# Patient Record
Sex: Female | Born: 1975 | Race: White | Hispanic: No | Marital: Single | Smoking: Never smoker
Health system: Southern US, Community
[De-identification: ages and names within clinical notes are randomized; demographics above are authoritative.]

## PROBLEM LIST (undated history)

## (undated) DIAGNOSIS — F419 Anxiety disorder, unspecified: Secondary | ICD-10-CM

## (undated) DIAGNOSIS — G4730 Sleep apnea, unspecified: Secondary | ICD-10-CM

## (undated) DIAGNOSIS — G43909 Migraine, unspecified, not intractable, without status migrainosus: Secondary | ICD-10-CM

## (undated) DIAGNOSIS — K219 Gastro-esophageal reflux disease without esophagitis: Secondary | ICD-10-CM

## (undated) DIAGNOSIS — K529 Noninfective gastroenteritis and colitis, unspecified: Secondary | ICD-10-CM

## (undated) DIAGNOSIS — K859 Acute pancreatitis without necrosis or infection, unspecified: Secondary | ICD-10-CM

## (undated) DIAGNOSIS — F32A Depression, unspecified: Secondary | ICD-10-CM

## (undated) DIAGNOSIS — F329 Major depressive disorder, single episode, unspecified: Secondary | ICD-10-CM

## (undated) HISTORY — DX: Anxiety disorder, unspecified: F41.9

## (undated) HISTORY — DX: Gastro-esophageal reflux disease without esophagitis: K21.9

## (undated) HISTORY — DX: Migraine, unspecified, not intractable, without status migrainosus: G43.909

## (undated) HISTORY — PX: CHOLECYSTECTOMY: SHX55

## (undated) HISTORY — DX: Noninfective gastroenteritis and colitis, unspecified: K52.9

## (undated) HISTORY — DX: Major depressive disorder, single episode, unspecified: F32.9

## (undated) HISTORY — PX: TUBAL LIGATION: SHX77

## (undated) HISTORY — DX: Sleep apnea, unspecified: G47.30

## (undated) HISTORY — DX: Depression, unspecified: F32.A

---

## 2001-12-13 ENCOUNTER — Encounter: Payer: Self-pay | Admitting: Internal Medicine

## 2001-12-13 ENCOUNTER — Inpatient Hospital Stay (HOSPITAL_COMMUNITY): Admission: EM | Admit: 2001-12-13 | Discharge: 2001-12-14 | Payer: Self-pay | Admitting: Emergency Medicine

## 2001-12-14 ENCOUNTER — Inpatient Hospital Stay (HOSPITAL_COMMUNITY): Admission: EM | Admit: 2001-12-14 | Discharge: 2001-12-17 | Payer: Self-pay | Admitting: *Deleted

## 2002-01-02 ENCOUNTER — Encounter: Admission: RE | Admit: 2002-01-02 | Discharge: 2002-01-02 | Payer: Self-pay | Admitting: *Deleted

## 2002-02-15 ENCOUNTER — Encounter: Admission: RE | Admit: 2002-02-15 | Discharge: 2002-02-15 | Payer: Self-pay | Admitting: *Deleted

## 2002-05-17 ENCOUNTER — Encounter: Admission: RE | Admit: 2002-05-17 | Discharge: 2002-05-17 | Payer: Self-pay | Admitting: *Deleted

## 2003-10-29 ENCOUNTER — Other Ambulatory Visit: Admission: RE | Admit: 2003-10-29 | Discharge: 2003-10-29 | Payer: Self-pay | Admitting: Obstetrics and Gynecology

## 2005-01-07 ENCOUNTER — Encounter: Admission: RE | Admit: 2005-01-07 | Discharge: 2005-01-07 | Payer: Self-pay | Admitting: Family Medicine

## 2006-02-20 ENCOUNTER — Ambulatory Visit (HOSPITAL_COMMUNITY): Payer: Self-pay | Admitting: Psychiatry

## 2006-03-10 ENCOUNTER — Ambulatory Visit (HOSPITAL_COMMUNITY): Payer: Self-pay | Admitting: Psychiatry

## 2013-01-02 ENCOUNTER — Ambulatory Visit: Payer: Self-pay | Admitting: Obstetrics and Gynecology

## 2014-01-22 ENCOUNTER — Ambulatory Visit (INDEPENDENT_AMBULATORY_CARE_PROVIDER_SITE_OTHER): Payer: BC Managed Care – PPO | Admitting: Psychology

## 2014-01-22 DIAGNOSIS — F411 Generalized anxiety disorder: Secondary | ICD-10-CM

## 2014-01-22 DIAGNOSIS — F329 Major depressive disorder, single episode, unspecified: Secondary | ICD-10-CM

## 2014-01-22 DIAGNOSIS — F3289 Other specified depressive episodes: Secondary | ICD-10-CM

## 2014-01-23 ENCOUNTER — Ambulatory Visit (HOSPITAL_COMMUNITY): Payer: Self-pay | Admitting: Psychology

## 2014-01-29 ENCOUNTER — Encounter (HOSPITAL_COMMUNITY): Payer: Self-pay | Admitting: Psychology
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
## 2014-02-11 ENCOUNTER — Encounter (HOSPITAL_COMMUNITY): Payer: Self-pay | Admitting: Psychology

## 2014-02-11 ENCOUNTER — Ambulatory Visit (INDEPENDENT_AMBULATORY_CARE_PROVIDER_SITE_OTHER): Payer: BC Managed Care – PPO | Admitting: Psychology

## 2014-02-11 DIAGNOSIS — F411 Generalized anxiety disorder: Secondary | ICD-10-CM | POA: Insufficient documentation

## 2014-02-11 NOTE — Progress Notes (Signed)
   PROGRESS NOTE  Patient:  Tonya Love   DOB: 27-Mar-1976  MR Number: [id number]  Location: BEHAVIORAL [facility] [address] [place] [address] Dept: [phone]  Start: 10 AM End: 11 AM  Provider/Observer:     Hershal Coria PSYD  Chief Complaint:      Chief Complaint  Patient presents with  . Anxiety  . Depression    Reason For Service:    The patient was self referred because of issues of stress and the reduction of anxiety and depressive symptoms. She reports that she feels like everything in her life distressing her and there is a lot of stress do to life changes at home and at work. The patient reports that she is not very happy in her marriage and her home life. She reports that she and her husband have been together for 13 years and have been married for the past 5 years. She is knowledge is and has told him about several extramarital affairs. They have address this the best they can talk about various options to deal with this but she reports that when she gets anxious or depressed she ends up getting involved in extramarital relationships.      Interventions Strategy:  Cognitive/behavioral psychotherapeutic interventions  Participation Level:   Active  Participation Quality:  Appropriate      Behavioral Observation:  Well Groomed, Alert, and Appropriate.   Current Psychosocial Factors: The patient reports that her husband doing little bit better recently and that he has been more attentive. The patient reports that she does not really know why he is making changes other than he is worried about her bleeding.  Content of Session:   Reviewed current symptoms and continue to work on therapeutic interventions or issues related to anxiety and depressive symptoms. These include but foundational issues such as diet physical activity and sleep patterns as well as working on  cognitive/behavioral interventions around building better coping resources.  Current Status:   The patient reports that her anxiety little bit better recently but she continues to work 2 jobs and works nearly 90 hours a week.  Patient Progress:   Stable  Target Goals:   Target goals include reducing the intensity, severity, and duration of issues of anxiety and depression.  Last Reviewed:   02/11/2014  Goals Addressed Today:    Today we worked on coping skills around thinking about and dealing with life stressors and more effectively and simply avoiding.  Impression/Diagnosis:  The patient has a history of depression and anxiety as well as disruptive behaviors. She denies any particular manic episodes and does appear that anxiety and depression the primary symptoms.   Diagnosis:    Axis I: Anxiety state, unspecified   RODENBOUGH,JOHN R, PsyD 02/11/2014

## 2014-03-05 ENCOUNTER — Ambulatory Visit (HOSPITAL_COMMUNITY): Payer: Self-pay | Admitting: Psychology

## 2014-04-15 ENCOUNTER — Ambulatory Visit (INDEPENDENT_AMBULATORY_CARE_PROVIDER_SITE_OTHER): Payer: BC Managed Care – PPO | Admitting: Psychology

## 2014-04-15 DIAGNOSIS — F411 Generalized anxiety disorder: Secondary | ICD-10-CM

## 2014-04-16 ENCOUNTER — Encounter (HOSPITAL_COMMUNITY): Payer: Self-pay | Admitting: Psychology
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
## 2014-05-08 ENCOUNTER — Ambulatory Visit (HOSPITAL_COMMUNITY): Payer: Self-pay | Admitting: Psychology

## 2014-05-12 ENCOUNTER — Ambulatory Visit (INDEPENDENT_AMBULATORY_CARE_PROVIDER_SITE_OTHER): Payer: BC Managed Care – PPO | Admitting: Psychology

## 2014-05-12 DIAGNOSIS — F411 Generalized anxiety disorder: Secondary | ICD-10-CM

## 2014-06-30 ENCOUNTER — Encounter (HOSPITAL_COMMUNITY): Payer: Self-pay | Admitting: Psychology
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
## 2014-12-25 ENCOUNTER — Encounter (HOSPITAL_COMMUNITY): Payer: Self-pay | Admitting: Emergency Medicine

## 2014-12-25 ENCOUNTER — Emergency Department (HOSPITAL_COMMUNITY): Payer: BLUE CROSS/BLUE SHIELD

## 2014-12-25 ENCOUNTER — Emergency Department (HOSPITAL_COMMUNITY)
Admission: EM | Admit: 2014-12-25 | Discharge: 2014-12-25 | Disposition: A | Discharge status: Home / Self Care | Payer: BLUE CROSS/BLUE SHIELD | Attending: Emergency Medicine | Admitting: Emergency Medicine

## 2014-12-25 DIAGNOSIS — K5712 Diverticulitis of small intestine without perforation or abscess without bleeding: Secondary | ICD-10-CM

## 2014-12-25 DIAGNOSIS — Z8659 Personal history of other mental and behavioral disorders: Secondary | ICD-10-CM | POA: Diagnosis not present

## 2014-12-25 DIAGNOSIS — R1030 Lower abdominal pain, unspecified: Secondary | ICD-10-CM

## 2014-12-25 DIAGNOSIS — Z3202 Encounter for pregnancy test, result negative: Secondary | ICD-10-CM | POA: Insufficient documentation

## 2014-12-25 LAB — URINALYSIS, ROUTINE W REFLEX MICROSCOPIC
Bilirubin Urine: NEGATIVE
Glucose, UA: NEGATIVE mg/dL
Hgb urine dipstick: NEGATIVE
Ketones, ur: NEGATIVE mg/dL
Leukocytes, UA: NEGATIVE
Nitrite: NEGATIVE
Protein, ur: 30 mg/dL — AB
Specific Gravity, Urine: 1.015 (ref 1.005–1.030)
Urobilinogen, UA: 0.2 mg/dL (ref 0.0–1.0)
pH: 8.5 — ABNORMAL HIGH (ref 5.0–8.0)

## 2014-12-25 LAB — COMPREHENSIVE METABOLIC PANEL
ALT: 25 U/L (ref 0–35)
AST: 27 U/L (ref 0–37)
Albumin: 3.9 g/dL (ref 3.5–5.2)
Alkaline Phosphatase: 87 U/L (ref 39–117)
Anion gap: 7 (ref 5–15)
BUN: 9 mg/dL (ref 6–23)
CO2: 27 mmol/L (ref 19–32)
Calcium: 9.1 mg/dL (ref 8.4–10.5)
Chloride: 103 mmol/L (ref 96–112)
Creatinine, Ser: 0.88 mg/dL (ref 0.50–1.10)
GFR calc Af Amer: >90 mL/min (ref >90)
GFR calc non Af Amer: 83 mL/min — ABNORMAL LOW (ref >90)
Glucose, Bld: 105 mg/dL — ABNORMAL HIGH (ref 70–99)
Potassium: 4 mmol/L (ref 3.5–5.1)
Sodium: 137 mmol/L (ref 135–145)
Total Bilirubin: 1 mg/dL (ref 0.3–1.2)
Total Protein: 7.4 g/dL (ref 6.0–8.3)

## 2014-12-25 LAB — CBC WITH DIFFERENTIAL/PLATELET
Basophils Absolute: 0 10*3/uL (ref 0.0–0.1)
Basophils Relative: 0 % (ref 0–1)
Eosinophils Absolute: 0.1 10*3/uL (ref 0.0–0.7)
Eosinophils Relative: 0 % (ref 0–5)
HCT: 33.3 % — ABNORMAL LOW (ref 36.0–46.0)
Hemoglobin: 10.9 g/dL — ABNORMAL LOW (ref 12.0–15.0)
Lymphocytes Relative: 12 % (ref 12–46)
Lymphs Abs: 1.7 10*3/uL (ref 0.7–4.0)
MCH: 31.6 pg (ref 26.0–34.0)
MCHC: 32.7 g/dL (ref 30.0–36.0)
MCV: 96.5 fL (ref 78.0–100.0)
Monocytes Absolute: 1 10*3/uL (ref 0.1–1.0)
Monocytes Relative: 7 % (ref 3–12)
Neutro Abs: 11.1 10*3/uL — ABNORMAL HIGH (ref 1.7–7.7)
Neutrophils Relative %: 81 % — ABNORMAL HIGH (ref 43–77)
Platelets: 320 10*3/uL (ref 150–400)
RBC: 3.45 MIL/uL — ABNORMAL LOW (ref 3.87–5.11)
RDW: 12.1 % (ref 11.5–15.5)
WBC: 13.8 10*3/uL — ABNORMAL HIGH (ref 4.0–10.5)

## 2014-12-25 LAB — URINE MICROSCOPIC-ADD ON

## 2014-12-25 LAB — PREGNANCY, URINE: PREG TEST UR: NEGATIVE

## 2014-12-25 MED ORDER — METRONIDAZOLE IN NACL 5-0.79 MG/ML-% IV SOLN
500.0000 mg | Freq: Once | INTRAVENOUS | Status: AC
  Administered 2014-12-25: 500 mg via INTRAVENOUS
  Filled 2014-12-25: qty 100

## 2014-12-25 MED ORDER — SODIUM CHLORIDE 0.9 % IV BOLUS (SEPSIS)
1000.0000 mL | Freq: Once | INTRAVENOUS | Status: AC
  Administered 2014-12-25: 1000 mL via INTRAVENOUS

## 2014-12-25 MED ORDER — ONDANSETRON HCL 4 MG/2ML IJ SOLN
4.0000 mg | Freq: Once | INTRAMUSCULAR | Status: AC
  Administered 2014-12-25: 4 mg via INTRAVENOUS
  Filled 2014-12-25: qty 2

## 2014-12-25 MED ORDER — OXYCODONE-ACETAMINOPHEN 5-325 MG PO TABS: 1.0000 | ORAL_TABLET | ORAL | Status: DC | PRN

## 2014-12-25 MED ORDER — MORPHINE SULFATE 4 MG/ML IJ SOLN
4.0000 mg | Freq: Once | INTRAMUSCULAR | Status: AC
  Administered 2014-12-25: 4 mg via INTRAVENOUS
  Filled 2014-12-25: qty 1

## 2014-12-25 MED ORDER — IOHEXOL 300 MG/ML  SOLN
100.0000 mL | Freq: Once | INTRAMUSCULAR | Status: AC | PRN
  Administered 2014-12-25: 100 mL via INTRAVENOUS

## 2014-12-25 MED ORDER — CIPROFLOXACIN IN D5W 400 MG/200ML IV SOLN
400.0000 mg | Freq: Once | INTRAVENOUS | Status: AC
  Administered 2014-12-25: 400 mg via INTRAVENOUS
  Filled 2014-12-25: qty 200

## 2014-12-25 MED ORDER — ONDANSETRON HCL 8 MG PO TABS
8.0000 mg | ORAL_TABLET | ORAL | Status: DC | PRN
Start: 2014-12-25 — End: 2019-11-04

## 2014-12-25 MED ORDER — CIPROFLOXACIN HCL 500 MG PO TABS: 500.0000 mg | ORAL_TABLET | Freq: Two times a day (BID) | ORAL | Status: DC

## 2014-12-25 MED ORDER — METRONIDAZOLE 500 MG PO TABS: 500.0000 mg | ORAL_TABLET | Freq: Four times a day (QID) | ORAL | Status: DC

## 2014-12-25 MED ORDER — CEFTRIAXONE SODIUM 1 G IJ SOLR
1.0000 g | Freq: Once | INTRAMUSCULAR | Status: AC
  Administered 2014-12-25: 1 g via INTRAVENOUS
  Filled 2014-12-25: qty 10

## 2014-12-25 MED ORDER — IOHEXOL 300 MG/ML  SOLN
50.0000 mL | Freq: Once | INTRAMUSCULAR | Status: AC | PRN
  Administered 2014-12-25: 50 mL via ORAL

## 2014-12-25 NOTE — ED Provider Notes (Signed)
CSN: [id number]     Arrival date & time 12/25/14  0707 History  This chart was scribed for Tonya HutchingBrian Sama Arauz, MD by Carl Bestelina Holson, ED Scribe. This patient was seen in room APA05/APA05 and the patient's care was started at 8:03 AM.    Chief Complaint  Patient presents with  . Abdominal Pain    Patient is a 39 y.o. female presenting with abdominal pain. The history is provided by the patient. No language interpreter was used.  Abdominal Pain Associated symptoms: no vaginal bleeding    HPI Comments: Tonya Love is a 39 y.o. female with a history of ovarian cysts who presents to the Emergency Department complaining of constant bilateral lower abdominal pain that started last night.  When she woke up this morning her pain had worsened and she felt shaky.  She states that she went into the shower and remembers her aunt waking her up stating that she had passed out.  She ate yesterday at lunch and had a snack last night.  Her LNMP was in February and she has a history of tubal ligation.  She denies vaginal bleeding as an associated symptom.  She had a transvaginal u/s recently which revealed ovarian cysts.  [facility]. Pricilla [facility]ssonBuester at [facility] [place] gave her birth control pills in an attempt to shrink the cysts.  She does not have a history of health problems and does not take any medication chronically.  She works as a Therapist, [employer] receiver.    Past Medical History  Diagnosis Date  . Anxiety   . Depression    Past Surgical History  Procedure Laterality Date  . Cholecystectomy     Family History  Problem Relation Age of Onset  . Depression Maternal Aunt    History  Substance Use Topics  . Smoking status: Never Smoker   . Smokeless tobacco: Never Used  . Alcohol Use: No   OB History    Gravida Para Term Preterm AB TAB SAB Ectopic Multiple Living            2     Review of Systems  Gastrointestinal: Positive for abdominal pain.  Genitourinary: Negative for vaginal bleeding.  Neurological: Positive for  syncope.  All other systems reviewed and are negative.    Allergies  Codeine  Home Medications   Prior to Admission medications   Medication Sig Start Date End Date Taking? Authorizing Provider  ibuprofen (ADVIL,MOTRIN) 200 MG tablet Take 600 mg by mouth every 6 (six) hours as needed for headache.   Yes Historical Provider, MD  ciprofloxacin (CIPRO) 500 MG tablet Take 1 tablet (500 mg total) by mouth 2 (two) times daily. 12/25/14   Tonya HutchingBrian Emerick Weatherly, MD  metroNIDAZOLE (FLAGYL) 500 MG tablet Take 1 tablet (500 mg total) by mouth 4 (four) times daily. 12/25/14   Tonya HutchingBrian Tajanay Hurley, MD  ondansetron (ZOFRAN) 8 MG tablet Take 1 tablet (8 mg total) by mouth every 4 (four) hours as needed. 12/25/14   Tonya HutchingBrian Dreanna Kyllo, MD  oxyCODONE-acetaminophen (PERCOCET) 5-325 MG per tablet Take 1-2 tablets by mouth every 4 (four) hours as needed. 12/25/14   Tonya HutchingBrian Eunique Balik, MD   Triage Vitals: BP 125/72 mmHg  Pulse 95  Temp(Src) 98.7 F (37.1 C) (Oral)  Resp 18  Ht 5\' 2"  (1.575 m)  Wt 144 lb (65.318 kg)  BMI 26.33 kg/m2  SpO2 96%  LMP 11/12/2014  Physical Exam  Constitutional: She is oriented to person, place, and time. She appears well-developed and well-nourished.  HENT:  Head: Normocephalic  and atraumatic.  Eyes: Conjunctivae and EOM are normal. Pupils are equal, round, and reactive to light.  Neck: Normal range of motion. Neck supple.  Cardiovascular: Normal rate and regular rhythm.   Pulmonary/Chest: Effort normal and breath sounds normal.  Abdominal: Soft. Bowel sounds are normal. There is tenderness.  Minimal lower abdominal tenderness.    Musculoskeletal: Normal range of motion.  Neurological: She is alert and oriented to person, place, and time.  Skin: Skin is warm and dry.  Psychiatric: She has a normal mood and affect. Her behavior is normal.  Nursing note and vitals reviewed.   ED Course  Procedures (including critical care time)  DIAGNOSTIC STUDIES: Oxygen Saturation is 96% on room air, adequate by my  interpretation.    COORDINATION OF CARE: 8:08 AM- Will obtain an EKG, UA, and blood work and the patient agreed to the treatment plan.   Labs Review Labs Reviewed  URINALYSIS, ROUTINE W REFLEX MICROSCOPIC - Abnormal; Notable for the following:    APPearance HAZY (*)    pH 8.5 (*)    Protein, ur 30 (*)    All other components within normal limits  CBC WITH DIFFERENTIAL/PLATELET - Abnormal; Notable for the following:    WBC 13.8 (*)    RBC 3.45 (*)    Hemoglobin 10.9 (*)    HCT 33.3 (*)    Neutrophils Relative % 81 (*)    Neutro Abs 11.1 (*)    All other components within normal limits  COMPREHENSIVE METABOLIC PANEL - Abnormal; Notable for the following:    Glucose, Bld 105 (*)    GFR calc non Af Amer 83 (*)    All other components within normal limits  URINE MICROSCOPIC-ADD ON - Abnormal; Notable for the following:    Squamous Epithelial / LPF MANY (*)    Bacteria, UA MANY (*)    All other components within normal limits  URINE CULTURE  PREGNANCY, URINE    Imaging Review Ct Abdomen Pelvis W Contrast  12/25/2014   CLINICAL DATA:  Bilateral lower abdominal and pelvic pain, elevated white count  EXAM: CT ABDOMEN AND PELVIS WITH CONTRAST  TECHNIQUE: Multidetector CT imaging of the abdomen and pelvis was performed using the standard protocol following bolus administration of intravenous contrast.  CONTRAST:  50mL OMNIPAQUE IOHEXOL 300 MG/ML SOLN, OMNIPAQUE IOHEXOL 300 MG/ML SOLN  COMPARISON:  None.  FINDINGS: Lower chest: Minimal bibasilar atelectasis. Lung bases otherwise clear. Normal heart size. No pericardial or pleural effusion. No hiatal hernia.  Abdomen: Prior cholecystectomy. Liver, biliary system, pancreas, spleen, adrenal glands, and kidneys are within normal limits for age and demonstrate no acute finding.  Negative for bowel obstruction, dilatation, ileus, or free air.  No abdominal free fluid, fluid collection, hemorrhage, abscess, or adenopathy.  Normal aorta.  Negative  for aneurysm.  Normal appendix demonstrated.  Pelvis: Anterior pelvic edema and inflammation about the sigmoid colon. There is diffuse sigmoid diverticular change. Findings compatible with acute sigmoid diverticulitis. Small amount of pelvic free fluid. No fluid collection. No associated obstruction or definite free air. Uterus and adnexal normal in size. Endometrial cavity free fluid noted, nonspecific. Urinary bladder unremarkable. No pelvic adenopathy, inguinal abnormality, or hernia.  Degenerative disc disease and vacuum disc phenomenon at L5-S1. No acute osseous finding.  IMPRESSION: Acute sigmoid diverticulitis within the anterior pelvic midline.  Small amount of pelvic free fluid likely reactive.  No associated perforation, obstruction, or abscess   Electronically Signed   By: Judie Petit.  Shick M.D.  On: 12/25/2014 12:34     EKG Interpretation   Date/Time:  Thursday December 25 2014 08:06:36 EDT Ventricular Rate:  94 PR Interval:  155 QRS Duration: 92 QT Interval:  340 QTC Calculation: 425 R Axis:   81 Text Interpretation:  Sinus rhythm RSR' in V1 or V2, probably normal  variant Baseline wander in lead(s) V2 Confirmed by Averill Pons  MD, Rogen Porte ([id number])  on 12/25/2014 8:33:24 AM      MDM   Final diagnoses:  Lower abdominal pain  Diverticulitis of small intestine without perforation or abscess without bleeding    CT scan of abdomen/pelvis reveals sigmoid diverticulitis. No perforation or abscess. Rx IV Cipro, IV Flagyl. Discharge medications oral Cipro, oral Flagyl, Percocet, Zofran 8 mg. Discussed findings with the patient, her husband, her mother. She understands to return if worse.  I personally performed the services described in this documentation, which was scribed in my presence. The recorded information has been reviewed and is accurate.    Tonya Hutching, MD 12/25/14 [phone]

## 2014-12-25 NOTE — ED Notes (Signed)
PT c/o lower abdominal pain with increased urinary frequency and nausea but denies any vomiting or diarrhea. PT also states a syncopal episode this am in the shower and states she thinks she hit her head.

## 2014-12-25 NOTE — Discharge Instructions (Signed)
CT scan shows diverticulitis. Prescriptions for 2 antibiotics. Also medication for pain and nausea. Clear liquids for next 24 hours. Return if worse.

## 2014-12-26 LAB — URINE CULTURE: Colony Count: 70000

## 2015-01-23 ENCOUNTER — Encounter (HOSPITAL_COMMUNITY): Payer: Self-pay

## 2015-01-23 ENCOUNTER — Emergency Department (HOSPITAL_COMMUNITY)
Admission: EM | Admit: 2015-01-23 | Discharge: 2015-01-23 | Disposition: A | Discharge status: Home / Self Care | Payer: BLUE CROSS/BLUE SHIELD | Attending: Emergency Medicine | Admitting: Emergency Medicine

## 2015-01-23 DIAGNOSIS — Z792 Long term (current) use of antibiotics: Secondary | ICD-10-CM | POA: Diagnosis not present

## 2015-01-23 DIAGNOSIS — R1032 Left lower quadrant pain: Secondary | ICD-10-CM | POA: Diagnosis present

## 2015-01-23 DIAGNOSIS — Z8659 Personal history of other mental and behavioral disorders: Secondary | ICD-10-CM | POA: Insufficient documentation

## 2015-01-23 DIAGNOSIS — Z9049 Acquired absence of other specified parts of digestive tract: Secondary | ICD-10-CM | POA: Insufficient documentation

## 2015-01-23 DIAGNOSIS — K5792 Diverticulitis of intestine, part unspecified, without perforation or abscess without bleeding: Secondary | ICD-10-CM | POA: Diagnosis not present

## 2015-01-23 DIAGNOSIS — Z3202 Encounter for pregnancy test, result negative: Secondary | ICD-10-CM | POA: Diagnosis not present

## 2015-01-23 HISTORY — DX: Acute pancreatitis without necrosis or infection, unspecified: K85.90

## 2015-01-23 LAB — CBC WITH DIFFERENTIAL/PLATELET
Basophils Absolute: 0 10*3/uL (ref 0.0–0.1)
Basophils Relative: 0 % (ref 0–1)
EOS PCT: 1 % (ref 0–5)
Eosinophils Absolute: 0.1 10*3/uL (ref 0.0–0.7)
HCT: 31.2 % — ABNORMAL LOW (ref 36.0–46.0)
HEMOGLOBIN: 10.1 g/dL — AB (ref 12.0–15.0)
LYMPHS ABS: 3.1 10*3/uL (ref 0.7–4.0)
LYMPHS PCT: 27 % (ref 12–46)
MCH: 31.6 pg (ref 26.0–34.0)
MCHC: 32.4 g/dL (ref 30.0–36.0)
MCV: 97.5 fL (ref 78.0–100.0)
MONO ABS: 0.6 10*3/uL (ref 0.1–1.0)
Monocytes Relative: 6 % (ref 3–12)
Neutro Abs: 7.8 10*3/uL — ABNORMAL HIGH (ref 1.7–7.7)
Neutrophils Relative %: 66 % (ref 43–77)
PLATELETS: 291 10*3/uL (ref 150–400)
RBC: 3.2 MIL/uL — AB (ref 3.87–5.11)
RDW: 12.5 % (ref 11.5–15.5)
WBC: 11.6 10*3/uL — AB (ref 4.0–10.5)

## 2015-01-23 LAB — COMPREHENSIVE METABOLIC PANEL
ALT: 23 U/L (ref 14–54)
ANION GAP: 7 (ref 5–15)
AST: 22 U/L (ref 15–41)
Albumin: 3.8 g/dL (ref 3.5–5.0)
Alkaline Phosphatase: 58 U/L (ref 38–126)
BILIRUBIN TOTAL: 0.5 mg/dL (ref 0.3–1.2)
BUN: 11 mg/dL (ref 6–20)
CO2: 26 mmol/L (ref 22–32)
CREATININE: 0.8 mg/dL (ref 0.44–1.00)
Calcium: 8.7 mg/dL — ABNORMAL LOW (ref 8.9–10.3)
Chloride: 106 mmol/L (ref 101–111)
GFR calc Af Amer: >60 mL/min (ref >60)
GFR calc non Af Amer: >60 mL/min (ref >60)
Glucose, Bld: 102 mg/dL — ABNORMAL HIGH (ref 70–99)
Potassium: 2.9 mmol/L — ABNORMAL LOW (ref 3.5–5.1)
Sodium: 139 mmol/L (ref 135–145)
Total Protein: 6.7 g/dL (ref 6.5–8.1)

## 2015-01-23 LAB — URINE MICROSCOPIC-ADD ON

## 2015-01-23 LAB — URINALYSIS, ROUTINE W REFLEX MICROSCOPIC
Bilirubin Urine: NEGATIVE
GLUCOSE, UA: NEGATIVE mg/dL
Ketones, ur: NEGATIVE mg/dL
Leukocytes, UA: NEGATIVE
NITRITE: NEGATIVE
PH: 6.5 (ref 5.0–8.0)
Protein, ur: NEGATIVE mg/dL
Specific Gravity, Urine: 1.015 (ref 1.005–1.030)
Urobilinogen, UA: 0.2 mg/dL (ref 0.0–1.0)

## 2015-01-23 LAB — PREGNANCY, URINE: PREG TEST UR: NEGATIVE

## 2015-01-23 MED ORDER — HYDROCODONE-ACETAMINOPHEN 5-325 MG PO TABS: 2.0000 | ORAL_TABLET | ORAL | Status: DC | PRN

## 2015-01-23 MED ORDER — CIPROFLOXACIN HCL 500 MG PO TABS: 500.0000 mg | ORAL_TABLET | Freq: Two times a day (BID) | ORAL | Status: DC

## 2015-01-23 MED ORDER — CIPROFLOXACIN HCL 250 MG PO TABS
500.0000 mg | ORAL_TABLET | Freq: Once | ORAL | Status: AC
  Administered 2015-01-23: 500 mg via ORAL
  Filled 2015-01-23: qty 2

## 2015-01-23 MED ORDER — METRONIDAZOLE 500 MG PO TABS
500.0000 mg | ORAL_TABLET | Freq: Once | ORAL | Status: AC
  Administered 2015-01-23: 500 mg via ORAL
  Filled 2015-01-23: qty 1

## 2015-01-23 MED ORDER — METRONIDAZOLE 500 MG PO TABS: 500.0000 mg | ORAL_TABLET | Freq: Three times a day (TID) | ORAL | Status: DC

## 2015-01-23 NOTE — Discharge Instructions (Signed)
Cipro and Flagyl as prescribed.  Hydrocodone as prescribed as needed for pain.  Return to the ER for worsening pain, high fever, bloody stools, or other new and concerning symptoms.   Diverticulitis Diverticulitis is inflammation or infection of small pouches in your colon that form when you have a condition called diverticulosis. The pouches in your colon are called diverticula. Your colon, or large intestine, is where water is absorbed and stool is formed. Complications of diverticulitis can include:  Bleeding.  Severe infection.  Severe pain.  Perforation of your colon.  Obstruction of your colon. CAUSES  Diverticulitis is caused by bacteria. Diverticulitis happens when stool becomes trapped in diverticula. This allows bacteria to grow in the diverticula, which can lead to inflammation and infection. RISK FACTORS People with diverticulosis are at risk for diverticulitis. Eating a diet that does not include enough fiber from fruits and vegetables may make diverticulitis more likely to develop. SYMPTOMS  Symptoms of diverticulitis may include:  Abdominal pain and tenderness. The pain is normally located on the left side of the abdomen, but may occur in other areas.  Fever and chills.  Bloating.  Cramping.  Nausea.  Vomiting.  Constipation.  Diarrhea.  Blood in your stool. DIAGNOSIS  Your health care provider will ask you about your medical history and do a physical exam. You may need to have tests done because many medical conditions can cause the same symptoms as diverticulitis. Tests may include:  Blood tests.  Urine tests.  Imaging tests of the abdomen, including X-rays and CT scans. When your condition is under control, your health care provider may recommend that you have a colonoscopy. A colonoscopy can show how severe your diverticula are and whether something else is causing your symptoms. TREATMENT  Most cases of diverticulitis are mild and can be  treated at home. Treatment may include:  Taking over-the-counter pain medicines.  Following a clear liquid diet.  Taking antibiotic medicines by mouth for 7-10 days. More severe cases may be treated at a hospital. Treatment may include:  Not eating or drinking.  Taking prescription pain medicine.  Receiving antibiotic medicines through an IV tube.  Receiving fluids and nutrition through an IV tube.  Surgery. HOME CARE INSTRUCTIONS   Follow your health care provider's instructions carefully.  Follow a full liquid diet or other diet as directed by your health care provider. After your symptoms improve, your health care provider may tell you to change your diet. He or she may recommend you eat a high-fiber diet. Fruits and vegetables are good sources of fiber. Fiber makes it easier to pass stool.  Take fiber supplements or probiotics as directed by your health care provider.  Only take medicines as directed by your health care provider.  Keep all your follow-up appointments. SEEK MEDICAL CARE IF:   Your pain does not improve.  You have a hard time eating food.  Your bowel movements do not return to normal. SEEK IMMEDIATE MEDICAL CARE IF:   Your pain becomes worse.  Your symptoms do not get better.  Your symptoms suddenly get worse.  You have a fever.  You have repeated vomiting.  You have bloody or black, tarry stools. MAKE SURE YOU:   Understand these instructions.  Will watch your condition.  Will get help right away if you are not doing well or get worse. Document Released: 06/15/2005 Document Revised: 09/10/2013 Document Reviewed: 07/31/2013 [facility] Patient Information 2015 [place]. This information is not intended to replace advice given to  you by your health care provider. Make sure you discuss any questions you have with your health care provider.

## 2015-01-23 NOTE — ED Provider Notes (Signed)
CSN: [id number]     Arrival date & time 01/23/15  0119 History   First MD Initiated Contact [phone] Patient 01/23/15 0134     Chief Complaint  Patient presents with  . Abdominal Pain     (Consider location/radiation/quality/duration/timing/severity/associated sxs/prior Treatment) HPI Comments: Patient is a 39 year old female with a past medical history of diverticulitis. She presents for evaluation of left lower quadrant and suprapubic abdominal pain that started several hours prior to presentation. She denies any bloody stool, urinary complaints, fevers, or chills. She had a similar episode approximately one month ago and was found to have diverticulitis by CT scan. She was treated successfully with Cipro and Flagyl. The pain she is experiencing tonight is identical to what she experienced one month ago with this prior flareup.  Patient is a 39 y.o. female presenting with abdominal pain. The history is provided by the patient.  Abdominal Pain Pain location:  LLQ and suprapubic Pain quality: cramping   Pain radiates to:  Does not radiate Pain severity:  Moderate Onset quality:  Sudden Duration:  6 hours Timing:  Constant Progression:  Worsening Chronicity:  Recurrent Relieved by:  Nothing Worsened by:  Nothing tried   Past Medical History  Diagnosis Date  . Anxiety   . Depression   . Pancreatitis    Past Surgical History  Procedure Laterality Date  . Cholecystectomy     Family History  Problem Relation Age of Onset  . Depression Maternal Aunt    History  Substance Use Topics  . Smoking status: Never Smoker   . Smokeless tobacco: Never Used  . Alcohol Use: No   OB History    Gravida Para Term Preterm AB TAB SAB Ectopic Multiple Living            2     Review of Systems  Gastrointestinal: Positive for abdominal pain.  All other systems reviewed and are negative.     Allergies  Codeine  Home Medications   Prior to Admission medications   Medication Sig Start  Date End Date Taking? Authorizing Provider  ibuprofen (ADVIL,MOTRIN) 200 MG tablet Take 600 mg by mouth every 6 (six) hours as needed for headache.   Yes Historical Provider, MD  ciprofloxacin (CIPRO) 500 MG tablet Take 1 tablet (500 mg total) by mouth 2 (two) times daily. 12/25/14   Donnetta HutchingBrian Cook, MD  metroNIDAZOLE (FLAGYL) 500 MG tablet Take 1 tablet (500 mg total) by mouth 4 (four) times daily. 12/25/14   Donnetta HutchingBrian Cook, MD  ondansetron (ZOFRAN) 8 MG tablet Take 1 tablet (8 mg total) by mouth every 4 (four) hours as needed. 12/25/14   Donnetta HutchingBrian Cook, MD  oxyCODONE-acetaminophen (PERCOCET) 5-325 MG per tablet Take 1-2 tablets by mouth every 4 (four) hours as needed. 12/25/14   Donnetta HutchingBrian Cook, MD   BP 117/78 mmHg  Pulse 87  Temp(Src) 98.3 F (36.8 C) (Oral)  Resp 20  Ht 5\' 2"  (1.575 m)  Wt 145 lb (65.772 kg)  BMI 26.51 kg/m2  SpO2 100%  LMP 01/17/2015 Physical Exam  Constitutional: She is oriented to person, place, and time. She appears well-developed and well-nourished. No distress.  HENT:  Head: Normocephalic and atraumatic.  Neck: Normal range of motion. Neck supple.  Cardiovascular: Normal rate and regular rhythm.  Exam reveals no gallop and no friction rub.   No murmur heard. Pulmonary/Chest: Effort normal and breath sounds normal. No respiratory distress. She has no wheezes.  Abdominal: Soft. Bowel sounds are normal. She exhibits no distension. There  is tenderness. There is no rebound and no guarding.  There is tenderness to palpation in the left lower quadrant and suprapubic region.  Musculoskeletal: Normal range of motion.  Neurological: She is alert and oriented to person, place, and time.  Skin: Skin is warm and dry. She is not diaphoretic.  Nursing note and vitals reviewed.   ED Course  Procedures (including critical care time) Labs Review Labs Reviewed  COMPREHENSIVE METABOLIC PANEL  CBC WITH DIFFERENTIAL/PLATELET  URINALYSIS, ROUTINE W REFLEX MICROSCOPIC  PREGNANCY, URINE    Imaging  Review No results found.   EKG Interpretation None      MDM   Final diagnoses:  None    Patient presents with complaints of suprapubic and left lower quadrant pain. She was diagnosed with diverticulitis one month ago and these symptoms are identical. Today's workup reveals a slight leukocytosis, however no evidence for UTI. Urine pregnancy test is negative. She will be treated for presumed diverticulitis with Cipro, Flagyl, pain medication, and when necessary return.    Geoffery Lyonsouglas Mariell Nester, MD 01/23/15 [phone]

## 2015-01-23 NOTE — ED Notes (Signed)
Lower abd pain with nausea, no vomiting or diarrhea, reports history of pancreatitis

## 2019-04-25 DIAGNOSIS — Z299 Encounter for prophylactic measures, unspecified: Secondary | ICD-10-CM | POA: Diagnosis not present

## 2019-04-25 DIAGNOSIS — M25511 Pain in right shoulder: Secondary | ICD-10-CM | POA: Diagnosis not present

## 2019-04-25 DIAGNOSIS — Z6832 Body mass index (BMI) 32.0-32.9, adult: Secondary | ICD-10-CM | POA: Diagnosis not present

## 2019-04-25 DIAGNOSIS — Z713 Dietary counseling and surveillance: Secondary | ICD-10-CM | POA: Diagnosis not present

## 2019-05-31 DIAGNOSIS — M722 Plantar fascial fibromatosis: Secondary | ICD-10-CM | POA: Diagnosis not present

## 2019-05-31 DIAGNOSIS — M79671 Pain in right foot: Secondary | ICD-10-CM | POA: Diagnosis not present

## 2019-06-18 DIAGNOSIS — G43909 Migraine, unspecified, not intractable, without status migrainosus: Secondary | ICD-10-CM | POA: Diagnosis not present

## 2019-06-18 DIAGNOSIS — E669 Obesity, unspecified: Secondary | ICD-10-CM | POA: Diagnosis not present

## 2019-06-18 DIAGNOSIS — Z2821 Immunization not carried out because of patient refusal: Secondary | ICD-10-CM | POA: Diagnosis not present

## 2019-06-18 DIAGNOSIS — Z299 Encounter for prophylactic measures, unspecified: Secondary | ICD-10-CM | POA: Diagnosis not present

## 2019-06-18 DIAGNOSIS — Z6832 Body mass index (BMI) 32.0-32.9, adult: Secondary | ICD-10-CM | POA: Diagnosis not present

## 2019-06-18 DIAGNOSIS — E061 Subacute thyroiditis: Secondary | ICD-10-CM | POA: Diagnosis not present

## 2019-06-19 DIAGNOSIS — M79671 Pain in right foot: Secondary | ICD-10-CM | POA: Diagnosis not present

## 2019-06-19 DIAGNOSIS — M722 Plantar fascial fibromatosis: Secondary | ICD-10-CM | POA: Diagnosis not present

## 2019-07-10 DIAGNOSIS — M79671 Pain in right foot: Secondary | ICD-10-CM | POA: Diagnosis not present

## 2019-07-10 DIAGNOSIS — M722 Plantar fascial fibromatosis: Secondary | ICD-10-CM | POA: Diagnosis not present

## 2019-07-19 DIAGNOSIS — Z713 Dietary counseling and surveillance: Secondary | ICD-10-CM | POA: Diagnosis not present

## 2019-07-19 DIAGNOSIS — K5732 Diverticulitis of large intestine without perforation or abscess without bleeding: Secondary | ICD-10-CM | POA: Diagnosis not present

## 2019-07-19 DIAGNOSIS — R1032 Left lower quadrant pain: Secondary | ICD-10-CM | POA: Diagnosis not present

## 2019-07-19 DIAGNOSIS — K219 Gastro-esophageal reflux disease without esophagitis: Secondary | ICD-10-CM | POA: Diagnosis not present

## 2019-07-19 DIAGNOSIS — R109 Unspecified abdominal pain: Secondary | ICD-10-CM | POA: Diagnosis not present

## 2019-07-19 DIAGNOSIS — R1110 Vomiting, unspecified: Secondary | ICD-10-CM | POA: Diagnosis not present

## 2019-07-19 DIAGNOSIS — R1031 Right lower quadrant pain: Secondary | ICD-10-CM | POA: Diagnosis not present

## 2019-07-19 DIAGNOSIS — Z6832 Body mass index (BMI) 32.0-32.9, adult: Secondary | ICD-10-CM | POA: Diagnosis not present

## 2019-07-19 DIAGNOSIS — Z299 Encounter for prophylactic measures, unspecified: Secondary | ICD-10-CM | POA: Diagnosis not present

## 2019-07-30 DIAGNOSIS — Z299 Encounter for prophylactic measures, unspecified: Secondary | ICD-10-CM | POA: Diagnosis not present

## 2019-07-30 DIAGNOSIS — Z713 Dietary counseling and surveillance: Secondary | ICD-10-CM | POA: Diagnosis not present

## 2019-07-30 DIAGNOSIS — Z6832 Body mass index (BMI) 32.0-32.9, adult: Secondary | ICD-10-CM | POA: Diagnosis not present

## 2019-07-30 DIAGNOSIS — R109 Unspecified abdominal pain: Secondary | ICD-10-CM | POA: Diagnosis not present

## 2019-08-05 ENCOUNTER — Encounter: Payer: Self-pay | Admitting: Neurology

## 2019-08-05 ENCOUNTER — Other Ambulatory Visit: Payer: Self-pay

## 2019-08-05 ENCOUNTER — Ambulatory Visit (INDEPENDENT_AMBULATORY_CARE_PROVIDER_SITE_OTHER): Payer: BC Managed Care – PPO | Admitting: Neurology

## 2019-08-05 VITALS — BP 131/83 | HR 86 | Temp 97.9°F | Ht 62.0 in | Wt 177.0 lb

## 2019-08-05 DIAGNOSIS — G4489 Other headache syndrome: Secondary | ICD-10-CM | POA: Diagnosis not present

## 2019-08-05 DIAGNOSIS — G43709 Chronic migraine without aura, not intractable, without status migrainosus: Secondary | ICD-10-CM | POA: Diagnosis not present

## 2019-08-05 MED ORDER — RIZATRIPTAN BENZOATE 10 MG PO TBDP: 10.0000 mg | ORAL_TABLET | ORAL | 11 refills | Status: DC | PRN

## 2019-08-05 NOTE — Progress Notes (Signed)
Subjective:    Patient ID: Tonya Love is a 43 y.o. female.  HPI     Huston Foley, MD, PhD [facility] [address], [address] [address] [place], [place] [address]  Dear Tonya Love,   I saw your patient, Tonya Love, upon your kind request in my neurologic clinic today for initial consultation of her recurrent headaches.  The patient is unaccompanied today.  As you know, Ms. Tonya Love is a 43 year old right-handed woman with an underlying medical history of reflux disease, anxiety, depression, history of pancreatitis and obesity, who reports recurrent migrainous headaches for years, probably since her 2s.  In the past, she had seen a neurologist, she also reports having had an MRI done over 2 decades ago which at that time was normal.  She has tried Imitrex in the past, she also tried 2 other as needed medications as she recalls but she does not actually remember the names.  It does not sound like that she was on daily preventative medication in the past.  Her migraines are typically left frontal and frontotemporal, throbbing, associated with nausea, rare vomiting, associated with photophobia, some sonophobia as well.  She remembers that Imitrex was helpful.  She also has some morning headaches which at times are less intense, more dull and achy.  She reports snoring and daytime somnolence.  She has nocturia about once or twice per average night.  She has not had an eye examination in over 2 years and sometimes has blurry vision.  She has no associated neurological accompaniments with her migraine headaches including no one-sided weakness, numbness, tingling, droopy face or slurring of speech.  She does not always get enough sleep she admits.  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  She is a non-smoker and drinks alcohol rarely, she drinks caffeine in the form of [facility], 1-1/2 bottles per day, typically 20 ounce bottle.   She admits that she does not typically drink enough water.  She does not like to drink water.  For nausea, she has tried Zofran in the past and has a prescription for Phenergan through GI. I reviewed your office note from 06/18/2019. She lives alone, she is separated.  She has 2 children, daughter age 51, son age 20, her daughter has a 55-year-old daughter.  The patient has woken up with a sense of gasping for air at times. Her Epworth sleepiness score is 12 out of 24 today, fatigue severity score is 53 out of 63.  Her migraine headache frequency is about once a week currently.   Her Past Medical History Is Significant For: Past Medical History:  Diagnosis Date  . Anxiety   . Depression   . Pancreatitis     Her Past Surgical History Is Significant For: Past Surgical History:  Procedure Laterality Date  . CHOLECYSTECTOMY      Her Family History Is Significant For: Family History  Problem Relation Age of Onset  . Depression Maternal Aunt     Her Social History Is Significant For: Social History   Socioeconomic History  . Marital status: Single    Spouse name: Not on file  . Number of children: Not on file  . Years of education: Not on file  . Highest education level: Not on file  Occupational History  . Not on file  Social Needs  . Financial resource strain: Not on file  . Food insecurity    Worry:  Not on file    Inability: Not on file  . Transportation needs    Medical: Not on file    Non-medical: Not on file  Tobacco Use  . Smoking status: Never Smoker  . Smokeless tobacco: Never Used  Substance and Sexual Activity  . Alcohol use: No  . Drug use: No  . Sexual activity: Yes    Birth control/protection: Pill  Lifestyle  . Physical activity    Days per week: Not on file    Minutes per session: Not on file  . Stress: Not on file  Relationships  . Social Herbalist on phone: Not on file    Gets together: Not on file    Attends religious service: Not on  file    Active member of club or organization: Not on file    Attends meetings of clubs or organizations: Not on file    Relationship status: Not on file  Other Topics Concern  . Not on file  Social History Narrative  . Not on file    Her Allergies Are:  Allergies  Allergen Reactions  . Codeine Nausea Only  :   Her Current Medications Are:  Outpatient Encounter Medications as of 08/05/2019  Medication Sig  . ciprofloxacin (CIPRO) 500 MG tablet Take 1 tablet (500 mg total) by mouth 2 (two) times daily. One po bid x 7 days  . ibuprofen (ADVIL,MOTRIN) 200 MG tablet Take 600 mg by mouth every 6 (six) hours as needed for headache.  . metroNIDAZOLE (FLAGYL) 500 MG tablet Take 1 tablet (500 mg total) by mouth 3 (three) times daily. One po bid x 7 days  . ondansetron (ZOFRAN) 8 MG tablet Take 1 tablet (8 mg total) by mouth every 4 (four) hours as needed.  . [DISCONTINUED] dexlansoprazole (DEXILANT) 60 MG capsule Take 60 mg by mouth daily.  . [DISCONTINUED] HYDROcodone-acetaminophen (NORCO) 5-325 MG per tablet Take 2 tablets by mouth every 4 (four) hours as needed.  . [DISCONTINUED] oxyCODONE-acetaminophen (PERCOCET) 5-325 MG per tablet Take 1-2 tablets by mouth every 4 (four) hours as needed.  . [DISCONTINUED] valACYclovir (VALTREX) 1000 MG tablet Take 1,000 mg by mouth daily.   No facility-administered encounter medications on file as of 08/05/2019.   :  Review of Systems:  Out of a complete 14 point review of systems, all are reviewed and negative with the exception of these symptoms as listed below: Review of Systems  Neurological:       Pt presents today to discuss her headaches. Pt has 20 headache days per month. Her headache can last up to 4-5 days. She has associated N/V, light and sound sensitivity with her headaches. She denies any recent head imaging. Pt has tried tylenol, ibuprofen, and imitrex in the past. Pt has never had a sleep study but does endorse snoring.  Epworth  Sleepiness Scale 0= would never doze 1= slight chance of dozing 2= moderate chance of dozing 3= high chance of dozing  Sitting and reading: 3 Watching TV: 3 Sitting inactive in a public place (ex. Theater or meeting):0 As a passenger in a car for an hour without a break: 3 Lying down to rest in the afternoon: 1 Sitting and talking to someone: 1 Sitting quietly after lunch (no alcohol): 1 In a car, while stopped in traffic: 0 Total: 12     Objective:  Neurological Exam  Physical Exam Physical Examination:   Vitals:   08/05/19 1004  BP: 131/83  Pulse: 86  Temp: 97.9 F (36.6 C)    General Examination: The patient is a very pleasant 43 y.o. female in no acute distress. She appears well-developed and well-nourished and well groomed.   HEENT: Normocephalic, atraumatic, pupils are equal, round and reactive to light and accommodation. Funduscopic exam is normal with sharp disc margins noted. Extraocular tracking is good without limitation to gaze excursion or nystagmus noted. Normal smooth pursuit is noted. Hearing is grossly intact. Speech is clear with no dysarthria noted. There is no hypophonia. There is no lip, neck/head, jaw or voice tremor. Neck is supple with full range of passive and active motion. There are no carotid bruits on auscultation. Oropharynx exam reveals: moderate mouth dryness, good dental hygiene and mild airway crowding, due to Smaller airway entry, MallampatiClass I, tonsils of 1+.  Tongue protrudes centrally in palate elevates symmetrically.   Chest: Clear to auscultation without wheezing, rhonchi or crackles noted.  Heart: S1+S2+0, regular and normal without murmurs, rubs or gallops noted.   Abdomen: Soft, non-tender and non-distended with normal bowel sounds appreciated on auscultation.  Extremities: There is no pitting edema in the distal lower extremities bilaterally. Pedal pulses are intact.  Skin: Warm and dry without trophic changes  noted.  Musculoskeletal: exam reveals no obvious joint deformities, tenderness or joint swelling or erythema.   Neurologically:  Mental status: The patient is awake, alert and oriented in all 4 spheres. Her immediate and remote memory, attention, language skills and fund of knowledge are appropriate. There is no evidence of aphasia, agnosia, apraxia or anomia. Speech is clear with normal prosody and enunciation. Thought process is linear. Mood is normal and affect is normal.  Cranial nerves II - XII are as described above under HEENT exam. In addition: shoulder shrug is normal with equal shoulder height noted. Motor exam: Normal bulk, strength and tone is noted. There is no drift, tremor or rebound. Romberg is negative. Reflexes are 2+ throughout. Babinski: Toes are flexor bilaterally. Fine motor skills and coordination: intact with normal finger taps, normal hand movements, normal rapid alternating patting, normal foot taps and normal foot agility.  Cerebellar testing: No dysmetria or intention tremor on finger to nose testing. Heel to shin is unremarkable bilaterally. There is no truncal or gait ataxia.  Sensory exam: intact to light touch, vibration, temperature sense in the upper and lower extremities.  Gait, station and balance: She stands easily. No veering to one side is noted. No leaning to one side is noted. Posture is age-appropriate and stance is narrow based. Gait shows normal stride length and normal pace. No problems turning are noted. Tandem walk is unremarkable.   Assessment and Plan:  In summary, Melven SartoriusLeone E Coello is a very pleasant 43 y.o.-year old female  with an underlying medical history of reflux disease, anxiety, depression, history of pancreatitis and obesity, who Presents for evaluation of her recurrent headaches, her history is in keeping with migraine without aura, currently not intractable but she does have a migraine about once a week.  In the past, she had seen a neurologist  as I understand over 2 decades ago, even had a brain MRI which at that time was reportedly normal.  She had some success with Imitrex in the past.  Also has sleep disturbance including daytime somnolence, morning headaches, nocturia and a sense of gasping for air at night at times.  She may have underlying sleep disordered breathing.  This can be the cause of increase in her headaches.  She is advised to proceed  with diagnostic testing in the form of brain MRI with and without contrast and a sleep study to rule out sleep apnea.  She is advised to consider CPAP or AutoPap therapy if she indeed has obstructive sleep apnea.For symptomatic management of her migraines, for as needed/abortive use, I would like to suggest Maxalt MLT.I provided a new prescription and written instructions.  She has Phenergan available for nausea management, and the future, we may consider a preventative medication for migraines.  She may even be eligible for one of the newer injectable medications such as Ajovy, Emgality, or Aimovig. I plan to see her back after her testing.  She is encouraged to call with any interim questions or concerns.  I answered all her questions today and the patient was in agreement with the plan. Thank you very much for allowing me to participate in the care of this nice patient. If I can be of any further assistance to you please do not hesitate to call me at [phone].  Sincerely,   Huston Foley, MD, PhD

## 2019-08-05 NOTE — Patient Instructions (Signed)
As discussed, for as needed use, for a migraine attack, please use Maxalt orally disintegrating tab, 10 mg: take 1 pill early on when you suspect a migraine attack come on. You may take another pill within 2 hours, no more than 2 pills in 24 hours. Most people who take triptans do not have any serious side-effects. However, they can cause drowsiness (remember to not drive or use heavy machinery when drowsy), nausea, dizziness, dry mouth. Less common side effects include strange sensations, such as tightness in your chest or throat, tingling, flushing, and feelings of heaviness or pressure in areas such as the face, limbs, and chest. These in the chest can mimic heart related pain (angina) and may cause alarm, but usually these sensations are not harmful or a sign of a heart attack. However, if you develop intense chest pain or sensations of discomfort, you should stop taking your medication and consult with me or your PCP or go to the nearest urgent care facility or ER or call 911.   We may have to consider a daily preventative medication or one of the newer injectable medication such as Aimovig, Emgality, or Ajovy.  Use Phenergan as needed for nausea, you have a prescription for it.  Please be mindful that it is potentially sedating  We will also proceed with diagnostic testing in the form of brain MRI with and without contrast and a sleep study to rule out underlying obstructive sleep apnea as you have had some morning headaches and have woken up with a sense of gasping.  You are sleepy during the day.  Please limit your caffeine intake to 16 ounce per day if possible if you like to drink a soda.  Please try to hydrate better with water, 6 to 8 cups of water are recommended per day.  Please try to achieve 7 to 8 hours of sleep.   We will do blood work today to check your kidney and liver function in order to proceed with a brain MRI with and without contrast.

## 2019-08-06 LAB — COMPREHENSIVE METABOLIC PANEL
ALT: 13 IU/L (ref 0–32)
AST: 13 IU/L (ref 0–40)
Albumin/Globulin Ratio: 1.6 (ref 1.2–2.2)
Albumin: 4.3 g/dL (ref 3.8–4.8)
Alkaline Phosphatase: 72 IU/L (ref 39–117)
BUN/Creatinine Ratio: 8 — ABNORMAL LOW (ref 9–23)
BUN: 7 mg/dL (ref 6–24)
Bilirubin Total: 0.3 mg/dL (ref 0.0–1.2)
CO2: 23 mmol/L (ref 20–29)
Calcium: 9.7 mg/dL (ref 8.7–10.2)
Chloride: 103 mmol/L (ref 96–106)
Creatinine, Ser: 0.9 mg/dL (ref 0.57–1.00)
GFR calc Af Amer: 92 mL/min/{1.73_m2} (ref >59)
GFR calc non Af Amer: 80 mL/min/{1.73_m2} (ref >59)
Globulin, Total: 2.7 g/dL (ref 1.5–4.5)
Glucose: 82 mg/dL (ref 65–99)
Potassium: 4.4 mmol/L (ref 3.5–5.2)
Sodium: 140 mmol/L (ref 134–144)
Total Protein: 7 g/dL (ref 6.0–8.5)

## 2019-08-12 ENCOUNTER — Telehealth: Payer: Self-pay

## 2019-08-13 DIAGNOSIS — M7661 Achilles tendinitis, right leg: Secondary | ICD-10-CM | POA: Diagnosis not present

## 2019-08-13 DIAGNOSIS — M79671 Pain in right foot: Secondary | ICD-10-CM | POA: Diagnosis not present

## 2019-08-19 DIAGNOSIS — R109 Unspecified abdominal pain: Secondary | ICD-10-CM | POA: Diagnosis not present

## 2019-08-19 DIAGNOSIS — M545 Low back pain: Secondary | ICD-10-CM | POA: Diagnosis not present

## 2019-08-19 DIAGNOSIS — K5792 Diverticulitis of intestine, part unspecified, without perforation or abscess without bleeding: Secondary | ICD-10-CM | POA: Diagnosis not present

## 2019-08-19 DIAGNOSIS — Z6828 Body mass index (BMI) 28.0-28.9, adult: Secondary | ICD-10-CM | POA: Diagnosis not present

## 2019-08-19 DIAGNOSIS — Z299 Encounter for prophylactic measures, unspecified: Secondary | ICD-10-CM | POA: Diagnosis not present

## 2019-08-25 ENCOUNTER — Encounter: Payer: Self-pay | Admitting: Gastroenterology

## 2019-08-25 NOTE — Progress Notes (Signed)
Referring Provider: Monico Blitz, MD Primary Care Physician:  Monico Blitz, MD Primary Gastroenterologist:  Dr. Gala Romney  Chief Complaint  Patient presents with   Nausea    occ vomiting   Diverticulitis    approx 1 1/2 months ago    HPI:   Tonya Love is a 43 y.o. female presenting today at the request of Monico Blitz, MD for diverticulitis and chronic nausea.  Review of imaging in our system with CT documented acute uncomplicated sigmoid diverticulitis in 2016.   Recently seen by neurology for further evaliation of recurrant migraines with associated nausea, rare vomiting, photophobia, and sonophobia. Plans for sleep study and brain MRI. Atarting Maxalt MLT for abortive therapy.   Today  No appetite. When she eats, she gets nauseous. Also with postprandial diarrhea.   Decrease in appetite and nausea has been present for 5-6 months. Occasional vomiting. Maybe once every couple of weeks. No hematemesis. Sometimes when bending over after eating, the pressure on her abdomen will make her feel like she will throw up. Occasional early satiety. A few times a week. This is new in the last 5-6 months. No weight loss. Has history of acid reflux. On omeprazole. Has been on this for years. Will have breakthrough symptoms occasionally if eating greasy foods or drinking orange juice. Will also have symptoms about 1-2 times a week even with avoiding known triggers. Was recently started on omeprazole 40 mg about 5 days ago. Dysphagia once every couple of weeks for the last several months. Can't tell me any certain foods that cause symptoms. Points to mid upper chest and states "it feels like it gets hung in here." Takes ibuprofen frequently for headaches. About [id number] mg daily. Taking over 1 year. Occasional dark stools. No completely black but "very dark.". Occasional bright red blood on the toilet tissue. First started 1 month ago. Occurs with most BMs. No rectal pain, burning, or hemorrhoids. No  blood on stool or in toilet water.   Has had postprandial diarrhea for several years. Will take imodium just about daily. This helps. About 1-2 BMs maybe 3 BMs daily. Stools are watery at times. Other times mushy. Hardly ever any formed stools. Occasional nocturnal stools. Not often. Does consume dairy at times. Worsens diarrhea. Worsens nausea. Some abdominal cramping at times before a BM that resolves thereafter. History of cholecystectomy many years ago but doesn't remember if diarrhea started then or years later. Recently completed cipro and flagyl for diverticulitis. Last dose about 3-4 weeks ago. No abdominal pain at this time. Reports having CT at [facility] to diagnose diverticulitis. Also had labs. Reports her hemoglobin has always been low.  Does not think she is ever completed iron studies.  Reports heavy periods at times. Last 3-4 days or 6-7 days.    Never had colonoscopy or EGD. No family history of colon cancer.   Denies fever or chills. Denies lightheadedness, dizziness, pre-syncope or syncope.   Past Medical History:  Diagnosis Date   Anxiety    Depression    Migraines    Pancreatitis     Past Surgical History:  Procedure Laterality Date   CHOLECYSTECTOMY      Current Outpatient Medications  Medication Sig Dispense Refill   ibuprofen (ADVIL,MOTRIN) 200 MG tablet Take 600 mg by mouth every 6 (six) hours as needed for headache.     omeprazole (PRILOSEC) 40 MG capsule Take 40 mg by mouth daily.     ondansetron (ZOFRAN) 8 MG tablet Take 1 tablet (  8 mg total) by mouth every 4 (four) hours as needed. 10 tablet 0   rizatriptan (MAXALT-MLT) 10 MG disintegrating tablet Take 1 tablet (10 mg total) by mouth as needed for migraine. May repeat in 2 hours if needed, no more than 2 pills in 24 hours. 9 tablet 11   pantoprazole (PROTONIX) 40 MG tablet Take 1 tablet (40 mg total) by mouth 2 (two) times daily. 60 tablet 5   polyethylene glycol-electrolytes (TRILYTE) 420 g  solution Take 4,000 mLs by mouth as directed. 4000 mL 0   No current facility-administered medications for this visit.     Allergies as of 08/26/2019 - Review Complete 08/26/2019  Allergen Reaction Noted   Codeine Nausea Only 12/25/2014    Family History  Problem Relation Age of Onset   Depression Maternal Aunt    Colon cancer Neg Hx     Social History   Socioeconomic History   Marital status: Single    Spouse name: Not on file   Number of children: Not on file   Years of education: Not on file   Highest education level: Not on file  Occupational History   Not on file  Social Needs   Financial resource strain: Not on file   Food insecurity    Worry: Not on file    Inability: Not on file   Transportation needs    Medical: Not on file    Non-medical: Not on file  Tobacco Use   Smoking status: Never Smoker   Smokeless tobacco: Never Used  Substance and Sexual Activity   Alcohol use: No   Drug use: No   Sexual activity: Yes    Birth control/protection: Pill  Lifestyle   Physical activity    Days per week: Not on file    Minutes per session: Not on file   Stress: Not on file  Relationships   Social connections    Talks on phone: Not on file    Gets together: Not on file    Attends religious service: Not on file    Active member of club or organization: Not on file    Attends meetings of clubs or organizations: Not on file    Relationship status: Not on file   Intimate partner violence    Fear of current or ex partner: Not on file    Emotionally abused: Not on file    Physically abused: Not on file    Forced sexual activity: Not on file  Other Topics Concern   Not on file  Social History Narrative   Not on file    Review of Systems: Gen: See HPI CV: Denies chest pain, heart palpitations Resp: Denies shortness of breath or cough.  GI: See HPI GU : Denies urinary burning, urinary frequency, urinary hesitancy MS: chronic back pain.    Derm: Denies rash Psych: Denies depression, anxiety Heme: See HPI  Physical Exam: BP 133/78    Pulse 97    Temp (!) 97.3 F (36.3 C) (Temporal)    Ht 5\' 2"  (1.575 m)    Wt 178 lb 3.2 oz (80.8 kg)    LMP 07/27/2019 (Approximate)    BMI 32.59 kg/m  General:   Alert and oriented. Pleasant and cooperative. Well-nourished and well-developed.  Head:  Normocephalic and atraumatic. Eyes:  Without icterus, sclera clear and conjunctiva pink.  Ears:  Normal auditory acuity. Lungs:  Clear to auscultation bilaterally. No wheezes, rales, or rhonchi. No distress.  Heart:  S1, S2 present without  murmurs appreciated.  Abdomen:  +BS, soft, and non-distended. Mild tenderness to deep palpation in the lower abdomen. No HSM noted. No guarding or rebound. No masses appreciated.  Rectal:  Deferred  Msk:  Symmetrical without gross deformities. Normal posture. Pulses:  Normal pulses noted. Extremities:  Without edema. Neurologic:  Alert and  oriented x4;  grossly normal neurologically. Skin:  Intact without significant lesions or rashes. Psych: Normal mood and affect.

## 2019-08-26 ENCOUNTER — Other Ambulatory Visit: Payer: Self-pay

## 2019-08-26 ENCOUNTER — Ambulatory Visit (INDEPENDENT_AMBULATORY_CARE_PROVIDER_SITE_OTHER): Payer: BC Managed Care – PPO | Admitting: Gastroenterology

## 2019-08-26 ENCOUNTER — Encounter: Payer: Self-pay | Admitting: Gastroenterology

## 2019-08-26 VITALS — BP 133/78 | HR 97 | Temp 97.3°F | Ht 62.0 in | Wt 178.2 lb

## 2019-08-26 DIAGNOSIS — K219 Gastro-esophageal reflux disease without esophagitis: Secondary | ICD-10-CM | POA: Insufficient documentation

## 2019-08-26 DIAGNOSIS — R112 Nausea with vomiting, unspecified: Secondary | ICD-10-CM

## 2019-08-26 DIAGNOSIS — R197 Diarrhea, unspecified: Secondary | ICD-10-CM

## 2019-08-26 DIAGNOSIS — R1310 Dysphagia, unspecified: Secondary | ICD-10-CM

## 2019-08-26 DIAGNOSIS — Z79899 Other long term (current) drug therapy: Secondary | ICD-10-CM | POA: Diagnosis not present

## 2019-08-26 DIAGNOSIS — K625 Hemorrhage of anus and rectum: Secondary | ICD-10-CM

## 2019-08-26 DIAGNOSIS — D649 Anemia, unspecified: Secondary | ICD-10-CM

## 2019-08-26 DIAGNOSIS — Z8719 Personal history of other diseases of the digestive system: Secondary | ICD-10-CM

## 2019-08-26 DIAGNOSIS — R110 Nausea: Secondary | ICD-10-CM | POA: Insufficient documentation

## 2019-08-26 MED ORDER — PEG 3350-KCL-NA BICARB-NACL 420 G PO SOLR: 4000.0000 mL | ORAL | 0 refills | Status: DC

## 2019-08-26 MED ORDER — PANTOPRAZOLE SODIUM 40 MG PO TBEC: 40.0000 mg | DELAYED_RELEASE_TABLET | Freq: Two times a day (BID) | ORAL | 5 refills | Status: DC

## 2019-08-26 NOTE — Patient Instructions (Addendum)
Have stool studies and labs completed.   We will arrange a colonoscopy and upper endoscopy with possible dilation of your esophagus in the near future with Dr. Jena Gauss.   If you have any return of abdominal pain prior to procedures, it is important to let us know as risk of colonic perforation is increased with active diverticulitis.   Stop omeprazole and start Protonix 40 mg 30 minutes before breakfast and dinner. Call if this is too expensive.   Please stop ibuprofen and use tylenol for headaches. No more than 3 g of tylenol daily. Try to avoid all NSAIDs (Advil, Aleve, ibuprofen, and goody powders).   Follow GERD diet. See handout below. Avoid fried, fatty, greasy, spicy, citrus foods. Avoid carbonated beverages, caffeine, and chocolate.  Try 4-6 small meals a day. Do not eat within 3 hours of laying down.   Follow lactose free diet. See handout below.   We are requesting records from [facility]. We will let you know when we reveice and review them.   Plan to follow up after procedures. Call if questions or concerns prior.   Ermalinda Memos, PA-C [facility]   Food Choices for Gastroesophageal Reflux Disease, Adult When you have gastroesophageal reflux disease (GERD), the foods you eat and your eating habits are very important. Choosing the right foods can help ease your discomfort. Think about working with a nutrition specialist (dietitian) to help you make good choices. What are tips for following this plan?  Meals  Choose healthy foods that are low in fat, such as fruits, vegetables, whole grains, low-fat dairy products, and lean meat, fish, and poultry.  Eat small meals often instead of 3 large meals a day. Eat your meals slowly, and in a place where you are relaxed. Avoid bending over or lying down until 2-3 hours after eating.  Avoid eating meals 2-3 hours before bed.  Avoid drinking a lot of liquid with meals.  Cook foods using methods other than frying.  Bake, grill, or broil food instead.  Avoid or limit: ? Chocolate. ? Peppermint or spearmint. ? Alcohol. ? Pepper. ? Black and decaffeinated coffee. ? Black and decaffeinated tea. ? Bubbly (carbonated) soft drinks. ? Caffeinated energy drinks and soft drinks.  Limit high-fat foods such as: ? Fatty meat or fried foods. ? Whole milk, cream, butter, or ice cream. ? Nuts and nut butters. ? Pastries, donuts, and sweets made with butter or shortening.  Avoid foods that cause symptoms. These foods may be different for everyone. Common foods that cause symptoms include: ? Tomatoes. ? Oranges, lemons, and limes. ? Peppers. ? Spicy food. ? Onions and garlic. ? Vinegar. Lifestyle  Maintain a healthy weight. Ask your doctor what weight is healthy for you. If you need to lose weight, work with your doctor to do so safely.  Exercise for at least 30 minutes for 5 or more days each week, or as told by your doctor.  Wear loose-fitting clothes.  Do not smoke. If you need help quitting, ask your doctor.  Sleep with the head of your bed higher than your feet. Use a wedge under the mattress or blocks under the bed frame to raise the head of the bed. Summary  When you have gastroesophageal reflux disease (GERD), food and lifestyle choices are very important in easing your symptoms.  Eat small meals often instead of 3 large meals a day. Eat your meals slowly, and in a place where you are relaxed.  Limit high-fat foods such as  fatty meat or fried foods.  Avoid bending over or lying down until 2-3 hours after eating.  Avoid peppermint and spearmint, caffeine, alcohol, and chocolate. This information is not intended to replace advice given to you by your health care provider. Make sure you discuss any questions you have with your health care provider. Document Released: 03/06/2012 Document Revised: 12/27/2018 Document Reviewed: 10/11/2016 Elsevier Patient Education  [place].  Lactose-Free Diet, Adult If you have lactose intolerance, you are not able to digest lactose. Lactose is a natural sugar found mainly in dairy milk and dairy products. You may need to avoid all foods and beverages that contain lactose. A lactose-free diet can help you do this. Which foods have lactose? Lactose is found in dairy milk and dairy products, such as:  Yogurt.  Cheese.  Butter.  Margarine.  Sour cream.  Cream.  Whipped toppings and nondairy creamers.  Ice cream and other dairy-based desserts. Lactose is also found in foods or products made with dairy milk or milk ingredients. To find out whether a food contains dairy milk or a milk ingredient, look at the ingredients list. Avoid foods with the statement "May contain milk" and foods that contain:  Milk powder.  Whey.  Curd.  Caseinate.  Lactose.  Lactalbumin.  Lactoglobulin. What are alternatives to dairy milk and foods made with milk products?  Lactose-free milk.  Soy milk with added calcium and vitamin D.  Almond milk, coconut milk, rice milk, or other nondairy milk alternatives with added calcium and vitamin D. Note that these are low in protein.  Soy products, such as soy yogurt, soy cheese, soy ice cream, and soy-based sour cream.  Other nut milk products, such as almond yogurt, almond cheese, cashew yogurt, cashew cheese, cashew ice cream, coconut yogurt, and coconut ice cream. What are tips for following this plan?  Do not consume foods, beverages, vitamins, minerals, or medicines containing lactose. Read ingredient lists carefully.  Look for the words "lactose-free" on labels.  Use lactase enzyme drops or tablets as directed by your health care provider.  Use lactose-free milk or a milk alternative, such as soy milk or almond milk, for drinking and cooking.  Make sure you get enough calcium and vitamin D in your diet. A lactose-free eating plan can be lacking in these important  nutrients.  Take calcium and vitamin D supplements as directed by your health care provider. Talk to your health care provider about supplements if you are not able to get enough calcium and vitamin D from food. What foods can I eat?  Fruits All fresh, canned, frozen, or dried fruits that are not processed with lactose. Vegetables All fresh, frozen, and canned vegetables without cheese, cream, or butter sauces. Grains Any that are not made with dairy milk or dairy products. Meats and other proteins Any meat, fish, poultry, and other protein sources that are not made with dairy milk or dairy products. Soy cheese and yogurt. Fats and oils Any that are not made with dairy milk or dairy products. Beverages Lactose-free milk. Soy, rice, or almond milk with added calcium and vitamin D. Fruit and vegetable juices. Sweets and desserts Any that are not made with dairy milk or dairy products. Seasonings and condiments Any that are not made with dairy milk or dairy products. Calcium Calcium is found in many foods that contain lactose and is important for bone health. The amount of calcium you need depends on your age:  Adults younger than 50 years: 1,000  mg of calcium a day.  Adults older than 50 years: 1,200 mg of calcium a day. If you are not getting enough calcium, you may get it from other sources, including:  Orange juice with calcium added. There are 300-350 mg of calcium in 1 cup of orange juice.  Calcium-fortified soy milk. There are 300-400 mg of calcium in 1 cup of calcium-fortified soy milk.  Calcium-fortified rice or almond milk. There are 300 mg of calcium in 1 cup of calcium-fortified rice or almond milk.  Calcium-fortified breakfast cereals. There are 100-1,000 mg of calcium in calcium-fortified breakfast cereals.  Spinach, cooked. There are 145 mg of calcium in  cup of cooked spinach.  Edamame, cooked. There are 130 mg of calcium in  cup of cooked edamame.  Collard  greens, cooked. There are 125 mg of calcium in  cup of cooked collard greens.  Kale, frozen or cooked. There are 90 mg of calcium in  cup of cooked or frozen kale.  Almonds. There are 95 mg of calcium in  cup of almonds.  Broccoli, cooked. There are 60 mg of calcium in 1 cup of cooked broccoli. The items listed above may not be a complete list of recommended foods and beverages. Contact a dietitian for more options. What foods are not recommended? Fruits None, unless they are made with dairy milk or dairy products. Vegetables None, unless they are made with dairy milk or dairy products. Grains Any grains that are made with dairy milk or dairy products. Meats and other proteins None, unless they are made with dairy milk or dairy products. Dairy All dairy products, including milk, goat's milk, buttermilk, kefir, acidophilus milk, flavored milk, evaporated milk, condensed milk, dulce de [place], eggnog, yogurt, cheese, and cheese spreads. Fats and oils Any that are made with milk or milk products. Margarines and salad dressings that contain milk or cheese. Cream. Half and half. Cream cheese. Sour cream. Chip dips made with sour cream or yogurt. Beverages Hot chocolate. Cocoa with lactose. Instant iced teas. Powdered fruit drinks. Smoothies made with dairy milk or yogurt. Sweets and desserts Any that are made with milk or milk products. Seasonings and condiments Chewing gum that has lactose. Spice blends if they contain lactose. Artificial sweeteners that contain lactose. Nondairy creamers. The items listed above may not be a complete list of foods and beverages to avoid. Contact a dietitian for more information. Summary  If you are lactose intolerant, it means that you have a hard time digesting lactose, a natural sugar found in milk and milk products.  Following a lactose-free diet can help you manage this condition.  Calcium is important for bone health and is found in many foods  that contain lactose. Talk with your health care provider about other sources of calcium. This information is not intended to replace advice given to you by your health care provider. Make sure you discuss any questions you have with your health care provider. Document Released: 02/25/2002 Document Revised: 10/03/2017 Document Reviewed: 10/03/2017 Elsevier Patient Education  2020 [employer]

## 2019-08-27 ENCOUNTER — Telehealth: Payer: Self-pay | Admitting: Gastroenterology

## 2019-08-27 ENCOUNTER — Encounter: Payer: Self-pay | Admitting: Gastroenterology

## 2019-08-27 DIAGNOSIS — Z79899 Other long term (current) drug therapy: Secondary | ICD-10-CM | POA: Diagnosis not present

## 2019-08-27 DIAGNOSIS — R1310 Dysphagia, unspecified: Secondary | ICD-10-CM | POA: Insufficient documentation

## 2019-08-27 DIAGNOSIS — R197 Diarrhea, unspecified: Secondary | ICD-10-CM | POA: Diagnosis not present

## 2019-08-27 NOTE — Assessment & Plan Note (Addendum)
Dysphagia symptoms occurring about once every couple of weeks for the last several months.  Feels foods get hung in the mid upper chest.  Likely secondary to uncontrolled GERD with development of esophageal web, ring, or stricture.  Cannot rule out underlying malignancy.  Proceed with EGD/ED with Dr. Gala Romney in the near future. The risks, benefits, and alternatives have been discussed in detail with patient. They have stated understanding and desire to proceed.  Follow-up after procedure.

## 2019-08-27 NOTE — Assessment & Plan Note (Addendum)
43 year old female reporting chronic postprandial diarrhea for years with occasional associated abdominal cramping prior to BMs that resolves thereafter.  Cholecystectomy many years ago. Can't remember if diarrhea started after that or years later. Taking Imodium almost daily which helps.  Having between 1-3 BMs daily.  Stools are watery at times, other times mushy.  Very rare formed stools.  Occasional nocturnal stools.  Admits that dairy worsens diarrhea.  Recently on Cipro and Flagyl for diverticulitis about 1 to 1.5 months ago.  Admits to new onset of toilet tissue hematochezia occurring with most BMs for the last month.  No true melena.  Per patient, CT abdomen and pelvis as well as labs were completed at [facility] at the time of diverticulitis diagnosis.  Reports low hemoglobin and states her hemoglobin has always been low.  Denies unintentional weight loss.  Admits to taking ibuprofen [id number] mg daily for over 1 year.   Differentials include bile salt diarrhea, IBS-D, dietary intolerances including lactose intolerance or gluten intolerance, celiac disease, thyroid abnormalities, IBD, or infectious etiology.  Chronic NSAID use may also be playing a role here.  Rectal bleeding may be secondary to hemorrhoids, anorectal irritation secondary to diarrhea, colon polyps, diverticular bleeding, malignancy.   Request records from [facility]. Complete stool studies including C. difficile and GI pathogen panel. Celiac serologies TSH Follow lactose-free diet.  Handout provided.  Patient will have TCS for rectal bleeding and post diverticulitis as discussed above.  This will also help evaluate her diarrhea. Follow-up after procedures.  Addendum: C. Diff and GI pathogen panel negative. No celiac disease. TSH normal. Prescription sent in for Bentyl 10 mg up to 4 times daily.

## 2019-08-27 NOTE — Telephone Encounter (Signed)
Tonya Love, not sure if I indicated on patients encounter form yesterday that I would like to request records from [facility]. Thanks.

## 2019-08-27 NOTE — Assessment & Plan Note (Addendum)
43 year old female with history of sigmoid diverticulitis documented by CT in 2016 with recurrent diverticulitis about 1.5 months ago treated with Cipro and Flagyl.  She reports being seen at [facility] at the time of diagnosis with CT of her abdomen and pelvis as well as blood work completed.  She reports her abdominal pain has resolved.  On exam, she had very mild tenderness to deep palpation in the lower abdomen.  Other lower GI symptoms at this time include toilet tissue hematochezia x1 month with most bowel movements and chronic postprandial diarrhea as addressed above and below. No prior colonoscopy.   Proceed with TCS with Dr. Gala Romney in the near future for follow-up of diverticulitis as well as rectal bleeding.  This will also help evaluate her diarrhea if planned workup is unrevealing.  The risks, benefits, and alternatives have been discussed in detail with patient. They have stated understanding and desire to proceed.  Patient was advised to let us know if her abdominal pain returns prior to her procedures due to increased risk of colonic perforation. Request records from [facility]. Follow-up after procedures.   Addendum: CT abdomen and pelvis with contrast 07/19/19 with focal acute diverticulitis in the sigmoid portion of the colon in the right lower quadrant.

## 2019-08-27 NOTE — Assessment & Plan Note (Addendum)
Patient reports history of anemia.  Last hemoglobin in our system from 2016 was 10.1.  Recent labs completed at [facility] about 1.5 months ago when she was diagnosed with diverticulitis.  I did not receive these records but will request them. Her diverticulitis symptoms have resolved.   Currently with new development of toilet tissue hematochezia with most bowel movements for the last month.  Also with decreased appetite, nausea, occasional vomiting, uncontrolled GERD, dysphagia for the last several months. No true melena.  No unintentional weight loss.  Ibuprofen [phone] mg daily for at least a year.  Reports heavy periods at times.  Denies lightheadedness, dizziness, presyncope, or syncope.  No family history of colon cancer.  No prior colonoscopy or upper endoscopy.   Does not appear patient's chronic anemia has never been evaluated.  This may be secondary to menorrhagia; however, as she has significant upper GI symptoms, hematochezia, and chronic NSAID use, can't rule out esophagitis, gastritis, duodenitis, PUD, H. Pylori, celiac disease, AVMs, colon polyps, IBD, or malignancy.  I am requesting records from [facility]. If hemoglobin is still low, will complete iron studies.  Patient is already getting scheduled for colonoscopy due to hematochezia and follow-up diverticulitis as well as upper endoscopy/dilation for ongoing upper GI symptoms and dysphagia as discussed above.  These procedures will help evaluate anemia as well if present.  Addendum: CBC 08/30/19 with low hemoglobin at 11.1, up from 10.1 in 2016. Iron panel 08/26/19 within normal limits but on the low end of normal with ferritin 29, %saturation 16%, and iron 58.

## 2019-08-27 NOTE — Assessment & Plan Note (Addendum)
43 year old female with new onset of toilet tissue hematochezia x1 month.  Occurs with most BMs.  Denies rectal pain, burning, or history of hemorrhoids.  No bright red blood on the stool or in toilet water.  No true melena.  Admits to chronic postprandial diarrhea as discussed below.  She does have upper GI symptoms including decreased appetite, nausea, occasional vomiting, early satiety, and GERD symptoms not adequately controlled.  No unintentional weight loss.  Reports taking ibuprofen [phone] mg daily for over a year.  Has been on omeprazole 20 mg for years.  Recently increased to 40 mg about 1 week ago.  Recently treated for diverticulitis about 1.5 months ago and patient reports her abdominal pain has resolved.  Patient reports hemoglobin low on labs at [facility] and states her hemoglobin has always been low.  No prior evaluation.  Asymptomatic from anemia standpoint. No prior colonoscopy.  No family history of colon cancer.    Differentials for rectal bleeding include hemorrhoids, anorectal irritation secondary to diarrhea, colon polyps, diverticular bleeding, and malignancy.  NSAIDs may also be playing a role here.  Do not suspect upper GI symptoms are related to her hematochezia. She will need first ever colonoscopy for follow-up on diverticulitis as well as tissue hematochezia. Requesting records from [facility].  If patient is anemic, colonoscopy will also help with evaluation of this.  We are also scheduling EGD/ED for upper GI symptoms as discussed above.  Proceed with TCS + EGD/ED with Dr. Gala Romney in the near future. The risks, benefits, and alternatives have been discussed in detail with patient. They have stated understanding and desire to proceed.  Patient was advised to let us know if her abdominal pain returns prior to her procedures due to increased risk of colonic perforation. Counseled to limit/stop ibuprofen use and avoid all other NSAIDs.  She should try Tylenol first for her  headaches.  Do not take more than 3 g of Tylenol daily. Request records from [facility] Follow-up after procedures.  CBC 08/30/19 with low hemoglobin at 11.1, up from 10.1 in 2016. Iron panel 08/26/19 within normal limits but on the low end of normal with ferritin 29, %saturation 16%, and iron 58.

## 2019-08-27 NOTE — Progress Notes (Signed)
CC'ED TO PCP 

## 2019-08-27 NOTE — Telephone Encounter (Signed)
Received and reviewed records from [facility] dated 07/19/2019.  CT abdomen and pelvis with contrast with focal acute diverticulitis in the sigmoid portion of the colon in the right lower quadrant. The only labs received was patient's kidney function.  Kidney function was normal with creatinine 0.74.  Patient thought she had additional blood work completed.  As we do do not have a recent CBC or BMP on file, I would like to go ahead and obtain these.  Additionally, she reports history of anemia with last hemoglobin in our system 10.1 in 2016.  Would also like to obtain iron panel with ferritin.  Elmo Putt, please let patient know I have received and reviewed her records.  I would like for her to complete CBC, BMP, and iron panel with ferritin unless these can be added onto her labs drawn yesterday.  Labs from yesterday with normal thyroid function and no evidence of celiac disease.  We are waiting on stool studies.

## 2019-08-27 NOTE — Assessment & Plan Note (Addendum)
43 year old female with history of GERD and postprandial diarrhea with 5-27-month history of decreased appetite, nausea, intermittent vomiting without hematochezia, early satiety, reflux symptoms not adequately controlled on omeprazole, and intermittent dysphagia.  Historically has been on omeprazole 20 mg for years.  Recently increased omeprazole to 40 mg about 5 days ago.  Denies unintentional weight loss or upper abdominal pain. Admits to daily ibuprofen [phone] mg for over a year.  Occasional dark stools but no true melena.  Also with toilet tissue hematochezia with most BMs x1 month as discussed above. Doubt this is related to upper GI symptoms. Reports history of anemia.  No prior EGD or TCS.  Last hemoglobin in our system 10.1 in 2016.  Patient reports labs completed at [facility] at the time of diverticulitis diagnosis with low hemoglobin.  Suspect patient's constellation of upper GI symptoms is likely secondary to her significant daily NSAID use.  Differentials include gastritis, duodenitis, esophagitis, peptic ulcer disease, H. Pylori.  May have esophageal web, ring, or stricture developing secondary to uncontrolled GERD.  Cannot rule out malignancy or other potential causes of delayed gastric emptying such as gastroparesis or gastric outlet obstruction such as pyloric stenosis.  Proceed with EGD/ED with Dr. Gala Romney in the near future.  She will also have TCS at the same time for hematochezia and history of diverticulitis as discussed below. TCS will help evaluate diarrhea as well if workup unrevealing. Procedures will also evaluate for anemia if still present.  The risks, benefits, and alternatives have been discussed in detail with patient. They have stated understanding and desire to proceed.  Stop omeprazole and start Protonix 40 mg twice daily minutes before breakfast and dinner. Counseled on GERD diet and lifestyle.  Handout provided. Counseled to limit/stop ibuprofen use and avoid all  other NSAIDs.  She should try Tylenol first for her headaches.  Do not take more than 3 g of Tylenol daily. Request records from [facility] Follow-up after procedures.

## 2019-08-27 NOTE — Progress Notes (Signed)
Thyroid function normal.  No evidence of celiac disease.  Waiting on stool studies.

## 2019-08-27 NOTE — Assessment & Plan Note (Signed)
Addressed under nausea with vomiting 

## 2019-08-27 NOTE — Telephone Encounter (Signed)
I requested them this morning.

## 2019-08-28 NOTE — Progress Notes (Signed)
C. Diff negative. Waiting on GI pathogen panel.

## 2019-08-29 ENCOUNTER — Other Ambulatory Visit: Payer: Self-pay

## 2019-08-29 DIAGNOSIS — D649 Anemia, unspecified: Secondary | ICD-10-CM

## 2019-08-29 LAB — GASTROINTESTINAL PATHOGEN PANEL PCR
C. difficile Tox A/B, PCR: NOT DETECTED
Campylobacter, PCR: NOT DETECTED
Cryptosporidium, PCR: NOT DETECTED
E coli (ETEC) LT/ST PCR: NOT DETECTED
E coli (STEC) stx1/stx2, PCR: NOT DETECTED
E coli 0157, PCR: NOT DETECTED
Giardia lamblia, PCR: NOT DETECTED
Norovirus, PCR: NOT DETECTED
Rotavirus A, PCR: NOT DETECTED
Salmonella, PCR: NOT DETECTED
Shigella, PCR: NOT DETECTED

## 2019-08-29 LAB — C. DIFFICILE GDH AND TOXIN A/B
GDH ANTIGEN: NOT DETECTED
MICRO NUMBER:: 1177167
SPECIMEN QUALITY:: ADEQUATE
TOXIN A AND B: NOT DETECTED

## 2019-08-29 NOTE — Telephone Encounter (Signed)
Spoke with [facility], they are able to add the iron/ferritin panel and BMP. A lavender tube wasn't drawn and they won't be able to add the CBC. Spoke with pt, she is going to have the CBC drawn in the morning. Lab orders were placed and released for CBC.

## 2019-08-29 NOTE — Telephone Encounter (Signed)
Pt returned call and is aware that records were reviewed. We're  contacting [facility] to see if labs can be added. Pt is aware of the results that have resulted. Awaiting stool studies.

## 2019-08-29 NOTE — Telephone Encounter (Signed)
Noted  

## 2019-08-29 NOTE — Telephone Encounter (Signed)
Lmom, waiting on a return call.  

## 2019-08-30 ENCOUNTER — Other Ambulatory Visit: Payer: Self-pay | Admitting: Gastroenterology

## 2019-08-30 DIAGNOSIS — R197 Diarrhea, unspecified: Secondary | ICD-10-CM

## 2019-08-30 DIAGNOSIS — D649 Anemia, unspecified: Secondary | ICD-10-CM | POA: Diagnosis not present

## 2019-08-30 LAB — CBC WITH DIFFERENTIAL/PLATELET
Absolute Monocytes: 508 cells/uL (ref 200–950)
Basophils Absolute: 39 cells/uL (ref 0–200)
Basophils Relative: 0.5 %
Eosinophils Absolute: 108 cells/uL (ref 15–500)
Eosinophils Relative: 1.4 %
HCT: 33 % — ABNORMAL LOW (ref 35.0–45.0)
Hemoglobin: 11.1 g/dL — ABNORMAL LOW (ref 11.7–15.5)
Lymphs Abs: 2734 cells/uL (ref 850–3900)
MCH: 31.4 pg (ref 27.0–33.0)
MCHC: 33.6 g/dL (ref 32.0–36.0)
MCV: 93.2 fL (ref 80.0–100.0)
MPV: 9.9 fL (ref 7.5–12.5)
Monocytes Relative: 6.6 %
Neutro Abs: 4312 cells/uL (ref 1500–7800)
Neutrophils Relative %: 56 %
Platelets: 343 10*3/uL (ref 140–400)
RBC: 3.54 10*6/uL — ABNORMAL LOW (ref 3.80–5.10)
RDW: 12 % (ref 11.0–15.0)
Total Lymphocyte: 35.5 %
WBC: 7.7 10*3/uL (ref 3.8–10.8)

## 2019-08-30 MED ORDER — DICYCLOMINE HCL 10 MG PO CAPS: ORAL_CAPSULE | ORAL | 0 refills | Status: DC

## 2019-08-31 LAB — BASIC METABOLIC PANEL WITH GFR
BUN: 8 mg/dL (ref 7–25)
CO2: 20 mmol/L (ref 20–32)
Calcium: 9.5 mg/dL (ref 8.6–10.2)
Chloride: 104 mmol/L (ref 98–110)
Creat: 0.96 mg/dL (ref 0.50–1.10)
GFR, Est African American: 85 mL/min/{1.73_m2} (ref >=60)
GFR, Est Non African American: 73 mL/min/{1.73_m2} (ref >=60)
Glucose, Bld: 88 mg/dL (ref 65–139)
Potassium: 4.2 mmol/L (ref 3.5–5.3)
Sodium: 143 mmol/L (ref 135–146)

## 2019-08-31 LAB — IRON,TIBC AND FERRITIN PANEL
%SAT: 16 % (calc) (ref 16–45)
Ferritin: 29 ng/mL (ref 16–232)
Iron: 58 ug/dL (ref 40–190)
TIBC: 373 mcg/dL (calc) (ref 250–450)

## 2019-08-31 LAB — TEST AUTHORIZATION

## 2019-08-31 LAB — TISSUE TRANSGLUTAMINASE, IGA: (tTG) Ab, IgA: 1 U/mL

## 2019-08-31 LAB — TSH: TSH: 0.58 mIU/L

## 2019-08-31 LAB — IGA: Immunoglobulin A: 226 mg/dL (ref 47–310)

## 2019-09-03 ENCOUNTER — Ambulatory Visit: Payer: BC Managed Care – PPO

## 2019-09-03 ENCOUNTER — Other Ambulatory Visit: Payer: Self-pay

## 2019-09-03 DIAGNOSIS — G4489 Other headache syndrome: Secondary | ICD-10-CM

## 2019-09-03 MED ORDER — GADOBENATE DIMEGLUMINE 529 MG/ML IV SOLN
15.0000 mL | Freq: Once | INTRAVENOUS | Status: AC | PRN
  Administered 2019-09-03: 10:00:00 15 mL via INTRAVENOUS

## 2019-09-05 ENCOUNTER — Telehealth: Payer: Self-pay

## 2019-09-05 NOTE — Telephone Encounter (Signed)
I called pt and discussed this with her. Pt is agreeable to this plan. Pt scheduled a follow up on 11/04/19 at 10:30 am, check in at 10:00am. Pt verbalized understanding of new appt date and time.

## 2019-09-05 NOTE — Telephone Encounter (Signed)
[employer] insurance denied HST and PSG. Stated it was not medically necessary.Only option would be a peer to peer. Please advise.

## 2019-09-05 NOTE — Telephone Encounter (Signed)
Called pt to reschedule TCS/EGD/-/+DIL w/RMR d/t RMR will not be available 11/20/19. Procedure rescheduled to 11/06/19 at 1:00pm. COVID test rescheduled to 11/05/19 at 11:00am. Endo scheduler informed. Appt letter mailed with new procedure instructions.

## 2019-09-05 NOTE — Telephone Encounter (Signed)
Please advise patient that her insurance denied a laboratory attended sleep study as well as a home sleep test as medically not indicated.  For now, we will proceed with migraine management as discussed during her appointment and follow-up as scheduled and reassess need for sleep testing in the future.

## 2019-09-09 ENCOUNTER — Telehealth: Payer: Self-pay

## 2019-09-09 NOTE — Progress Notes (Signed)
Please advise patient that her brain MRI with and without contrast from 09/05/19 was reported as normal.

## 2019-09-09 NOTE — Telephone Encounter (Signed)
Star Age, MD  09/09/2019 12:05 PM EST    Please advise patient that her brain MRI with and without contrast from 09/05/19 was reported as normal.    Pt notified of results and verbalized understanding. She had no questions at this time.

## 2019-09-10 DIAGNOSIS — Z299 Encounter for prophylactic measures, unspecified: Secondary | ICD-10-CM | POA: Diagnosis not present

## 2019-09-10 DIAGNOSIS — Z713 Dietary counseling and surveillance: Secondary | ICD-10-CM | POA: Diagnosis not present

## 2019-09-10 DIAGNOSIS — M5137 Other intervertebral disc degeneration, lumbosacral region: Secondary | ICD-10-CM | POA: Diagnosis not present

## 2019-09-10 DIAGNOSIS — Z6831 Body mass index (BMI) 31.0-31.9, adult: Secondary | ICD-10-CM | POA: Diagnosis not present

## 2019-09-17 DIAGNOSIS — R05 Cough: Secondary | ICD-10-CM | POA: Diagnosis not present

## 2019-09-17 DIAGNOSIS — Z6831 Body mass index (BMI) 31.0-31.9, adult: Secondary | ICD-10-CM | POA: Diagnosis not present

## 2019-09-17 DIAGNOSIS — R519 Headache, unspecified: Secondary | ICD-10-CM | POA: Diagnosis not present

## 2019-09-17 DIAGNOSIS — U071 COVID-19: Secondary | ICD-10-CM | POA: Diagnosis not present

## 2019-09-17 DIAGNOSIS — Z299 Encounter for prophylactic measures, unspecified: Secondary | ICD-10-CM | POA: Diagnosis not present

## 2019-10-03 DIAGNOSIS — K219 Gastro-esophageal reflux disease without esophagitis: Secondary | ICD-10-CM | POA: Diagnosis not present

## 2019-10-03 DIAGNOSIS — B009 Herpesviral infection, unspecified: Secondary | ICD-10-CM | POA: Diagnosis not present

## 2019-10-03 DIAGNOSIS — Z299 Encounter for prophylactic measures, unspecified: Secondary | ICD-10-CM | POA: Diagnosis not present

## 2019-10-03 DIAGNOSIS — Z789 Other specified health status: Secondary | ICD-10-CM | POA: Diagnosis not present

## 2019-10-03 DIAGNOSIS — U071 COVID-19: Secondary | ICD-10-CM | POA: Diagnosis not present

## 2019-10-03 DIAGNOSIS — Z6831 Body mass index (BMI) 31.0-31.9, adult: Secondary | ICD-10-CM | POA: Diagnosis not present

## 2019-10-21 DIAGNOSIS — M79674 Pain in right toe(s): Secondary | ICD-10-CM | POA: Diagnosis not present

## 2019-10-21 DIAGNOSIS — S93331D Other subluxation of right foot, subsequent encounter: Secondary | ICD-10-CM | POA: Diagnosis not present

## 2019-10-21 DIAGNOSIS — M79671 Pain in right foot: Secondary | ICD-10-CM | POA: Diagnosis not present

## 2019-11-04 ENCOUNTER — Ambulatory Visit (INDEPENDENT_AMBULATORY_CARE_PROVIDER_SITE_OTHER): Payer: BC Managed Care – PPO | Admitting: Neurology

## 2019-11-04 ENCOUNTER — Encounter: Payer: Self-pay | Admitting: Neurology

## 2019-11-04 ENCOUNTER — Other Ambulatory Visit: Payer: Self-pay

## 2019-11-04 VITALS — BP 128/86 | HR 73 | Temp 97.9°F | Ht 62.0 in | Wt 175.0 lb

## 2019-11-04 DIAGNOSIS — G43019 Migraine without aura, intractable, without status migrainosus: Secondary | ICD-10-CM | POA: Diagnosis not present

## 2019-11-04 MED ORDER — AJOVY 225 MG/1.5ML ~~LOC~~ SOAJ: 225.0000 mg | SUBCUTANEOUS | 5 refills | Status: DC

## 2019-11-04 MED ORDER — ONDANSETRON HCL 8 MG PO TABS: 8.0000 mg | ORAL_TABLET | Freq: Three times a day (TID) | ORAL | 2 refills | Status: DC | PRN

## 2019-11-04 NOTE — Progress Notes (Signed)
Subjective:    Patient ID: Tonya Love is a 44 y.o. female.  HPI     Interim history:  Tonya Love is a 43 year old right-handed woman with an underlying medical history of reflux disease, anxiety, depression, history of pancreatitis and obesity, who presents for follow-up consultation of her migraine headaches.  The patient is unaccompanied today.  I first met her on 08/05/2019 at the request of her primary care physician, at which time she reported a longstanding history of migraine headaches since her 24s.  She had tried Imitrex in the past.  I suggested a trial of Maxalt.  We talked about potentially utilizing preventative medications she was advised to proceed with a sleep study to rule out underlying obstructive sleep apnea and a brain MRI.  She had a brain MRI with and without contrast on 09/03/2019 and I reviewed the results: IMPRESSION:    Normal MRI brain (with and without).    We called her with her test results.  Unfortunately, her insurance denied a laboratory sleep study as well as a home sleep test.  Today, 11/04/2019: She reports that the Maxalt helps, some days better than others, she needs a prescription for Zofran as she is out.  Her migraine frequency has increased a little bit, she can have 2 or 3 migraines per week, they can last 24 to 36 hours.  She has not been on preventative medication in the past but has tried as needed medication in the past.  She has had some intermittent palpitations and chest discomfort, also jitteriness.  This is not after taking Maxalt actually.  It can be when she is just sitting and she feels unwell, she has noticed on her health tracker watch that her pulse rate can go up to the 150s, yesterday she had a similar episode in her pulse rate was 158.  She has not seen her primary care physician or nurse practitioner yet for this.  She has not seen a cardiologist in the past.  She has not seen a correlation between her caffeine intake and her  symptoms and also she had not skipped any meals.  She tries to hydrate well with water, drinks caffeine in the form of [facility] occasionally, not every day, does not like coffee.  The patient's allergies, current medications, family history, past medical history, past social history, past surgical history and problem list were reviewed and updated as appropriate.   Previously:   08/05/2019: (She) reports recurrent migrainous headaches for years, probably since her 67s.  In the past, she had seen a neurologist, she also reports having had an MRI done over 2 decades ago which at that time was normal.  She has tried Imitrex in the past, she also tried 2 other as needed medications as she recalls but she does not actually remember the names.  It does not sound like that she was on daily preventative medication in the past.  Her migraines are typically left frontal and frontotemporal, throbbing, associated with nausea, rare vomiting, associated with photophobia, some sonophobia as well.  She remembers that Imitrex was helpful.  She also has some morning headaches which at times are less intense, more dull and achy.  She reports snoring and daytime somnolence.  She has nocturia about once or twice per average night.  She has not had an eye examination in over 2 years and sometimes has blurry vision.  She has no associated neurological accompaniments with her migraine headaches including no one-sided weakness, numbness, tingling,  droopy face or slurring of speech.  She does not always get enough sleep she admits.  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  She is a non-smoker and drinks alcohol rarely, she drinks caffeine in the form of [facility], 1-1/2 bottles per day, typically 20 ounce bottle.  She admits that she does not typically drink enough water.  She does not like to drink water.  For nausea, she has tried Zofran in the past and has a prescription for  Phenergan through GI. I reviewed your office note from 06/18/2019. She lives alone, she is separated.  She has 2 children, daughter age 63, son age 75, her daughter has a 62-year-old daughter.  The patient has woken up with a sense of gasping for air at times. Her Epworth sleepiness score is 12 out of 24 today, fatigue severity score is 53 out of 63.  Her migraine headache frequency is about once a week currently.     Her Past Medical History Is Significant For: Past Medical History:  Diagnosis Date  . Anxiety   . Depression   . Migraines   . Pancreatitis     Her Past Surgical History Is Significant For: Past Surgical History:  Procedure Laterality Date  . CHOLECYSTECTOMY      Her Family History Is Significant For: Family History  Problem Relation Age of Onset  . Depression Maternal Aunt   . Colon cancer Neg Hx     Her Social History Is Significant For: Social History   Socioeconomic History  . Marital status: Legally Separated    Spouse name: Not on file  . Number of children: Not on file  . Years of education: Not on file  . Highest education level: Not on file  Occupational History  . Not on file  Tobacco Use  . Smoking status: Never Smoker  . Smokeless tobacco: Never Used  Substance and Sexual Activity  . Alcohol use: No  . Drug use: No  . Sexual activity: Yes    Birth control/protection: Pill  Other Topics Concern  . Not on file  Social History Narrative  . Not on file   Social Determinants of Health   Financial Resource Strain:   . Difficulty of Paying Living Expenses: Not on file  Food Insecurity:   . Worried About Charity fundraiser in the Last Year: Not on file  . Ran Out of Food in the Last Year: Not on file  Transportation Needs:   . Lack of Transportation (Medical): Not on file  . Lack of Transportation (Non-Medical): Not on file  Physical Activity:   . Days of Exercise per Week: Not on file  . Minutes of Exercise per Session: Not on file   Stress:   . Feeling of Stress : Not on file  Social Connections:   . Frequency of Communication with Friends and Family: Not on file  . Frequency of Social Gatherings with Friends and Family: Not on file  . Attends Religious Services: Not on file  . Active Member of Clubs or Organizations: Not on file  . Attends Archivist Meetings: Not on file  . Marital Status: Not on file    Her Allergies Are:  Allergies  Allergen Reactions  . Codeine Nausea Only  :   Her Current Medications Are:  Outpatient Encounter Medications as of 11/04/2019  Medication Sig  . dicyclomine (BENTYL) 10 MG capsule Take 1 capsule  by mouth 30 minutes before breakfast. May take up to 3 times daily before meals and 1 capsule at bedtime as needed. Hold for constipation.  Marland Kitchen ibuprofen (ADVIL,MOTRIN) 200 MG tablet Take 600 mg by mouth every 6 (six) hours as needed for headache.  Marland Kitchen omeprazole (PRILOSEC) 40 MG capsule Take 40 mg by mouth daily.  . pantoprazole (PROTONIX) 40 MG tablet Take 1 tablet (40 mg total) by mouth 2 (two) times daily.  . polyethylene glycol-electrolytes (TRILYTE) 420 g solution Take 4,000 mLs by mouth as directed.  . rizatriptan (MAXALT-MLT) 10 MG disintegrating tablet Take 1 tablet (10 mg total) by mouth as needed for migraine. May repeat in 2 hours if needed, no more than 2 pills in 24 hours.  . ondansetron (ZOFRAN) 8 MG tablet Take 1 tablet (8 mg total) by mouth every 4 (four) hours as needed. (Patient not taking: Reported on 11/04/2019)   No facility-administered encounter medications on file as of 11/04/2019.  :  Review of Systems:  Out of a complete 14 point review of systems, all are reviewed and negative with the exception of these symptoms as listed below:  Review of Systems  Neurological:       Reports migraines are still present. Rizatriptan has helped some. Needs a refill on zofran.     Objective:  Neurological Exam  Physical Exam Physical Examination:   Vitals:    11/04/19 1044  BP: 128/86  Pulse: 73  Temp: 97.9 F (36.6 C)    General Examination: The patient is a very pleasant 44 y.o. female in no acute distress. She appears well-developed and well-nourished and well groomed.   HEENT: Normocephalic, atraumatic, pupils are equal, round and reactive to light, extraocular tracking is good without limitation to gaze excursion or nystagmus noted. Normal smooth pursuit is noted. Hearing is grossly intact. Speech is clear with no dysarthria noted. There is no hypophonia. There is no lip, neck/head, jaw or voice tremor. Neck is supple with full range of passive and active motion. There are no carotid bruits on auscultation. Oropharynx exam reveals: mild mouth dryness, good dental hygiene and mild airway crowding, tongue protrudes centrally in palate elevates symmetrically.   Chest: Clear to auscultation without wheezing, rhonchi or crackles noted.  Heart: S1+S2+0, regular and normal without murmurs, rubs or gallops noted.   Abdomen: Soft, non-tender and non-distended with normal bowel sounds appreciated on auscultation.  Extremities: There is no pitting edema in the distal lower extremities bilaterally.   Skin: Warm and dry without trophic changes noted.  Musculoskeletal: exam reveals no obvious joint deformities, tenderness or joint swelling or erythema.   Neurologically:  Mental status: The patient is awake, alert and oriented in all 4 spheres. Her immediate and remote memory, attention, language skills and fund of knowledge are appropriate. There is no evidence of aphasia, agnosia, apraxia or anomia. Speech is clear with normal prosody and enunciation. Thought process is linear. Mood is normal and affect is normal.  Cranial nerves II - XII are as described above under HEENT exam. In addition: shoulder shrug is normal with equal shoulder height noted. Motor exam: Normal bulk, strength and tone is noted. There is no drift, tremor or rebound. Romberg  is negative. Fine motor skills and coordination: grossly intact with normal finger taps, normal hand movements, normal rapid alternating patting, normal foot taps and normal foot agility.  Cerebellar testing: No dysmetria or intention tremor on finger to nose testing. Heel to shin is unremarkable bilaterally. There is no truncal or  gait ataxia.  Sensory exam: intact to light touch in the upper and lower extremities.  Gait, station and balance: She stands easily. No veering to one side is noted. No leaning to one side is noted. Posture is age-appropriate and stance is narrow based. Gait shows normal stride length and normal pace. No problems turning are noted.   Assessment and Plan:  In summary, Tonya Love is a very pleasant 44 year old female  with an underlying medical history of reflux disease, anxiety, depression, history of pancreatitis and mild obesity, who Presents for Consultation of her migraine headaches, her migraine frequency has increased, she has typically no visual aura but has associated nausea.  She has had some success with as needed use of Maxalt and also has used Zofran as needed for nausea.  She has an up-to-date prescription for Maxalt and needs a refill on Zofran which I provided today.  We talked about preventative medications including traditional oral medications and newer injectables.  I think she would be a great candidate for one of the injectable medications, such as Aimovig, Emgality and Ajovy.  I suggested we start her on monthly injections with Ajovy. We talked about expectations and common side effects, she was given written instructions and a new prescription as well as a co-pay card.  She had seen A neurologist in the past, also had an MRI in the past, her recent brain MRI was benign, we talked about doing a sleep study to rule out obstructive sleep apnea, her insurance did not cover her for a home sleep test or in lab study, we can revisit down the road. I suggested  a 59-monthfollow-up routinely to see the nurse practitioner.  Her exam is nonfocal thankfully.  We talked about headache triggers as well.  She is encouraged to stay well-hydrated and well rested.  I answered all her questions today and she was in agreement with the plan. I spent 30 minutes in total face-to-face time and in reviewing records during pre-charting, more than 50% of which was spent in counseling and coordination of care, reviewing test results, reviewing medications and treatment regimen and/or in discussing or reviewing the diagnosis of migraine HAs, the prognosis and treatment options. Pertinent laboratory and imaging test results that were available during this visit with the patient were reviewed by me and considered in my medical decision making (see chart for details).

## 2019-11-04 NOTE — Patient Instructions (Signed)
As discussed, you can continue with the Maxalt as needed for your migraine attacks, I renewed your prescription for your Zofran for as needed use for nausea and/or vomiting. Your neurological exam is normal, thankfully. Your brain MRI was also benign. We may revisit the need for your sleep study testing.  Please talk to your primary care physician about seeing a cardiologist because of your chest discomfort and palpitations noted as well as actual rapid heartbeat noted on your health tracker watch. As discussed, you are a good candidate for one of the newer injectable medications, such as Aimovig, Ajovy and Emgality For migraine prevention.  As discussed, we will start Ajovy 225/1.65ml, 1 inj subcutaneously every 30 days.   We can help you with your first injection and education, if needed.  Potential side effects may include: Antibody development, local injection site reaction, muscle cramping, swelling, nausea and sleepiness. Please follow-up in 6 months to see the nurse practitioner, sooner if needed, call anytime or email through MyChart if you have any additional questions or concerns.

## 2019-11-05 ENCOUNTER — Telehealth: Payer: Self-pay

## 2019-11-05 ENCOUNTER — Other Ambulatory Visit (HOSPITAL_COMMUNITY)
Admission: RE | Admit: 2019-11-05 | Discharge: 2019-11-05 | Disposition: A | Discharge status: Home / Self Care | Payer: BC Managed Care – PPO | Source: Ambulatory Visit | Attending: Internal Medicine | Admitting: Internal Medicine

## 2019-11-05 ENCOUNTER — Other Ambulatory Visit: Payer: Self-pay

## 2019-11-05 DIAGNOSIS — Z20822 Contact with and (suspected) exposure to covid-19: Secondary | ICD-10-CM | POA: Insufficient documentation

## 2019-11-05 DIAGNOSIS — Z01812 Encounter for preprocedural laboratory examination: Secondary | ICD-10-CM | POA: Insufficient documentation

## 2019-11-05 LAB — SARS CORONAVIRUS 2 (TAT 6-24 HRS): SARS Coronavirus 2: NEGATIVE

## 2019-11-06 ENCOUNTER — Encounter (HOSPITAL_COMMUNITY)
Admission: RE | Disposition: A | Discharge status: Home / Self Care | Payer: Self-pay | Source: Home / Self Care | Attending: Internal Medicine

## 2019-11-06 ENCOUNTER — Ambulatory Visit (HOSPITAL_COMMUNITY)
Admission: RE | Admit: 2019-11-06 | Discharge: 2019-11-06 | Disposition: A | Discharge status: Home / Self Care | Payer: BC Managed Care – PPO | Attending: Internal Medicine | Admitting: Internal Medicine

## 2019-11-06 ENCOUNTER — Other Ambulatory Visit: Payer: Self-pay

## 2019-11-06 ENCOUNTER — Encounter (HOSPITAL_COMMUNITY): Payer: Self-pay | Admitting: Internal Medicine

## 2019-11-06 DIAGNOSIS — K641 Second degree hemorrhoids: Secondary | ICD-10-CM | POA: Insufficient documentation

## 2019-11-06 DIAGNOSIS — R112 Nausea with vomiting, unspecified: Secondary | ICD-10-CM | POA: Diagnosis not present

## 2019-11-06 DIAGNOSIS — F329 Major depressive disorder, single episode, unspecified: Secondary | ICD-10-CM | POA: Diagnosis not present

## 2019-11-06 DIAGNOSIS — G43909 Migraine, unspecified, not intractable, without status migrainosus: Secondary | ICD-10-CM | POA: Diagnosis not present

## 2019-11-06 DIAGNOSIS — K529 Noninfective gastroenteritis and colitis, unspecified: Secondary | ICD-10-CM

## 2019-11-06 DIAGNOSIS — K921 Melena: Secondary | ICD-10-CM | POA: Diagnosis not present

## 2019-11-06 DIAGNOSIS — R1314 Dysphagia, pharyngoesophageal phase: Secondary | ICD-10-CM | POA: Insufficient documentation

## 2019-11-06 DIAGNOSIS — K449 Diaphragmatic hernia without obstruction or gangrene: Secondary | ICD-10-CM | POA: Diagnosis not present

## 2019-11-06 DIAGNOSIS — Z9049 Acquired absence of other specified parts of digestive tract: Secondary | ICD-10-CM | POA: Diagnosis not present

## 2019-11-06 DIAGNOSIS — R1310 Dysphagia, unspecified: Secondary | ICD-10-CM | POA: Diagnosis not present

## 2019-11-06 DIAGNOSIS — Z8719 Personal history of other diseases of the digestive system: Secondary | ICD-10-CM

## 2019-11-06 DIAGNOSIS — Z79899 Other long term (current) drug therapy: Secondary | ICD-10-CM | POA: Insufficient documentation

## 2019-11-06 DIAGNOSIS — Z791 Long term (current) use of non-steroidal anti-inflammatories (NSAID): Secondary | ICD-10-CM | POA: Insufficient documentation

## 2019-11-06 DIAGNOSIS — K222 Esophageal obstruction: Secondary | ICD-10-CM

## 2019-11-06 DIAGNOSIS — Z818 Family history of other mental and behavioral disorders: Secondary | ICD-10-CM | POA: Diagnosis not present

## 2019-11-06 DIAGNOSIS — F419 Anxiety disorder, unspecified: Secondary | ICD-10-CM | POA: Diagnosis not present

## 2019-11-06 DIAGNOSIS — K625 Hemorrhage of anus and rectum: Secondary | ICD-10-CM

## 2019-11-06 DIAGNOSIS — Z885 Allergy status to narcotic agent status: Secondary | ICD-10-CM | POA: Insufficient documentation

## 2019-11-06 DIAGNOSIS — K219 Gastro-esophageal reflux disease without esophagitis: Secondary | ICD-10-CM | POA: Insufficient documentation

## 2019-11-06 DIAGNOSIS — K5730 Diverticulosis of large intestine without perforation or abscess without bleeding: Secondary | ICD-10-CM | POA: Insufficient documentation

## 2019-11-06 HISTORY — PX: COLONOSCOPY: SHX5424

## 2019-11-06 HISTORY — PX: MALONEY DILATION: SHX5535

## 2019-11-06 HISTORY — PX: BIOPSY: SHX5522

## 2019-11-06 HISTORY — PX: ESOPHAGOGASTRODUODENOSCOPY: SHX5428

## 2019-11-06 SURGERY — COLONOSCOPY
Anesthesia: Moderate Sedation

## 2019-11-06 MED ORDER — LIDOCAINE VISCOUS HCL 2 % MT SOLN
OROMUCOSAL | Status: DC | PRN
  Administered 2019-11-06: 1 via OROMUCOSAL

## 2019-11-06 MED ORDER — MEPERIDINE HCL 100 MG/ML IJ SOLN
INTRAMUSCULAR | Status: DC | PRN
  Administered 2019-11-06: 15 mg
  Administered 2019-11-06: 10 mg
  Administered 2019-11-06: 25 mg

## 2019-11-06 MED ORDER — ONDANSETRON HCL 4 MG/2ML IJ SOLN
INTRAMUSCULAR | Status: DC | PRN
  Administered 2019-11-06: 4 mg via INTRAVENOUS

## 2019-11-06 MED ORDER — SODIUM CHLORIDE 0.9 % IV SOLN: INTRAVENOUS | Status: DC

## 2019-11-06 MED ORDER — ONDANSETRON HCL 4 MG/2ML IJ SOLN
INTRAMUSCULAR | Status: AC
  Filled 2019-11-06: qty 2

## 2019-11-06 MED ORDER — MIDAZOLAM HCL 5 MG/5ML IJ SOLN
INTRAMUSCULAR | Status: AC
  Filled 2019-11-06: qty 5

## 2019-11-06 MED ORDER — STERILE WATER FOR IRRIGATION IR SOLN: Status: DC | PRN

## 2019-11-06 MED ORDER — MEPERIDINE HCL 50 MG/ML IJ SOLN
INTRAMUSCULAR | Status: AC
  Filled 2019-11-06: qty 1

## 2019-11-06 MED ORDER — MIDAZOLAM HCL 5 MG/5ML IJ SOLN
INTRAMUSCULAR | Status: DC | PRN
  Administered 2019-11-06: 2 mg via INTRAVENOUS
  Administered 2019-11-06: 1 mg via INTRAVENOUS
  Administered 2019-11-06: 2 mg via INTRAVENOUS
  Administered 2019-11-06: 1 mg via INTRAVENOUS
  Administered 2019-11-06: 2 mg via INTRAVENOUS
  Administered 2019-11-06: 1 mg via INTRAVENOUS
  Administered 2019-11-06: 2 mg via INTRAVENOUS

## 2019-11-06 MED ORDER — LIDOCAINE VISCOUS HCL 2 % MT SOLN
OROMUCOSAL | Status: AC
  Filled 2019-11-06: qty 15

## 2019-11-06 MED ORDER — MIDAZOLAM HCL 5 MG/5ML IJ SOLN
INTRAMUSCULAR | Status: AC
  Filled 2019-11-06: qty 10

## 2019-11-06 NOTE — Op Note (Signed)
[facility] Patient Name: Tonya Love Procedure Date: 11/06/2019 12:49 PM MRN: [id number] Date of Birth: 04/01/1976 Attending MD: Gennette Pac , MD CSN: [id number] Age: 44 Admit Type: Outpatient Procedure:                Upper GI endoscopy Indications:              Dysphagia Providers:                Gennette Pac, MD, Jannett Celestine, RN, Pandora Leiter, Technician Referring MD:              Medicines:                Midazolam 7 mg IV, Meperidine 50 mg IV, Ondansetron                            4 mg IV Complications:            No immediate complications. Estimated Blood Loss:     Estimated blood loss was minimal. Procedure:                Pre-Anesthesia Assessment:                           - Prior to the procedure, a History and Physical                            was performed, and patient medications and                            allergies were reviewed. The patient's tolerance of                            previous anesthesia was also reviewed. The risks                            and benefits of the procedure and the sedation                            options and risks were discussed with the patient.                            All questions were answered, and informed consent                            was obtained. Prior Anticoagulants: The patient has                            taken no previous anticoagulant or antiplatelet                            agents. ASA Grade Assessment: II - A patient with  mild systemic disease. After reviewing the risks                            and benefits, the patient was deemed in                            satisfactory condition to undergo the procedure.                           After obtaining informed consent, the endoscope was                            passed under direct vision. Throughout the                            procedure, the patient's blood pressure,  pulse, and                            oxygen saturations were monitored continuously. The                            GIF-H190 ([id number]) scope was introduced through the                            mouth, and advanced to the second part of duodenum.                            The upper GI endoscopy was accomplished without                            difficulty. The patient tolerated the procedure                            well. Scope In: 1:55:55 PM Scope Out: 2:06:36 PM Total Procedure Duration: 0 hours 10 minutes 41 seconds  Findings:      Noncritical appearing Schatzki's ring. 2 "tongues" of salmon-colored       epithelium coming up a 1-1/2 cm above the ring (GE junction). No       esophagitis. no nodularity.      A mild Schatzki ring was found at the gastroesophageal junction.      A small hiatal hernia was present.      The exam was otherwise without abnormality.      The duodenal bulb and second portion of the duodenum were normal. The       scope was withdrawn. Dilation was performed with a Maloney dilator with       mild resistance at 54 Fr. The dilation site was examined following       endoscope reinsertion and showed no change. Estimated blood loss: none.       Finally, the abnormal distal esophagus was biopsied for histologic study Impression:               - Mild Schatzki ring. Dilated. Abnormal distal  esophageal mucosa status post biopsy                           - Small hiatal hernia.                           - The examination was otherwise normal.                           - Normal duodenal bulb and second portion of the                            duodenum. Moderate Sedation:      Moderate (conscious) sedation was administered by the endoscopy nurse       and supervised by the endoscopist. The following parameters were       monitored: oxygen saturation, heart rate, blood pressure, and response       to care. Total physician intraservice time  was 24 minutes. Recommendation:           - Patient has a contact number available for                            emergencies. The signs and symptoms of potential                            delayed complications were discussed with the                            patient. Return to normal activities tomorrow.                            Written discharge instructions were provided to the                            patient.                           - Advance diet as tolerated. Follow-up on                            pathology. Further recommendations to follow. Procedure Code(s):        --- Professional ---                           [phone], Esophagogastroduodenoscopy, flexible,                            transoral; diagnostic, including collection of                            specimen(s) by brushing or washing, when performed                            (separate procedure)  40981, Dilation of esophagus, by unguided sound or                            bougie, single or multiple passes                           G0500, Moderate sedation services provided by the                            same physician or other qualified health care                            professional performing a gastrointestinal                            endoscopic service that sedation supports,                            requiring the presence of an independent trained                            observer to assist in the monitoring of the                            patient's level of consciousness and physiological                            status; initial 15 minutes of intra-service time;                            patient age 35 years or older (additional time may                            be reported with 19147, as appropriate) Diagnosis Code(s):        --- Professional ---                           K22.2, Esophageal obstruction                           K44.9, Diaphragmatic hernia without  obstruction or                            gangrene                           R13.10, Dysphagia, unspecified CPT copyright 2019 [employer]. All rights reserved. The codes documented in this report are preliminary and upon coder review may  be revised to meet current compliance requirements. Tonya Love. Verlee Pope, MD Gennette Pac, MD 11/06/2019 2:12:16 PM This report has been signed electronically. Number of Addenda: 0

## 2019-11-06 NOTE — Discharge Instructions (Signed)
Colonoscopy Discharge Instructions  Read the instructions outlined below and refer to this sheet in the next few weeks. These discharge instructions provide you with general information on caring for yourself after you leave the hospital. Your doctor may also give you specific instructions. While your treatment has been planned according to the most current medical practices available, unavoidable complications occasionally occur. If you have any problems or questions after discharge, call Dr. Gala Romney at [phone]. ACTIVITY  You may resume your regular activity, but move at a slower pace for the next 24 hours.   Take frequent rest periods for the next 24 hours.   Walking will help get rid of the air and reduce the bloated feeling in your belly (abdomen).   No driving for 24 hours (because of the medicine (anesthesia) used during the test).    Do not sign any important legal documents or operate any machinery for 24 hours (because of the anesthesia used during the test).  NUTRITION  Drink plenty of fluids.   You may resume your normal diet as instructed by your doctor.   Begin with a light meal and progress to your normal diet. Heavy or fried foods are harder to digest and may make you feel sick to your stomach (nauseated).   Avoid alcoholic beverages for 24 hours or as instructed.  MEDICATIONS  You may resume your normal medications unless your doctor tells you otherwise.  WHAT YOU CAN EXPECT TODAY  Some feelings of bloating in the abdomen.   Passage of more gas than usual.   Spotting of blood in your stool or on the toilet paper.  IF YOU HAD POLYPS REMOVED DURING THE COLONOSCOPY:  No aspirin products for 7 days or as instructed.   No alcohol for 7 days or as instructed.   Eat a soft diet for the next 24 hours.  FINDING OUT THE RESULTS OF YOUR TEST Not all test results are available during your visit. If your test results are not back during the visit, make an appointment  with your caregiver to find out the results. Do not assume everything is normal if you have not heard from your caregiver or the medical facility. It is important for you to follow up on all of your test results.  SEEK IMMEDIATE MEDICAL ATTENTION IF:  You have more than a spotting of blood in your stool.   Your belly is swollen (abdominal distention).   You are nauseated or vomiting.   You have a temperature over 101.   You have abdominal pain or discomfort that is severe or gets worse throughout the day.  EGD Discharge instructions Please read the instructions outlined below and refer to this sheet in the next few weeks. These discharge instructions provide you with general information on caring for yourself after you leave the hospital. Your doctor may also give you specific instructions. While your treatment has been planned according to the most current medical practices available, unavoidable complications occasionally occur. If you have any problems or questions after discharge, please call your doctor. ACTIVITY  You may resume your regular activity but move at a slower pace for the next 24 hours.   Take frequent rest periods for the next 24 hours.   Walking will help expel (get rid of) the air and reduce the bloated feeling in your abdomen.   No driving for 24 hours (because of the anesthesia (medicine) used during the test).   You may shower.   Do not sign any important  legal documents or operate any machinery for 24 hours (because of the anesthesia used during the test).  NUTRITION  Drink plenty of fluids.   You may resume your normal diet.   Begin with a light meal and progress to your normal diet.   Avoid alcoholic beverages for 24 hours or as instructed by your caregiver.  MEDICATIONS  You may resume your normal medications unless your caregiver tells you otherwise.  WHAT YOU CAN EXPECT TODAY  You may experience abdominal discomfort such as a feeling of fullness  or "gas" pains.  FOLLOW-UP  Your doctor will discuss the results of your test with you.  SEEK IMMEDIATE MEDICAL ATTENTION IF ANY OF THE FOLLOWING OCCUR:  Excessive nausea (feeling sick to your stomach) and/or vomiting.   Severe abdominal pain and distention (swelling).   Trouble swallowing.   Temperature over 101 F (37.8 C).   Rectal bleeding or vomiting of blood.   Hemorrhoid, diverticulosis and GERD information provided  Stop Protonix/omeprazole.  Begin Dexilant 60 mg once daily in the morning for GERD  Further recommendations to follow pending review of pathology report  Office visit with [place] in 6 weeks  At patient request I called Melanie at [phone] and discussed results    Gastroesophageal Reflux Disease, Adult Gastroesophageal reflux (GER) happens when acid from the stomach flows up into the tube that connects the mouth and the stomach (esophagus). Normally, food travels down the esophagus and stays in the stomach to be digested. However, when a person has GER, food and stomach acid sometimes move back up into the esophagus. If this becomes a more serious problem, the person may be diagnosed with a disease called gastroesophageal reflux disease (GERD). GERD occurs when the reflux:  Happens often.  Causes frequent or severe symptoms.  Causes problems such as damage to the esophagus. When stomach acid comes in contact with the esophagus, the acid may cause soreness (inflammation) in the esophagus. Over time, GERD may create small holes (ulcers) in the lining of the esophagus. What are the causes? This condition is caused by a problem with the muscle between the esophagus and the stomach (lower esophageal sphincter, or LES). Normally, the LES muscle closes after food passes through the esophagus to the stomach. When the LES is weakened or abnormal, it does not close properly, and that allows food and stomach acid to go back up into the esophagus. The LES can be weakened  by certain dietary substances, medicines, and medical conditions, including:  Tobacco use.  Pregnancy.  Having a hiatal hernia.  Alcohol use.  Certain foods and beverages, such as coffee, chocolate, onions, and peppermint. What increases the risk? You are more likely to develop this condition if you:  Have an increased body weight.  Have a connective tissue disorder.  Use NSAID medicines. What are the signs or symptoms? Symptoms of this condition include:  Heartburn.  Difficult or painful swallowing.  The feeling of having a lump in the throat.  Abitter taste in the mouth.  Bad breath.  Having a large amount of saliva.  Having an upset or bloated stomach.  Belching.  Chest pain. Different conditions can cause chest pain. Make sure you see your health care provider if you experience chest pain.  Shortness of breath or wheezing.  Ongoing (chronic) cough or a night-time cough.  Wearing away of tooth enamel.  Weight loss. How is this diagnosed? Your health care provider will take a medical history and perform a physical exam. To determine  if you have mild or severe GERD, your health care provider may also monitor how you respond to treatment. You may also have tests, including:  A test to examine your stomach and esophagus with a small camera (endoscopy).  A test thatmeasures the acidity level in your esophagus.  A test thatmeasures how much pressure is on your esophagus.  A barium swallow or modified barium swallow test to show the shape, size, and functioning of your esophagus. How is this treated? The goal of treatment is to help relieve your symptoms and to prevent complications. Treatment for this condition may vary depending on how severe your symptoms are. Your health care provider may recommend:  Changes to your diet.  Medicine.  Surgery. Follow these instructions at home: Eating and drinking   Follow a diet as recommended by your health  care provider. This may involve avoiding foods and drinks such as: ? Coffee and tea (with or without caffeine). ? Drinks that containalcohol. ? Energy drinks and sports drinks. ? Carbonated drinks or sodas. ? Chocolate and cocoa. ? Peppermint and mint flavorings. ? Garlic and onions. ? Horseradish. ? Spicy and acidic foods, including peppers, chili powder, curry powder, vinegar, hot sauces, and barbecue sauce. ? Citrus fruit juices and citrus fruits, such as oranges, lemons, and limes. ? Tomato-based foods, such as red sauce, chili, salsa, and pizza with red sauce. ? Fried and fatty foods, such as donuts, french fries, potato chips, and high-fat dressings. ? High-fat meats, such as hot dogs and fatty cuts of red and white meats, such as rib eye steak, sausage, ham, and bacon. ? High-fat dairy items, such as whole milk, butter, and cream cheese.  Eat small, frequent meals instead of large meals.  Avoid drinking large amounts of liquid with your meals.  Avoid eating meals during the 2-3 hours before bedtime.  Avoid lying down right after you eat.  Do not exercise right after you eat. Lifestyle   Do not use any products that contain nicotine or tobacco, such as cigarettes, e-cigarettes, and chewing tobacco. If you need help quitting, ask your health care provider.  Try to reduce your stress by using methods such as yoga or meditation. If you need help reducing stress, ask your health care provider.  If you are overweight, reduce your weight to an amount that is healthy for you. Ask your health care provider for guidance about a safe weight loss goal. General instructions  Pay attention to any changes in your symptoms.  Take over-the-counter and prescription medicines only as told by your health care provider. Do not take aspirin, ibuprofen, or other NSAIDs unless your health care provider told you to do so.  Wear loose-fitting clothing. Do not wear anything tight around your  waist that causes pressure on your abdomen.  Raise (elevate) the head of your bed about 6 inches (15 cm).  Avoid bending over if this makes your symptoms worse.  Keep all follow-up visits as told by your health care provider. This is important. Contact a health care provider if:  You have: ? New symptoms. ? Unexplained weight loss. ? Difficulty swallowing or it hurts to swallow. ? Wheezing or a persistent cough. ? A hoarse voice.  Your symptoms do not improve with treatment. Get help right away if you:  Have pain in your arms, neck, jaw, teeth, or back.  Feel sweaty, dizzy, or light-headed.  Have chest pain or shortness of breath.  Vomit and your vomit looks like blood or coffee  grounds.  Faint.  Have stool that is bloody or black.  Cannot swallow, drink, or eat. Summary  Gastroesophageal reflux happens when acid from the stomach flows up into the esophagus. GERD is a disease in which the reflux happens often, causes frequent or severe symptoms, or causes problems such as damage to the esophagus.  Treatment for this condition may vary depending on how severe your symptoms are. Your health care provider may recommend diet and lifestyle changes, medicine, or surgery.  Contact a health care provider if you have new or worsening symptoms.  Take over-the-counter and prescription medicines only as told by your health care provider. Do not take aspirin, ibuprofen, or other NSAIDs unless your health care provider told you to do so.  Keep all follow-up visits as told by your health care provider. This is important. This information is not intended to replace advice given to you by your health care provider. Make sure you discuss any questions you have with your health care provider. Document Revised: 03/14/2018 Document Reviewed: 03/14/2018 Elsevier Patient Education  2020 [employer].  Hemorrhoids Hemorrhoids are swollen veins in and around the rectum or anus. There are two  types of hemorrhoids:  Internal hemorrhoids. These occur in the veins that are just inside the rectum. They may poke through to the outside and become irritated and painful.  External hemorrhoids. These occur in the veins that are outside the anus and can be felt as a painful swelling or hard lump near the anus. Most hemorrhoids do not cause serious problems, and they can be managed with home treatments such as diet and lifestyle changes. If home treatments do not help the symptoms, procedures can be done to shrink or remove the hemorrhoids. What are the causes? This condition is caused by increased pressure in the anal area. This pressure may result from various things, including:  Constipation.  Straining to have a bowel movement.  Diarrhea.  Pregnancy.  Obesity.  Sitting for long periods of time.  Heavy lifting or other activity that causes you to strain.  Anal sex.  Riding a bike for a long period of time. What are the signs or symptoms? Symptoms of this condition include:  Pain.  Anal itching or irritation.  Rectal bleeding.  Leakage of stool (feces).  Anal swelling.  One or more lumps around the anus. How is this diagnosed? This condition can often be diagnosed through a visual exam. Other exams or tests may also be done, such as:  An exam that involves feeling the rectal area with a gloved hand (digital rectal exam).  An exam of the anal canal that is done using a small tube (anoscope).  A blood test, if you have lost a significant amount of blood.  A test to look inside the colon using a flexible tube with a camera on the end (sigmoidoscopy or colonoscopy). How is this treated? This condition can usually be treated at home. However, various procedures may be done if dietary changes, lifestyle changes, and other home treatments do not help your symptoms. These procedures can help make the hemorrhoids smaller or remove them completely. Some of these procedures  involve surgery, and others do not. Common procedures include:  Rubber band ligation. Rubber bands are placed at the base of the hemorrhoids to cut off their blood supply.  Sclerotherapy. Medicine is injected into the hemorrhoids to shrink them.  Infrared coagulation. A type of light energy is used to get rid of the hemorrhoids.  Hemorrhoidectomy surgery.  The hemorrhoids are surgically removed, and the veins that supply them are tied off.  Stapled hemorrhoidopexy surgery. The surgeon staples the base of the hemorrhoid to the rectal wall. Follow these instructions at home: Eating and drinking   Eat foods that have a lot of fiber in them, such as whole grains, beans, nuts, fruits, and vegetables.  Ask your health care provider about taking products that have added fiber (fiber supplements).  Reduce the amount of fat in your diet. You can do this by eating low-fat dairy products, eating less red meat, and avoiding processed foods.  Drink enough fluid to keep your urine pale yellow. Managing pain and swelling   Take warm sitz baths for 20 minutes, 3-4 times a day to ease pain and discomfort. You may do this in a bathtub or using a portable sitz bath that fits over the toilet.  If directed, apply ice to the affected area. Using ice packs between sitz baths may be helpful. ? Put ice in a plastic bag. ? Place a towel between your skin and the bag. ? Leave the ice on for 20 minutes, 2-3 times a day. General instructions  Take over-the-counter and prescription medicines only as told by your health care provider.  Use medicated creams or suppositories as told.  Get regular exercise. Ask your health care provider how much and what kind of exercise is best for you. In general, you should do moderate exercise for at least 30 minutes on most days of the week (150 minutes each week). This can include activities such as walking, biking, or yoga.  Go to the bathroom when you have the urge to  have a bowel movement. Do not wait.  Avoid straining to have bowel movements.  Keep the anal area dry and clean. Use wet toilet paper or moist towelettes after a bowel movement.  Do not sit on the toilet for long periods of time. This increases blood pooling and pain.  Keep all follow-up visits as told by your health care provider. This is important. Contact a health care provider if you have:  Increasing pain and swelling that are not controlled by treatment or medicine.  Difficulty having a bowel movement, or you are unable to have a bowel movement.  Pain or inflammation outside the area of the hemorrhoids. Get help right away if you have:  Uncontrolled bleeding from your rectum. Summary  Hemorrhoids are swollen veins in and around the rectum or anus.  Most hemorrhoids can be managed with home treatments such as diet and lifestyle changes.  Taking warm sitz baths can help ease pain and discomfort.  In severe cases, procedures or surgery can be done to shrink or remove the hemorrhoids. This information is not intended to replace advice given to you by your health care provider. Make sure you discuss any questions you have with your health care provider. Document Revised: 02/01/2019 Document Reviewed: 01/25/2018 Elsevier Patient Education  2020 [employer].  Diverticulosis  Diverticulosis is a condition that develops when small pouches (diverticula) form in the wall of the large intestine (colon). The colon is where water is absorbed and stool (feces) is formed. The pouches form when the inside layer of the colon pushes through weak spots in the outer layers of the colon. You may have a few pouches or many of them. The pouches usually do not cause problems unless they become inflamed or infected. When this happens, the condition is called diverticulitis. What are the causes? The cause of  this condition is not known. What increases the risk? The following factors may make you  more likely to develop this condition:  Being older than age 46. Your risk for this condition increases with age. Diverticulosis is rare among people younger than age 11. By age 69, many people have it.  Eating a low-fiber diet.  Having frequent constipation.  Being overweight.  Not getting enough exercise.  Smoking.  Taking over-the-counter pain medicines, like aspirin and ibuprofen.  Having a family history of diverticulosis. What are the signs or symptoms? In most people, there are no symptoms of this condition. If you do have symptoms, they may include:  Bloating.  Cramps in the abdomen.  Constipation or diarrhea.  Pain in the lower left side of the abdomen. How is this diagnosed? Because diverticulosis usually has no symptoms, it is most often diagnosed during an exam for other colon problems. The condition may be diagnosed by:  Using a flexible scope to examine the colon (colonoscopy).  Taking an X-ray of the colon after dye has been put into the colon (barium enema).  Having a CT scan. How is this treated? You may not need treatment for this condition. Your health care provider may recommend treatment to prevent problems. You may need treatment if you have symptoms or if you previously had diverticulitis. Treatment may include:  Eating a high-fiber diet.  Taking a fiber supplement.  Taking a live bacteria supplement (probiotic).  Taking medicine to relax your colon. Follow these instructions at home: Medicines  Take over-the-counter and prescription medicines only as told by your health care provider.  If told by your health care provider, take a fiber supplement or probiotic. Constipation prevention Your condition may cause constipation. To prevent or treat constipation, you may need to:  Drink enough fluid to keep your urine pale yellow.  Take over-the-counter or prescription medicines.  Eat foods that are high in fiber, such as beans, whole grains,  and fresh fruits and vegetables.  Limit foods that are high in fat and processed sugars, such as fried or sweet foods.  General instructions  Try not to strain when you have a bowel movement.  Keep all follow-up visits as told by your health care provider. This is important. Contact a health care provider if you:  Have pain in your abdomen.  Have bloating.  Have cramps.  Have not had a bowel movement in 3 days. Get help right away if:  Your pain gets worse.  Your bloating becomes very bad.  You have a fever or chills, and your symptoms suddenly get worse.  You vomit.  You have bowel movements that are bloody or black.  You have bleeding from your rectum. Summary  Diverticulosis is a condition that develops when small pouches (diverticula) form in the wall of the large intestine (colon).  You may have a few pouches or many of them.  This condition is most often diagnosed during an exam for other colon problems.  Treatment may include increasing the fiber in your diet, taking supplements, or taking medicines. This information is not intended to replace advice given to you by your health care provider. Make sure you discuss any questions you have with your health care provider. Document Revised: 04/04/2019 Document Reviewed: 04/04/2019 Elsevier Patient Education  2020 [employer].

## 2019-11-06 NOTE — H&P (Signed)
@LOGO @   Primary Care Physician:  , MD Primary Gastroenterologist:  Dr. Kirstie Peri  Pre-Procedure History & Physical: HPI:  Tonya Love is a 44 y.o. female here for further evaluation of intermittent esophageal dysphagia, chronic nausea, rectal bleeding and chronic diarrhea.  Past Medical History:  Diagnosis Date  . Anxiety   . Depression   . Migraines   . Pancreatitis     Past Surgical History:  Procedure Laterality Date  . CHOLECYSTECTOMY    . TUBAL LIGATION      Prior to Admission medications   Medication Sig Start Date End Date Taking? Authorizing Provider  ibuprofen (ADVIL,MOTRIN) 200 MG tablet Take 600 mg by mouth every 6 (six) hours as needed for headache.   Yes [provider]  omeprazole (PRILOSEC) 40 MG capsule Take 40 mg by mouth daily. 08/19/19  Yes [provider]  pantoprazole (PROTONIX) 40 MG tablet Take 1 tablet (40 mg total) by mouth 2 (two) times daily. 08/26/19  Yes 14/7/20 S, PA-C  polyethylene glycol-electrolytes (TRILYTE) 420 g solution Take 4,000 mLs by mouth as directed. 08/26/19  Yes Sollie Vultaggio, 14/7/20, MD  rizatriptan (MAXALT-MLT) 10 MG disintegrating tablet Take 1 tablet (10 mg total) by mouth as needed for migraine. May repeat in 2 hours if needed, no more than 2 pills in 24 hours. 08/05/19  Yes 08/07/19, MD  dicyclomine (BENTYL) 10 MG capsule Take 1 capsule by mouth 30 minutes before breakfast. May take up to 3 times daily before meals and 1 capsule at bedtime as needed. Hold for constipation. Patient taking differently: Take 10 mg by mouth daily as needed (Upset stomach). Take 1 capsule by mouth 30 minutes before breakfast. May take up to 3 times daily before meals and 1 capsule at bedtime as needed. Hold for constipation. 08/30/19   14/11/20, PA-C  Fremanezumab-vfrm (AJOVY) 225 MG/1.5ML SOAJ Inject 225 mg into the skin every 30 (thirty) days. 11/04/19   11/06/19, MD  ondansetron (ZOFRAN) 8 MG tablet  Take 1 tablet (8 mg total) by mouth every 8 (eight) hours as needed. Patient not taking: Reported on 11/04/2019 11/04/19   11/06/19, MD    Allergies as of 08/26/2019 - Review Complete 08/26/2019  Allergen Reaction Noted  . Codeine Nausea Only 12/25/2014    Family History  Problem Relation Age of Onset  . Depression Maternal Aunt   . Colon cancer Neg Hx     Social History   Socioeconomic History  . Marital status: Legally Separated    Spouse name: Not on file  . Number of children: Not on file  . Years of education: Not on file  . Highest education level: Not on file  Occupational History  . Not on file  Tobacco Use  . Smoking status: Never Smoker  . Smokeless tobacco: Never Used  Substance and Sexual Activity  . Alcohol use: No  . Drug use: No  . Sexual activity: Yes    Birth control/protection: Pill  Other Topics Concern  . Not on file  Social History Narrative  . Not on file   Social Determinants of Health   Financial Resource Strain:   . Difficulty of Paying Living Expenses: Not on file  Food Insecurity:   . Worried About 02/24/2015 in the Last Year: Not on file  . Ran Out of Food in the Last Year: Not on file  Transportation Needs:   . Lack of Transportation (Medical): Not on file  .  Lack of Transportation (Non-Medical): Not on file  Physical Activity:   . Days of Exercise per Week: Not on file  . Minutes of Exercise per Session: Not on file  Stress:   . Feeling of Stress : Not on file  Social Connections:   . Frequency of Communication with Friends and Family: Not on file  . Frequency of Social Gatherings with Friends and Family: Not on file  . Attends Religious Services: Not on file  . Active Member of Clubs or Organizations: Not on file  . Attends Archivist Meetings: Not on file  . Marital Status: Not on file  Intimate Partner Violence:   . Fear of Current or Ex-Partner: Not on file  . Emotionally Abused: Not on file  .  Physically Abused: Not on file  . Sexually Abused: Not on file    Review of Systems: See HPI, otherwise negative ROS  Physical Exam: BP 121/70   Pulse 84   Temp 98.7 F (37.1 C) (Oral)   Resp 20   Ht 5\' 2"  (1.575 m)   LMP 10/02/2019 (Approximate)   SpO2 100%   BMI 32.01 kg/m  General:   Alert,  Well-developed, well-nourished, pleasant and cooperative in NAD Neck:  Supple; no masses or thyromegaly. No significant cervical adenopathy. Lungs:  Clear throughout to auscultation.   No wheezes, crackles, or rhonchi. No acute distress. Heart:  Regular rate and rhythm; no murmurs, clicks, rubs,  or gallops. Abdomen: Non-distended, normal bowel sounds.  Soft and nontender without appreciable mass or hepatosplenomegaly.  Pulses:  Normal pulses noted. Extremities:  Without clubbing or edema.  Impression/Plan: 44 year old lady with esophageal dysphagia.  Chronic nausea.  No real improvement with collating doses of omeprazole and most recently Protonix 40 mg twice daily.  Chronic diarrhea.  Paper hematochezia.  I have offered the patient both an EGD with esophageal dilation as feasible/appropriate per plan as well as a colonoscopy. The risks, benefits, limitations, imponderables and alternatives regarding both EGD and colonoscopy have been reviewed with the patient. Questions have been answered. All parties agreeable.      Notice: This dictation was prepared with Dragon dictation along with smaller phrase technology. Any transcriptional errors that result from this process are unintentional and may not be corrected upon review.

## 2019-11-06 NOTE — Op Note (Signed)
[facility] Patient Name: Tonya Love Procedure Date: 11/06/2019 2:10 PM MRN: [id number] Date of Birth: [birth year] Attending MD: Gennette Pac , MD CSN: [id number] Age: 44 Admit Type: Outpatient Procedure:                Colonoscopy Indications:              Chronic diarrhea, Hematochezia Providers:                Gennette Pac, MD, Jannett Celestine, RN, Pandora Leiter, Technician Referring MD:              Medicines:                Midazolam 11 mg IV, Meperidine 50 mg IV Complications:            No immediate complications. Estimated Blood Loss:     Estimated blood loss was minimal. Procedure:                Pre-Anesthesia Assessment:                           - Prior to the procedure, a History and Physical                            was performed, and patient medications and                            allergies were reviewed. The patient's tolerance of                            previous anesthesia was also reviewed. The risks                            and benefits of the procedure and the sedation                            options and risks were discussed with the patient.                            All questions were answered, and informed consent                            was obtained. Prior Anticoagulants: The patient has                            taken no previous anticoagulant or antiplatelet                            agents. ASA Grade Assessment: II - A patient with                            mild systemic disease. After reviewing the risks  and benefits, the patient was deemed in                            satisfactory condition to undergo the procedure.                           After obtaining informed consent, the colonoscope                            was passed under direct vision. Throughout the                            procedure, the patient's blood pressure, pulse, and         oxygen saturations were monitored continuously. The                            CF-HQ190L ([id number]) scope was introduced through                            the anus and advanced to the 5 cm into the ileum.                            The terminal ileum, ileocecal valve, appendiceal                            orifice, and rectum were photographed. Scope In: 2:13:57 PM Scope Out: 2:26:43 PM Scope Withdrawal Time: 0 hours 7 minutes 1 second  Total Procedure Duration: 0 hours 12 minutes 46 seconds  Findings:      The perianal and digital rectal examinations were normal. Grade 2       internal hemorrhoids found?"minimal      Scattered medium-mouthed diverticula were found in the sigmoid colon.       Segmental biopsies of the right and left colon taken with cold forceps       for histology. Estimated blood loss was minimal.      The exam was otherwise without abnormality on direct and retroflexion       views. Impression:               - Diverticulosis in the sigmoid colon. Minimal                            grade 2 hemorrhoids. Status post segmental biopsy.                           - The examination was otherwise normal on direct                            and retroflexion views. Moderate Sedation:      Moderate (conscious) sedation was administered by the endoscopy nurse       and supervised by the endoscopist. The following parameters were       monitored: oxygen saturation, heart rate, blood pressure, respiratory       rate, EKG, adequacy of pulmonary ventilation, and response to care.       Total physician intraservice  time was 39 minutes. Recommendation:           - Patient has a contact number available for                            emergencies. The signs and symptoms of potential                            delayed complications were discussed with the                            patient. Return to normal activities tomorrow.                            Written discharge  instructions were provided to the                            patient.                           - Resume previous diet.                           - Continue present medications. Follow-up on                            pathology. See EGD report.                           - Repeat colonoscopy in 10 years for screening                            purposes.                           - Return to GI office in 6 weeks. Procedure Code(s):        --- Professional ---                           [phone], Colonoscopy, flexible; with biopsy, single                            or multiple                           99153, Moderate sedation; each additional 15                            minutes intraservice time                           99153, Moderate sedation; each additional 15                            minutes intraservice time                           G0500, Moderate  sedation services provided by the                            same physician or other qualified health care                            professional performing a gastrointestinal                            endoscopic service that sedation supports,                            requiring the presence of an independent trained                            observer to assist in the monitoring of the                            patient's level of consciousness and physiological                            status; initial 15 minutes of intra-service time;                            patient age 35 years or older (additional time may                            be reported with 16109, as appropriate) Diagnosis Code(s):        --- Professional ---                           K52.9, Noninfective gastroenteritis and colitis,                            unspecified                           K92.1, Melena (includes Hematochezia)                           K57.30, Diverticulosis of large intestine without                            perforation or abscess without  bleeding CPT copyright 2019 American Medical Association. All rights reserved. The codes documented in this report are preliminary and upon coder review may  be revised to meet current compliance requirements. Gerrit Friends. Zaina Jenkin, MD Gennette Pac, MD 11/06/2019 2:32:00 PM This report has been signed electronically. Number of Addenda: 0

## 2019-11-07 NOTE — Telephone Encounter (Signed)
Received PA for Ajovy 225 mg 1.5 mL syringes. Pt was given Ajovy discount/savings card during her 11/04/2019 visit. Pt was educated on how to use card. This should override the insurance denial.

## 2019-11-08 ENCOUNTER — Encounter: Payer: Self-pay | Admitting: Internal Medicine

## 2019-11-08 LAB — SURGICAL PATHOLOGY

## 2019-11-18 DIAGNOSIS — M79671 Pain in right foot: Secondary | ICD-10-CM | POA: Diagnosis not present

## 2019-11-18 DIAGNOSIS — M7661 Achilles tendinitis, right leg: Secondary | ICD-10-CM | POA: Diagnosis not present

## 2019-11-19 ENCOUNTER — Other Ambulatory Visit (HOSPITAL_COMMUNITY): Payer: BC Managed Care – PPO

## 2019-11-26 DIAGNOSIS — Z0000 Encounter for general adult medical examination without abnormal findings: Secondary | ICD-10-CM | POA: Diagnosis not present

## 2019-11-26 DIAGNOSIS — Z299 Encounter for prophylactic measures, unspecified: Secondary | ICD-10-CM | POA: Diagnosis not present

## 2019-11-26 DIAGNOSIS — R5383 Other fatigue: Secondary | ICD-10-CM | POA: Diagnosis not present

## 2019-11-26 DIAGNOSIS — E7800 Pure hypercholesterolemia, unspecified: Secondary | ICD-10-CM | POA: Diagnosis not present

## 2019-11-26 DIAGNOSIS — Z79899 Other long term (current) drug therapy: Secondary | ICD-10-CM | POA: Diagnosis not present

## 2019-11-26 DIAGNOSIS — Z6831 Body mass index (BMI) 31.0-31.9, adult: Secondary | ICD-10-CM | POA: Diagnosis not present

## 2019-11-26 DIAGNOSIS — Z1331 Encounter for screening for depression: Secondary | ICD-10-CM | POA: Diagnosis not present

## 2019-12-03 DIAGNOSIS — Z1231 Encounter for screening mammogram for malignant neoplasm of breast: Secondary | ICD-10-CM | POA: Diagnosis not present

## 2019-12-16 DIAGNOSIS — M779 Enthesopathy, unspecified: Secondary | ICD-10-CM | POA: Diagnosis not present

## 2019-12-16 DIAGNOSIS — M79671 Pain in right foot: Secondary | ICD-10-CM | POA: Diagnosis not present

## 2019-12-16 DIAGNOSIS — S93331D Other subluxation of right foot, subsequent encounter: Secondary | ICD-10-CM | POA: Diagnosis not present

## 2019-12-17 NOTE — Progress Notes (Signed)
Referring Provider: Monico Blitz, MD Primary Care Physician:  Monico Blitz, MD Primary GI Physician: Dr. Gala Romney  Chief Complaint  Patient presents with  . Diarrhea    daily 2-3 times per day  . Nausea    with vomiting every day last week, none this week. at times feels just nauseated    HPI:   Tonya Love is a 44 y.o. female presenting today for follow-up. She was last seen in our office at the time of initial consult on 08/26/2019 for decrease in appetite, early satiety, and nausea with occasional vomiting x5-6,  months, dysphagia, GERD with breakthrough symptoms a couple times a week on omeprazole daily, several years of postprandial diarrhea with occasional cramping prior to BMs that resolves thereafter, new onset of bright red blood per rectum x1 month, anemia which patient reported was chronic with intermittent menorrhagia, and follow-up of recent diverticulitis diagnosed at [facility] with completion of Cipro and Flagyl about 4 weeks prior to office visit with resolution of abdominal pain. Noted taking ibuprofen 80-1000 mg daily for over 1 year. No prior endoscopic evaluation. Plans to proceed with EGD/ED + TCS, stop omeprazole and start Protonix 40 mg twice daily, stop NSAIDs, complete stool studies, celiac serologies, TSH, follow lactose-free diet, and request records from [facility] report completed on 07/19/2019 which did confirm focal acute diverticulitis in the sigmoid colon.   C. difficile and GI pathogen panel were negative. No evidence of celiac disease. Thyroid function normal. She was started on Bentyl 10 mg up to 4 times daily. Repeat CBC on 08/30/2019 with hemoglobin 11.1 (L), iron panel within normal limits but on the lower end of normal with ferritin 29, percent saturation 16, iron 58.  EGD 11/06/2019: Mild Schatzki's ring s/p dilation, abnormal distal esophageal mucosa s/p biopsied, small hiatal hernia, otherwise normal.  Pathology was  benign. TCS 11/06/2019: Diverticulosis in the sigmoid colon, minimal grade 2 hemorrhoids, segmental biopsies performed. Exam was otherwise normal. Recommended repeat colonoscopy in 10 years. Colon biopsies were benign.  Today: Nausea/vomiting: Had nausea with vomiting a couple of days last week.  Friday was the worst. No blood in the vomit. So far this week she has not had any vomiting but does continue with morning nausea. Checked her sugar and it was 54. Feels nauseated when waking up in the morning. Gets home at 12:30 from work and will eat a snack and lay down. Gave example of having a sloppy joe before bed. Notes this does worsen symptoms at time.  Early satiety/decreased appetite: Continues with early satiety and decreased appetite. Has goten worse.  Weight is down about 4 lbs. No change in diet but has started walking more. States she needs to lose some weight.   Dysphagia: Resolved.   GERD: Acid reflux is doing ok with Dexilant. No breakthrough symptoms as long as she takes the medication. Cost $50 a month. Prefers to go back to Protonix as this was free or only $3.   Diarrhea: Every time she eats, she has loose stool with urgency thereafter. Sometimes the stools are watery. Can be up to 4-5 BMs a day depending on what she eats. Imodium will usually help. Not taking bentyl.  States Dr. Gala Romney told her to discontinue this medication.  When she took Bentyl 3 times a day before meals, she would have about 2 BMs a day. Stools were still loose. Occasional nocturnal stools. No significant gas or bloating. Some cramping prior to BMs that resolves  thereafter. Eating fried foods a few times a week.  Milk every morning with cereal.  Rectal bleeding: Intermittent. Once ever couple of weeks. Just a small amount of the toilet tissue. None in the stool or the toilet water. Self resolves after once episode. Doesn't use any creams.   Anemia: Had wellness check with PCP a couple of weeks ago. States her  hemoglobin was good. Reports menorrhagia with passing clots at times.   Thyroid levels were low a couple of weeks ago. She goes back in June for recheck. No new medications were started.   Past Medical History:  Diagnosis Date  . Anxiety   . Depression   . GERD (gastroesophageal reflux disease)   . Migraines   . Pancreatitis     Past Surgical History:  Procedure Laterality Date  . BIOPSY  11/06/2019   Procedure: BIOPSY;  Surgeon: Corbin Ade, MD;  Location: [facility];  Service: Endoscopy;;  . CHOLECYSTECTOMY    . COLONOSCOPY N/A 11/06/2019   Procedure: COLONOSCOPY;  Surgeon: Corbin Ade, diverticulosis in the sigmoid colon, minimal grade 2 hemorrhoids, otherwise normal.  Segmental biopsies benign.  Repeat colonoscopy in 10 years.  . ESOPHAGOGASTRODUODENOSCOPY N/A 11/06/2019   Procedure: ESOPHAGOGASTRODUODENOSCOPY (EGD);  Surgeon: Corbin Ade, MD; mild Schatzki's ring s/p dilation, abnormal distal esophageal mucosa with benign biopsies, small hiatal hernia, otherwise normal.  . MALONEY DILATION N/A 11/06/2019   Procedure: Elease Hashimoto DILATION;  Surgeon: Corbin Ade, MD;  Location: [facility];  Service: Endoscopy;  Laterality: N/A;  . TUBAL LIGATION      Current Outpatient Medications  Medication Sig Dispense Refill  . AJOVY 225 MG/1.5ML SOAJ every 30 (thirty) days.    Marland Kitchen ibuprofen (ADVIL,MOTRIN) 200 MG tablet Take 600 mg by mouth every 6 (six) hours as needed for headache.    . rizatriptan (MAXALT-MLT) 10 MG disintegrating tablet Take 1 tablet (10 mg total) by mouth as needed for migraine. May repeat in 2 hours if needed, no more than 2 pills in 24 hours. 9 tablet 11  . cholestyramine (QUESTRAN) 4 g packet Take 1 packet (4 g total) by mouth daily with breakfast. Mix with at least 4 oz of water. Take other oral medications at least 1 hour before or 4 to 6 hours after cholestyramine 30 each 5  . ondansetron (ZOFRAN) 4 MG tablet Take 1 tablet (4 mg total) by mouth every 8  (eight) hours as needed for nausea or vomiting. 30 tablet 1  . pantoprazole (PROTONIX) 40 MG tablet Take 1 tablet (40 mg total) by mouth 2 (two) times daily. 60 tablet 5   No current facility-administered medications for this visit.    Allergies as of 12/18/2019 - Review Complete 12/18/2019  Allergen Reaction Noted  . Codeine Nausea Only 12/25/2014    Family History  Problem Relation Age of Onset  . Depression Maternal Aunt   . Colon cancer Neg Hx     Social History   Socioeconomic History  . Marital status: Legally Separated    Spouse name: Not on file  . Number of children: Not on file  . Years of education: Not on file  . Highest education level: Not on file  Occupational History  . Not on file  Tobacco Use  . Smoking status: Never Smoker  . Smokeless tobacco: Never Used  Substance and Sexual Activity  . Alcohol use: No  . Drug use: No  . Sexual activity: Yes    Birth control/protection: Pill  Other  Topics Concern  . Not on file  Social History Narrative  . Not on file   Social Determinants of Health   Financial Resource Strain:   . Difficulty of Paying Living Expenses:   Food Insecurity:   . Worried About Programme researcher, broadcasting/film/video in the Last Year:   . Barista in the Last Year:   Transportation Needs:   . Freight forwarder (Medical):   Marland Kitchen Lack of Transportation (Non-Medical):   Physical Activity:   . Days of Exercise per Week:   . Minutes of Exercise per Session:   Stress:   . Feeling of Stress :   Social Connections:   . Frequency of Communication with Friends and Family:   . Frequency of Social Gatherings with Friends and Family:   . Attends Religious Services:   . Active Member of Clubs or Organizations:   . Attends Banker Meetings:   Marland Kitchen Marital Status:     Review of Systems: Gen: Denies fever, chills, presyncope or syncope. CV: Denies chest pain or heart palpitations. Resp: Denies dyspnea at rest or cough. GI: See  HPI Heme: See HPI  Physical Exam: BP 113/74   Pulse 75   Temp (!) 97.5 F (36.4 C) (Oral)   Ht 5\' 2"  (1.575 m)   Wt 174 lb (78.9 kg)   LMP 12/14/2019   BMI 31.83 kg/m  General:   Alert and oriented. No distress noted. Pleasant and cooperative.  Head:  Normocephalic and atraumatic. Eyes:  Conjuctiva clear without scleral icterus. Heart:  S1, S2 present without murmurs appreciated. Lungs:  Clear to auscultation bilaterally. No wheezes, rales, or rhonchi. No distress.  Abdomen:  +BS, soft, non-tender and non-distended. No rebound or guarding. No HSM or masses noted. Msk:  Symmetrical without gross deformities. Normal posture. Extremities:  Without edema. Neurologic:  Alert and  oriented x4 Psych: Normal mood and affect.

## 2019-12-18 ENCOUNTER — Encounter: Payer: Self-pay | Admitting: Gastroenterology

## 2019-12-18 ENCOUNTER — Ambulatory Visit (INDEPENDENT_AMBULATORY_CARE_PROVIDER_SITE_OTHER): Payer: BC Managed Care – PPO | Admitting: Gastroenterology

## 2019-12-18 ENCOUNTER — Other Ambulatory Visit: Payer: Self-pay

## 2019-12-18 VITALS — BP 113/74 | HR 75 | Temp 97.5°F | Ht 62.0 in | Wt 174.0 lb

## 2019-12-18 DIAGNOSIS — R112 Nausea with vomiting, unspecified: Secondary | ICD-10-CM

## 2019-12-18 DIAGNOSIS — R1310 Dysphagia, unspecified: Secondary | ICD-10-CM | POA: Diagnosis not present

## 2019-12-18 DIAGNOSIS — R197 Diarrhea, unspecified: Secondary | ICD-10-CM | POA: Diagnosis not present

## 2019-12-18 DIAGNOSIS — K625 Hemorrhage of anus and rectum: Secondary | ICD-10-CM

## 2019-12-18 DIAGNOSIS — K219 Gastro-esophageal reflux disease without esophagitis: Secondary | ICD-10-CM | POA: Diagnosis not present

## 2019-12-18 DIAGNOSIS — R6881 Early satiety: Secondary | ICD-10-CM | POA: Insufficient documentation

## 2019-12-18 DIAGNOSIS — D649 Anemia, unspecified: Secondary | ICD-10-CM

## 2019-12-18 MED ORDER — PANTOPRAZOLE SODIUM 40 MG PO TBEC: 40.0000 mg | DELAYED_RELEASE_TABLET | Freq: Two times a day (BID) | ORAL | 5 refills | Status: DC

## 2019-12-18 MED ORDER — ONDANSETRON HCL 4 MG PO TABS: 4.0000 mg | ORAL_TABLET | Freq: Three times a day (TID) | ORAL | 1 refills | Status: DC | PRN

## 2019-12-18 MED ORDER — CHOLESTYRAMINE 4 G PO PACK: 4.0000 g | PACK | Freq: Every day | ORAL | 5 refills | Status: DC

## 2019-12-18 NOTE — Assessment & Plan Note (Signed)
Intermittent scant toilet tissue hematochezia once every couple of weeks.  Resolves after one episode.  Does not require any sort of supportive measures.  No constipation.  She does have postprandial diarrhea as discussed above. Colonoscopy 11/06/2019 with diverticulosis in the sigmoid colon, minimal grade 2 hemorrhoids, otherwise normal exam.  Segmental biopsies benign.   Scant toilet tissue hematochezia secondary to internal hemorrhoids.  As symptoms are minimal without need for creams at this time, no need for hemorrhoid banding.  Continue to monitor.

## 2019-12-18 NOTE — Patient Instructions (Addendum)
We will get you scheduled for a gastric emptying study to further evaluate your morning nausea and early satiety.  You can stop Dexilant and I am sending in Protonix 40 mg twice daily as this was more affordable and controlled your reflux symptoms well.  I am sending in Zofran 4 mg every 8 hours as needed to help with your nausea.  Be sure you are following a low-fat diet.  Is also very important that you do not eat anything within 3 hours of laying down.   Start Questran (cholestyramine) 4 g daily with breakfast.  Mix the powder and at least 4 ounces of water.  Take other oral medications at least 1 hour before or 4-6 hours after.  Please follow a lactose-free diet or take Lactaid pills prior to consuming any dairy product.  Almond milk is a good alternative for your cereal.  I recommend you continue to follow with your primary care regarding mild anemia at this may be related to heavy periods.  They can evaluate this further if they feel it is necessary.  We will follow up with you in 4 months.  Call with questions or concerns prior.  Ermalinda Memos, PA-C [facility]

## 2019-12-18 NOTE — Assessment & Plan Note (Addendum)
Patient reported long history of anemia when I saw her in December 2020.  Updated labs.  CBC on 08/20/2019 with hemoglobin at 11.1 (L), up from 10.1 in 2016.  Iron panel within normal limits but on the low end of normal with ferritin 29, percent saturation 16%, and iron 58.  Recent EGD and TCS completed 11/06/2019 without any significant findings to contribute to anemia.  She does have intermittent scant toilet tissue hematochezia which is likely secondary to hemorrhoids noted on colonoscopy.  As bleeding is not significant nor does it require any supportive therapy, no need for banding.  Doubt this very low volume rectal bleeding has any influence on her mild anemia. She does endorse menorrhagia which I suspect is contributing.  Recommended she follow-up with PCP for further management/evaluation as warranted.

## 2019-12-18 NOTE — Assessment & Plan Note (Signed)
GERD symptoms are currently well controlled on Dexilant 60 mg daily.  Patient notes Dexilant cost $50 a month and prefers to return to Protonix 40 mg twice daily as this was much more affordable and also controlled her symptoms well.   Stop Dexilant and start Protonix 40 mg twice daily 30 minutes before breakfast and dinner.  New prescription sent to the pharmacy. Follow-up in 4 months.

## 2019-12-18 NOTE — Progress Notes (Unsigned)
nm

## 2019-12-18 NOTE — Assessment & Plan Note (Addendum)
Addressed under nausea with vomiting.  Plans for gastric emptying study.

## 2019-12-18 NOTE — Assessment & Plan Note (Addendum)
44 year old female with history of GERD who has also had decreased appetite, early satiety, and morning nausea with intermittent vomiting without hematemesis for at least 6 months - 1 year.  She does not have nausea or vomiting throughout the day.  EGD 11/06/2019 with mild Schatzki's ring s/p dilation, abnormal distal esophageal mucosa with benign biopsies, small hiatal hernia, otherwise normal.  GERD symptoms are well controlled on Dexilant 60 mg daily but this has not helped her other upper GI symptoms. Weight is down about 4 lbs over the last 3 months but this is intentional. She does note eating fried/fatty foods at times and also notes eating a "snack" when getting home from work at 12:30 AM just before going to bed (example provided with a sloppy joe).  Additionally, patient reports checking her sugar a couple of times when she was nauseated and her sugar was 54 and 74.  I suspect morning nausea/vomiting is influenced by dietary choices and lifestyle with eating just before laying down.  Query whether she may have delayed gastric emptying contributing to early satiety although she does not have a history of diabetes. We will pursue GES for further evaluation. She will need to follow-up with PCP on her blood sugars which could also be contributing.   Arrange GES Discussed dietary changes.  Advised importance of following a low fat diet and not eating within 3 hours of laying down. Will provide more strict dietary recommendations should her GES come back abnormal.  Change Dexilant to Protonix 40 mg twice daily as this was more affordable and also controls her symptoms well. Zofran 4 mg every 8 hours as needed for nausea. Follow-up with PCP on low blood sugars.  Follow-up in 4 months.

## 2019-12-18 NOTE — Assessment & Plan Note (Addendum)
Resolved.  EGD on 11/06/2019 with mild Schatzki's ring dilated.  GERD symptoms remain well controlled.  She does note Dexilant is $50 a month and she prefers to return to taking Protonix 40 mg twice daily as this was much more affordable and also controlled her symptoms well.  Continue to monitor for return of dysphagia. Stop Dexilant and start Protonix 40 mg twice daily 30 minutes for breakfast and 30 minutes before dinner. Follow-up in 4 months.

## 2019-12-18 NOTE — Assessment & Plan Note (Addendum)
44 year old female with chronic history of postprandial diarrhea with intermittent abdominal cramping prior to BMs that resolves thereafter.  Cholecystectomy many years ago.  Currently taking Imodium as needed which does help some.  Will have between 1-5 mushy to watery BMs daily depending on what she eats.  Occasional nocturnal stools.  Occasional toilet tissue hematochezia.  Colonoscopy 11/06/2019 for follow-up of recent diverticulitis, hematochezia, and diarrhea revealing diverticulosis in the sigmoid colon, minimal grade 2 hemorrhoids, segmental biopsies benign.  Recommended repeat colonoscopy in 10 years.  Stool studies including C. difficile and GI pathogen panel were negative in December 2020.  Celiac serologies negative.  Thyroid function was normal.  Of note, patient reports thyroid function was low at recent check with PCP.  She also notes eating fried/fatty foods at times as well as frequent dairy consumption.  I had tried her on Bentyl up to 4 times daily prior to her colonoscopy which she felt did help some but states Dr. Jena Gauss advised to discontinue this medication.  It is unclear to me as to why the medication was discontinued or if this was truly his recommendation as I do not see this documented.  Patient's diarrhea is likely multifactorial secondary to bile salt diarrhea, dietary intolerances, and possible IBS-D.  Possible influence of thyroid abnormalities. At this time, we will try her on Questran.  Start Questran 4 g daily with breakfast and at least 4 ounces of water.  Advise oral medication to be taken at least 1 hour before or 4 to 6 hours after Questran. May continue to use Imodium as needed. Advised she follow a low-fat diet as well as a lactose-free diet or take Lactaid pills prior to consuming dairy products.  Recommended almond milk as alternative for her daily cereal. Follow-up in 4 months.

## 2019-12-23 ENCOUNTER — Other Ambulatory Visit (HOSPITAL_COMMUNITY): Payer: BC Managed Care – PPO

## 2019-12-27 ENCOUNTER — Other Ambulatory Visit: Payer: Self-pay

## 2019-12-27 ENCOUNTER — Encounter (HOSPITAL_COMMUNITY)
Admission: RE | Admit: 2019-12-27 | Discharge: 2019-12-27 | Disposition: A | Discharge status: Home / Self Care | Payer: BC Managed Care – PPO | Source: Ambulatory Visit | Attending: Gastroenterology | Admitting: Gastroenterology

## 2019-12-27 ENCOUNTER — Encounter (HOSPITAL_COMMUNITY): Payer: Self-pay

## 2019-12-27 DIAGNOSIS — R112 Nausea with vomiting, unspecified: Secondary | ICD-10-CM | POA: Diagnosis not present

## 2019-12-27 DIAGNOSIS — R6881 Early satiety: Secondary | ICD-10-CM | POA: Diagnosis not present

## 2019-12-27 MED ORDER — TECHNETIUM TC 99M SULFUR COLLOID
2.0000 | Freq: Once | INTRAVENOUS | Status: AC | PRN
  Administered 2019-12-27: 08:00:00 2 via ORAL

## 2019-12-29 NOTE — Progress Notes (Signed)
Please let patient know her gastric emptying study is normal.  She should continue with recommendations made at the time of office visit including following a low-fat diet, not eating within 3 hours of laying down, and follow-up with PCP on reported low blood sugars.  We will see her back as scheduled in 4 months.

## 2020-01-20 DIAGNOSIS — L110 Acquired keratosis follicularis: Secondary | ICD-10-CM | POA: Diagnosis not present

## 2020-01-20 DIAGNOSIS — M779 Enthesopathy, unspecified: Secondary | ICD-10-CM | POA: Diagnosis not present

## 2020-01-20 DIAGNOSIS — M79671 Pain in right foot: Secondary | ICD-10-CM | POA: Diagnosis not present

## 2020-01-20 DIAGNOSIS — G5761 Lesion of plantar nerve, right lower limb: Secondary | ICD-10-CM | POA: Diagnosis not present

## 2020-01-23 ENCOUNTER — Telehealth: Payer: Self-pay

## 2020-01-23 NOTE — Telephone Encounter (Signed)
PA was submitted through [email or web address] for Pantoprazole 40 mg bid. Waiting on an approval or denial.

## 2020-01-23 NOTE — Telephone Encounter (Signed)
PA was approved through [email or web address]. approval letter will be scanned in pts chart.

## 2020-02-04 DIAGNOSIS — Z299 Encounter for prophylactic measures, unspecified: Secondary | ICD-10-CM | POA: Diagnosis not present

## 2020-02-04 DIAGNOSIS — M25552 Pain in left hip: Secondary | ICD-10-CM | POA: Diagnosis not present

## 2020-02-24 DIAGNOSIS — G5761 Lesion of plantar nerve, right lower limb: Secondary | ICD-10-CM | POA: Diagnosis not present

## 2020-02-24 DIAGNOSIS — M79674 Pain in right toe(s): Secondary | ICD-10-CM | POA: Diagnosis not present

## 2020-02-24 DIAGNOSIS — M79671 Pain in right foot: Secondary | ICD-10-CM | POA: Diagnosis not present

## 2020-02-26 DIAGNOSIS — R7989 Other specified abnormal findings of blood chemistry: Secondary | ICD-10-CM | POA: Diagnosis not present

## 2020-02-26 DIAGNOSIS — Z299 Encounter for prophylactic measures, unspecified: Secondary | ICD-10-CM | POA: Diagnosis not present

## 2020-02-26 DIAGNOSIS — R110 Nausea: Secondary | ICD-10-CM | POA: Diagnosis not present

## 2020-02-26 DIAGNOSIS — F419 Anxiety disorder, unspecified: Secondary | ICD-10-CM | POA: Diagnosis not present

## 2020-03-10 ENCOUNTER — Telehealth: Payer: Self-pay | Admitting: Neurology

## 2020-03-10 MED ORDER — RIZATRIPTAN BENZOATE 10 MG PO TBDP: 10.0000 mg | ORAL_TABLET | ORAL | 11 refills | Status: DC | PRN

## 2020-03-10 NOTE — Telephone Encounter (Signed)
Pt has called for a refill on her rizatriptan (MAXALT-MLT) 10 MG disintegrating tablet to [facility] [phone]  Pt has been told by the pharmacy that a Prior Auth is needed for the medication.

## 2020-03-10 NOTE — Telephone Encounter (Signed)
Refill sent. I have not received any PA for this med as of yet.

## 2020-03-10 NOTE — Addendum Note (Signed)
Addended by: Ann Maki on: 03/10/2020 05:00 PM   Modules accepted: Orders

## 2020-03-23 DIAGNOSIS — G5761 Lesion of plantar nerve, right lower limb: Secondary | ICD-10-CM | POA: Diagnosis not present

## 2020-03-23 DIAGNOSIS — S134XXA Sprain of ligaments of cervical spine, initial encounter: Secondary | ICD-10-CM | POA: Diagnosis not present

## 2020-03-23 DIAGNOSIS — M779 Enthesopathy, unspecified: Secondary | ICD-10-CM | POA: Diagnosis not present

## 2020-03-23 DIAGNOSIS — S233XXA Sprain of ligaments of thoracic spine, initial encounter: Secondary | ICD-10-CM | POA: Diagnosis not present

## 2020-03-23 DIAGNOSIS — S338XXA Sprain of other parts of lumbar spine and pelvis, initial encounter: Secondary | ICD-10-CM | POA: Diagnosis not present

## 2020-03-23 DIAGNOSIS — M79671 Pain in right foot: Secondary | ICD-10-CM | POA: Diagnosis not present

## 2020-03-24 DIAGNOSIS — M549 Dorsalgia, unspecified: Secondary | ICD-10-CM | POA: Diagnosis not present

## 2020-03-24 DIAGNOSIS — R350 Frequency of micturition: Secondary | ICD-10-CM | POA: Diagnosis not present

## 2020-03-24 DIAGNOSIS — N390 Urinary tract infection, site not specified: Secondary | ICD-10-CM | POA: Diagnosis not present

## 2020-03-24 DIAGNOSIS — Z299 Encounter for prophylactic measures, unspecified: Secondary | ICD-10-CM | POA: Diagnosis not present

## 2020-03-24 DIAGNOSIS — K219 Gastro-esophageal reflux disease without esophagitis: Secondary | ICD-10-CM | POA: Diagnosis not present

## 2020-03-30 DIAGNOSIS — S233XXA Sprain of ligaments of thoracic spine, initial encounter: Secondary | ICD-10-CM | POA: Diagnosis not present

## 2020-03-30 DIAGNOSIS — S134XXA Sprain of ligaments of cervical spine, initial encounter: Secondary | ICD-10-CM | POA: Diagnosis not present

## 2020-03-30 DIAGNOSIS — S338XXA Sprain of other parts of lumbar spine and pelvis, initial encounter: Secondary | ICD-10-CM | POA: Diagnosis not present

## 2020-04-01 NOTE — Progress Notes (Deleted)
Psychiatric Initial Adult Assessment   Patient Identification: Tonya Love MRN:  [id number] Date of Evaluation:  04/01/2020 Referral Source: *** Chief Complaint:   Visit Diagnosis: No diagnosis found.  History of Present Illness:   Tonya Love is a 44 y.o. year old female with a history of depression, anxiety, migraine GERD, pancreatitis, who is referred for         Associated Signs/Symptoms: Depression Symptoms:  {DEPRESSION SYMPTOMS:20000} (Hypo) Manic Symptoms:  {BHH MANIC SYMPTOMS:22872} Anxiety Symptoms:  {BHH ANXIETY SYMPTOMS:22873} Psychotic Symptoms:  {BHH PSYCHOTIC SYMPTOMS:22874} PTSD Symptoms: {BHH PTSD SYMPTOMS:22875}  Past Psychiatric History:  Outpatient:  Psychiatry admission:  Previous suicide attempt:  Past trials of medication:  History of violence:   Previous Psychotropic Medications: {YES/NO:21197}  Substance Abuse History in the last 12 months:  {yes no:314532}  Consequences of Substance Abuse: {BHH CONSEQUENCES OF SUBSTANCE ABUSE:22880}  Past Medical History:  Past Medical History:  Diagnosis Date  . Anxiety   . Depression   . GERD (gastroesophageal reflux disease)   . Migraines   . Pancreatitis     Past Surgical History:  Procedure Laterality Date  . BIOPSY  11/06/2019   Procedure: BIOPSY;  Surgeon: Corbin Ade, MD;  Location: [facility];  Service: Endoscopy;;  . CHOLECYSTECTOMY    . COLONOSCOPY N/A 11/06/2019   Procedure: COLONOSCOPY;  Surgeon: Corbin Ade, diverticulosis in the sigmoid colon, minimal grade 2 hemorrhoids, otherwise normal.  Segmental biopsies benign.  Repeat colonoscopy in 10 years.  . ESOPHAGOGASTRODUODENOSCOPY N/A 11/06/2019   Procedure: ESOPHAGOGASTRODUODENOSCOPY (EGD);  Surgeon: Corbin Ade, MD; mild Schatzki's ring s/p dilation, abnormal distal esophageal mucosa with benign biopsies, small hiatal hernia, otherwise normal.  . MALONEY DILATION N/A 11/06/2019   Procedure: Elease Hashimoto DILATION;   Surgeon: Corbin Ade, MD;  Location: [facility];  Service: Endoscopy;  Laterality: N/A;  . TUBAL LIGATION      Family Psychiatric History: ***  Family History:  Family History  Problem Relation Age of Onset  . Depression Maternal Aunt   . Colon cancer Neg Hx     Social History:   Social History   Socioeconomic History  . Marital status: Legally Separated    Spouse name: Not on file  . Number of children: Not on file  . Years of education: Not on file  . Highest education level: Not on file  Occupational History  . Not on file  Tobacco Use  . Smoking status: Never Smoker  . Smokeless tobacco: Never Used  Vaping Use  . Vaping Use: Never used  Substance and Sexual Activity  . Alcohol use: No  . Drug use: No  . Sexual activity: Yes    Birth control/protection: Pill  Other Topics Concern  . Not on file  Social History Narrative  . Not on file   Social Determinants of Health   Financial Resource Strain:   . Difficulty of Paying Living Expenses:   Food Insecurity:   . Worried About Programme researcher, broadcasting/film/video in the Last Year:   . Barista in the Last Year:   Transportation Needs:   . Freight forwarder (Medical):   Marland Kitchen Lack of Transportation (Non-Medical):   Physical Activity:   . Days of Exercise per Week:   . Minutes of Exercise per Session:   Stress:   . Feeling of Stress :   Social Connections:   . Frequency of Communication with Friends and Family:   . Frequency of Social Gatherings  with Friends and Family:   . Attends Religious Services:   . Active Member of Clubs or Organizations:   . Attends Banker Meetings:   Marland Kitchen Marital Status:     Additional Social History: ***  Allergies:   Allergies  Allergen Reactions  . Codeine Nausea Only    Metabolic Disorder Labs: No results found for: HGBA1C, MPG No results found for: PROLACTIN No results found for: CHOL, TRIG, HDL, CHOLHDL, VLDL, LDLCALC Lab Results  Component Value Date    TSH 0.58 08/26/2019    Therapeutic Level Labs: No results found for: LITHIUM No results found for: CBMZ No results found for: VALPROATE  Current Medications: Current Outpatient Medications  Medication Sig Dispense Refill  . AJOVY 225 MG/1.5ML SOAJ every 30 (thirty) days.    . cholestyramine (QUESTRAN) 4 g packet Take 1 packet (4 g total) by mouth daily with breakfast. Mix with at least 4 oz of water. Take other oral medications at least 1 hour before or 4 to 6 hours after cholestyramine 30 each 5  . ibuprofen (ADVIL,MOTRIN) 200 MG tablet Take 600 mg by mouth every 6 (six) hours as needed for headache.    . ondansetron (ZOFRAN) 4 MG tablet Take 1 tablet (4 mg total) by mouth every 8 (eight) hours as needed for nausea or vomiting. 30 tablet 1  . pantoprazole (PROTONIX) 40 MG tablet Take 1 tablet (40 mg total) by mouth 2 (two) times daily. 60 tablet 5  . rizatriptan (MAXALT-MLT) 10 MG disintegrating tablet Take 1 tablet (10 mg total) by mouth as needed for migraine. May repeat in 2 hours if needed, no more than 2 pills in 24 hours. 9 tablet 11   No current facility-administered medications for this visit.    Musculoskeletal: Strength & Muscle Tone: N/A Gait & Station: N/A Patient leans: N/A  Psychiatric Specialty Exam: Review of Systems  There were no vitals taken for this visit.There is no height or weight on file to calculate BMI.  General Appearance: {Appearance:22683}  Eye Contact:  {BHH EYE CONTACT:22684}  Speech:  Clear and Coherent  Volume:  Normal  Mood:  {BHH MOOD:22306}  Affect:  {Affect (PAA):22687}  Thought Process:  Coherent  Orientation:  Full (Time, Place, and Person)  Thought Content:  Logical  Suicidal Thoughts:  {ST/HT (PAA):22692}  Homicidal Thoughts:  {ST/HT (PAA):22692}  Memory:  Immediate;   Good  Judgement:  {Judgement (PAA):22694}  Insight:  {Insight (PAA):22695}  Psychomotor Activity:  Normal  Concentration:  Concentration: Good and Attention Span:  Good  Recall:  Good  Fund of Knowledge:Good  Language: Good  Akathisia:  No  Handed:  Right  AIMS (if indicated):  not done  Assets:  Communication Skills Desire for Improvement  ADL's:  Intact  Cognition: WNL  Sleep:  {BHH GOOD/FAIR/POOR:22877}   Screenings:   Assessment and Plan:  Assessment  Plan  The patient demonstrates the following risk factors for suicide: Chronic risk factors for suicide include: {Chronic Risk Factors for LOVFIEP:32951884}. Acute risk factors for suicide include: {Acute Risk Factors for ZYSAYTK:16010932}. Protective factors for this patient include: {Protective Factors for Suicide TFTD:32202542}. Considering these factors, the overall suicide risk at this point appears to be {Desc; low/moderate/high:110033}. Patient {ACTION; IS/IS HCW:23762831} appropriate for outpatient follow up.   [name], MD 7/14/20213:05 PM

## 2020-04-02 ENCOUNTER — Telehealth (HOSPITAL_COMMUNITY): Payer: Self-pay | Admitting: Psychiatry

## 2020-04-07 DIAGNOSIS — R05 Cough: Secondary | ICD-10-CM | POA: Diagnosis not present

## 2020-04-07 DIAGNOSIS — J0100 Acute maxillary sinusitis, unspecified: Secondary | ICD-10-CM | POA: Diagnosis not present

## 2020-04-07 DIAGNOSIS — Z6830 Body mass index (BMI) 30.0-30.9, adult: Secondary | ICD-10-CM | POA: Diagnosis not present

## 2020-04-07 DIAGNOSIS — G44209 Tension-type headache, unspecified, not intractable: Secondary | ICD-10-CM | POA: Diagnosis not present

## 2020-04-13 DIAGNOSIS — S134XXA Sprain of ligaments of cervical spine, initial encounter: Secondary | ICD-10-CM | POA: Diagnosis not present

## 2020-04-13 DIAGNOSIS — G5761 Lesion of plantar nerve, right lower limb: Secondary | ICD-10-CM | POA: Diagnosis not present

## 2020-04-13 DIAGNOSIS — S233XXA Sprain of ligaments of thoracic spine, initial encounter: Secondary | ICD-10-CM | POA: Diagnosis not present

## 2020-04-13 DIAGNOSIS — M79671 Pain in right foot: Secondary | ICD-10-CM | POA: Diagnosis not present

## 2020-04-13 DIAGNOSIS — S338XXA Sprain of other parts of lumbar spine and pelvis, initial encounter: Secondary | ICD-10-CM | POA: Diagnosis not present

## 2020-04-15 NOTE — Progress Notes (Signed)
Referring Provider: Kirstie Peri, MD Primary Care Physician:  Kirstie Peri, MD Primary GI Physician: Dr. Jena Gauss  Chief Complaint  Patient presents with   Nausea    almost every morning; no vomiting lately    HPI:   Tonya Love is a 44 y.o. female presenting today for follow-up. History of GERD, dysphagia, early satiety, nausea with vomiting, diverticulitis in 2016 and 2020, postprandial diarrhea with associated abdominal cramping, and toilet tissue hematochezia, mild anemia with iron panel on the low end of normal suspected to be influenced by menorrhagia.  TCS and EGD February 2021 with diverticulosis in sigmoid colon, minimal grade 2 hemorrhoids, and benign random colon biopsies with recommendations to repeat in 10 years.  EGD with mild Schatzki ring s/p dilation, abnormal distal esophageal mucosa with benign biopsy, small hiatal hernia, otherwise normal exam.  C. difficile and GI pathogen panel negative in December 2020.  Thyroid function low end of normal, 0.58.  Celiac serologies negative.  She was last seen 12/08/2019.  Dysphagia had resolved.  GERD well controlled on Dexilant but preferred to resume Protonix due to cost.  She continues with early satiety and morning nausea at times with intermittent vomiting without hematemesis which had been present for 6 months - 1 year.  No nausea throughout the day.  Reported eating a snack when she got home from work in the morning at 12:30 AM just before going to bed.  Some fried/fatty foods.  Previously checked blood sugar when nauseated with sugar 54 and 74.  Continued with postprandial diarrhea with intermittent abdominal cramping with 1-5 mushy to watery BMs daily depending on what she ate.  Occasional nocturnal stools.  Occasional toilet tissue hematochezia suspected to be secondary to hemorrhoids.  Had previously tried her on Bentyl up to 4 times a day which she felt helped but reported Dr. Jena Gauss advise she discontinue the medication; it was  unclear if this was actually his recommendation. Suspected diarrhea is likely multifactorial in the setting of bile salt diarrhea, dietary intolerances, possible IBS-D, possible thyroid abnormalities.  Suspected nausea/vomiting influenced by dietary choices and lifestyle with eating just before lying down.  Also queried gastroparesis/influence of low blood sugar.  Plan to start Questran 4 g daily, continue Imodium as needed, low-fat and lactose-free diet, no eating within 3 hours of laying down, GES, advised to follow-up with PCP regarding blood sugars.  Dexilant was also switched back to Protonix twice daily.  GES 12/27/2019 within normal limits.  Today:  GERD: States she keeps getting omeprazole from her pharmacy rather than Protonix. Taking omeprazole BID. If she remembers to take it, her symptoms are well controlled. This is rare. No dysphagia.   Nausea/Vomiting: Most mornings. No vomiting recently. Noticed Milk caused vomiting so she stopped this. No longer working night shift. Eats dinner around 6-7. Occasionally will have a couple cookies or ice cream before bed. Since changing timing of eating, nausea is not as bad. Will use Zofran a couple times a week. Without zofran, nausea will resolve within a couple of hours. No nausea throughout the rest of the day. No association with eating. Early satiety continues. Present for about 1 year or so at this point. No worsening. 13 lb weight loss in the last 4 months. Not trying to lose weight. Hasn't changed her diet. Hasn't increased exercise. No regular abdominal pain.   Followed up with PCP last week. States they did more blood work and are going to keep an eye on her thyroid function  and blood sugar.    Diarrhea: Hasn't received cholestyramine. Continues with postprandial diarrhea. Continues to take an imodium as needed. Having about 2-3 BMs daily. Mushy to loose. Occasional cramping before BMs but not always. No brbpr or melena.   Ibuprofen once a day.    Past Medical History:  Diagnosis Date   Anxiety    Depression    GERD (gastroesophageal reflux disease)    Migraines    Pancreatitis     Past Surgical History:  Procedure Laterality Date   BIOPSY  11/06/2019   Procedure: BIOPSY;  Surgeon: Corbin Ade, MD;  [facility]: [facility];  Service: Endoscopy;;   CHOLECYSTECTOMY     COLONOSCOPY N/A 11/06/2019   Procedure: COLONOSCOPY;  Surgeon: Corbin Ade, diverticulosis in the sigmoid colon, minimal grade 2 hemorrhoids, otherwise normal.  Segmental biopsies benign.  Repeat colonoscopy in 10 years.   ESOPHAGOGASTRODUODENOSCOPY N/A 11/06/2019   Procedure: ESOPHAGOGASTRODUODENOSCOPY (EGD);  Surgeon: Corbin Ade, MD; mild Schatzki's ring s/p dilation, abnormal distal esophageal mucosa with benign biopsies, small hiatal hernia, otherwise normal.   MALONEY DILATION N/A 11/06/2019   Procedure: Elease Hashimoto DILATION;  Surgeon: Corbin Ade, MD;  Location: [facility];  Service: Endoscopy;  Laterality: N/A;   TUBAL LIGATION      Current Outpatient Medications  Medication Sig Dispense Refill   AJOVY 225 MG/1.5ML SOAJ every 30 (thirty) days.     ibuprofen (ADVIL,MOTRIN) 200 MG tablet Take 600 mg by mouth every 6 (six) hours as needed for headache.     ondansetron (ZOFRAN) 4 MG tablet Take 1 tablet (4 mg total) by mouth every 8 (eight) hours as needed for nausea or vomiting. 30 tablet 1   rizatriptan (MAXALT-MLT) 10 MG disintegrating tablet Take 1 tablet (10 mg total) by mouth as needed for migraine. May repeat in 2 hours if needed, no more than 2 pills in 24 hours. 9 tablet 11   cholestyramine (QUESTRAN) 4 g packet Take 1 packet (4 g total) by mouth daily with breakfast. Mix with at least 4 oz of water. Take other oral medications at least 1 hour before or 4 to 6 hours after cholestyramine (Patient not taking: Reported on 04/16/2020) 30 each 5   pantoprazole (PROTONIX) 40 MG tablet Take 1 tablet (40 mg total) by mouth 2 (two)  times daily. (Patient not taking: Reported on 04/16/2020) 60 tablet 5   No current facility-administered medications for this visit.    Allergies as of 04/16/2020 - Review Complete 04/16/2020  Allergen Reaction Noted   Codeine Nausea Only 12/25/2014    Family History  Problem Relation Age of Onset   Depression Maternal Aunt    Colon cancer Neg Hx     Social History   Socioeconomic History   Marital status: Legally Separated    Spouse name: Not on file   Number of children: Not on file   Years of education: Not on file   Highest education level: Not on file  Occupational History   Not on file  Tobacco Use   Smoking status: Never Smoker   Smokeless tobacco: Never Used  Vaping Use   Vaping Use: Never used  Substance and Sexual Activity   Alcohol use: No   Drug use: No   Sexual activity: Yes    Birth control/protection: Pill  Other Topics Concern   Not on file  Social History Narrative   Not on file   Social Determinants of Health   Financial Resource Strain:  Difficulty of Paying Living Expenses:   Food Insecurity:    Worried About Programme researcher, broadcasting/film/video in the Last Year:    Barista in the Last Year:   Transportation Needs:    Freight forwarder (Medical):    Lack of Transportation (Non-Medical):   Physical Activity:    Days of Exercise per Week:    Minutes of Exercise per Session:   Stress:    Feeling of Stress :   Social Connections:    Frequency of Communication with Friends and Family:    Frequency of Social Gatherings with Friends and Family:    Attends Religious Services:    Active Member of Clubs or Organizations:    Attends Engineer, structural:    Marital Status:     Review of Systems: Gen: Denies fever, chills, cold or flulike symptoms, lightheadedness, dizziness, presyncope, syncope CV: Denies chest pain or palpitations. Resp: Denies dyspnea or cough GI: See HPI Heme: See HPI  Physical  Exam: BP 117/76    Pulse 66    Temp (!) 97.1 F (36.2 C) (Temporal)    Ht 5\' 2"  (1.575 m)    Wt 161 lb 3.2 oz (73.1 kg)    LMP 03/26/2020 (Approximate)    BMI 29.48 kg/m  General:   Alert and oriented. No distress noted. Pleasant and cooperative.  Head:  Normocephalic and atraumatic. Eyes:  Conjuctiva clear without scleral icterus. Heart:  S1, S2 present without murmurs appreciated. Lungs:  Clear to auscultation bilaterally. No wheezes, rales, or rhonchi. No distress.  Abdomen:  +BS, soft, non-tender and non-distended. No rebound or guarding. No HSM or masses noted. Msk:  Symmetrical without gross deformities. Normal posture. Extremities:  Without edema. Neurologic:  Alert and  oriented x4 Psych: Normal mood and affect.

## 2020-04-16 ENCOUNTER — Other Ambulatory Visit: Payer: Self-pay

## 2020-04-16 ENCOUNTER — Telehealth: Payer: Self-pay | Admitting: Emergency Medicine

## 2020-04-16 ENCOUNTER — Ambulatory Visit (INDEPENDENT_AMBULATORY_CARE_PROVIDER_SITE_OTHER): Payer: BC Managed Care – PPO | Admitting: Gastroenterology

## 2020-04-16 ENCOUNTER — Telehealth: Payer: Self-pay

## 2020-04-16 ENCOUNTER — Encounter: Payer: Self-pay | Admitting: Gastroenterology

## 2020-04-16 VITALS — BP 117/76 | HR 66 | Temp 97.1°F | Ht 62.0 in | Wt 161.2 lb

## 2020-04-16 DIAGNOSIS — R112 Nausea with vomiting, unspecified: Secondary | ICD-10-CM

## 2020-04-16 DIAGNOSIS — R634 Abnormal weight loss: Secondary | ICD-10-CM

## 2020-04-16 DIAGNOSIS — R6881 Early satiety: Secondary | ICD-10-CM

## 2020-04-16 DIAGNOSIS — K219 Gastro-esophageal reflux disease without esophagitis: Secondary | ICD-10-CM

## 2020-04-16 DIAGNOSIS — R197 Diarrhea, unspecified: Secondary | ICD-10-CM | POA: Diagnosis not present

## 2020-04-16 NOTE — Patient Instructions (Addendum)
We will arrange for you to have a CT scan.  I am requesting recent labs from your primary care.  We will reach out to your pharmacy to figure out why you have not received Protonix or Questran.  Please follow a strict lactose-free diet.  Follow a low-fat diet.  Avoid fried, fatty, greasy foods.  All meats should be lean (poultry or fish) and baked, boiled, or broiled.  Please limit snacking in the evenings.  Specifically, please limit sugary/fatty foods (cookies and ice cream) in the evenings.  Prop the head of your bed up on wood, bricks, or bed risers to create a 6 inch incline.  Continue Zofran as needed.  We will plan to see back in the office in 3 months.  We will be in touch with you regarding CT scan results.  Do not hesitate to call with questions or concerns.  Ermalinda Memos, PA-C [facility]  Lactose-Controlled Eating Plan, Adult Lactose intolerance is when the body is not able to digest lactose, a natural sugar that is found in milk and milk products. If you are lactose intolerant, avoiding foods and drinks that contain lactose may help ease digestive problems such as diarrhea or stomach pain. What are tips for following this plan? Reading food labels  Check food ingredient lists carefully. Avoid foods made with butter, cream, milk, milk solids, milk powder, curd, caseinate, or whey.  Avoid products with the note "may contain milk."  Look for the words "lactose-free" or "lactose-reduced" on food labels. You can eat lactose-free foods, and you may be able to eat small amounts of lactose-reduced foods. Shopping  Look for nondairy substitutes, such as: ? Nondairy creamer. ? Almond or soy milk. ? Soy or coconut yogurt. ? Dairy-free cheese.  Buy lactose-free cow milk. Cooking  Avoid cooking with butter. Use vegetable, nut, and seed oils instead.  Prepare soups without cream. Use other products to thicken soups, such as corn starch or tomato  paste. Meal planning  Avoid eating foods that contain lactose.  Some people with lactose intolerance can eat foods that contain small amounts of lactose. Foods that contain less than 1 gram of lactose per serving include: ? 1-2 oz of aged cheese, such as Parmesan, Swiss, or cheddar. ? 2 Tbsp of cream cheese. ? ? cup of cottage cheese. ?  cup of ricotta cheese.  Some people are able to tolerate cultured dairy products, such as yogurt, buttermilk, and kefir. The healthy bacteria in these products helps you digest lactose.  If you decide to try a food that contains lactose: ? Eat only one food with lactose in it at a time. ? Eat only a small amount of the food. ? Stop eating the food if your symptoms return. Getting enough calcium  Milk and milk products contain a lot of calcium, which is an important nutrient for your health. When you avoid milk and milk products, make sure to get calcium from other foods.  Talk with your health care provider about how much calcium you need each day. The amount of calcium you need each day depends on your age and overall health.  Nondairy foods that are high in calcium include: ? Sardines and canned salmon. ? Dried beans. ? Almonds. ? Turnip greens, collards, kale, and broccoli. ? Calcium-fortified soy milk and tofu. ? Calcium-fortified orange juice.  Talk with your health care provider about vitamin and mineral supplements. Take supplements only as directed. Summary  Avoiding foods and drinks that contain lactose may help  ease digestive problems such as diarrhea or stomach pain.  When you avoid milk and milk products, make sure to get calcium from other foods.  Take vitamin and mineral supplements only as directed by your health care provider. This information is not intended to replace advice given to you by your health care provider. Make sure you discuss any questions you have with your health care provider. Document Revised: 08/18/2017  Document Reviewed: 12/16/2016 Elsevier Patient Education  2020 [employer].

## 2020-04-16 NOTE — Telephone Encounter (Signed)
CT abd/pelvis w/contrast scheduled for 05/08/20 at 3:00pm, arrive at 2:45pm. NPO 4 hours prior to test and pick up contrast before day of test.  Tried to call pt, no answer, left detailed message on VM to inform her of CT appt and details. Appt letter mailed.

## 2020-04-16 NOTE — Assessment & Plan Note (Signed)
Addressed under nausea with vomiting 

## 2020-04-16 NOTE — Telephone Encounter (Signed)
PA for CT abd/pelvis w/contrast submitted via [employer]. Case approved. PA# [id number], valid 04/16/20-10/13/20.

## 2020-04-16 NOTE — Assessment & Plan Note (Addendum)
44 year old female with history of GERD with 1+ year history of early satiety, morning nausea with intermittent vomiting.  No regular abdominal pain. GERD symptoms are well controlled on PPI twice daily. Notably, patient has discontinued drinking milk which has resolved vomiting.  Intensity of morning nausea has decreased as she is no longer eating dinner within 3 hours of going to bed.  She does continue to eat cookies or ice cream before bed at times.  Her weight had been stable; however, she has documented 13 pound unintentional weight loss over the last 4 months.  Prior evaluation with EGD February 2021 with mild Schatzki ring s/p dilation, abnormal distal esophageal mucosa with benign biopsy, small hiatal hernia, otherwise normal exam.  GES within normal limits of April 2021.  Patient reports having labs completed last week with PCP.  Query possible nocturnal GERD symptoms/lactose intolerance contributing to morning nausea.  Etiology of early satiety and weight loss is not clear.  Will need to pursue CT to rule out occult malignancy.  Plan: CT A/P with contrast. Request recent labs from PCP Continue Zofran as needed. Continue PPI twice daily. (Patient reports receiving omeprazole from the pharmacy although I had sent in Protonix in March 2021.  Pharmacy has been contacted.  They have prescription for Protonix but patient has not picked this up.  She has been notified that she will be receiving Protonix moving forward). Follow lactose-free diet. Follow low-fat diet. Avoid fried, fatty, greasy foods. All meats should be lean (poultry or fish) and baked, boiled, or broiled. Limit snacking in the evenings. Specifically, limit sugary/fatty foods (cookies and ice cream) in the evenings. Prop head of bed up on wood, bricks, or bed risers to create a 6 inch incline. Follow-up in 3 months.

## 2020-04-16 NOTE — Telephone Encounter (Signed)
[facility] received both medications on 12/18/19 pt did not pick up. Pharmacy stated they will get the medications ready for the pt and they can be picked up. Pt notified

## 2020-04-16 NOTE — Telephone Encounter (Signed)
Noted! Thank you

## 2020-04-16 NOTE — Assessment & Plan Note (Signed)
Fairly well controlled as long as she takes PPI twice daily.  Patient reports she continues to be on omeprazole rather than Protonix from her pharmacy.  Not sure how this is I have prescribed Protonix.  We have reached out to the pharmacy.  States they have prior prescription for Protonix but patient has not picked this up.  Patient was notified and aware that she should be receiving Protonix 40 mg twice daily from pharmacy moving forward.  Follow-up in 3 months due to morning nausea, early satiety, and weight loss.

## 2020-04-16 NOTE — Assessment & Plan Note (Addendum)
Chronic history of postprandial diarrhea with intermittent lower abdominal cramping prior to BMs that resolves or after.  History of cholecystectomy.  Typically with 2-3 BMs daily and using Imodium as needed.  Colonoscopy February 2021 with diverticulosis in the sigmoid colon, minimal grade 2 hemorrhoids, segmental biopsies benign.  Due for repeat in 10 years.  Stool studies including C. difficile and GI pathogen panel negative in December 2020.  Celiac serologies negative.  Thyroid function on low end of normal.  Previously tried Bentyl 10 mg which patient felt did help but reported Dr. Jena Gauss advised that she discontinue the medication.  It is unclear to me if this was truly Dr. Luvenia Starch recommendations.  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  We have reached out to patient's pharmacy who states they do have prescription for Questran patient has not picked this up.  Patient was notified that Lanetta Inch was ready at her pharmacy.  Plan: Start Questran 4 mg daily with breakfast. May continue to use Imodium as needed. Follow low-fat diet. Follow lactose-free diet. Follow-up in 3 months.

## 2020-04-19 ENCOUNTER — Telehealth: Payer: Self-pay | Admitting: Gastroenterology

## 2020-04-19 NOTE — Telephone Encounter (Signed)
Received and reviewed labs completed with PCP.  11/26/2019: CBC: WBC 7.2, hemoglobin 11.1, hematocrit 33.8 (L), MCV 96, MCH 31.4, MCHC 32.8, platelets 350 CMP: Glucose 98, BUN 8, creatinine 0.84, sodium 140, potassium 3.8, chloride 103, calcium 9.3, total protein 6.7, albumin 4.3, total bilirubin 0.4, alk phos 74, AST 15, ALT 14 Lipid panel: Total cholesterol 175, triglycerides 76, HDL 45, LDL 116 (H) TSH 0.328 (L)  02/26/2020: TSH 0.483 (very low end of normal) Free T3 3.7 Free T4 0.11 Beta hCG <1  Alicia:  Please let patient know I have reviewed her lab results completed with PCP. Her thyroid function continues to be low or on the very low end of normal. Findings are suggestive that she could be developing subclinical hyperthyroidism which could contribute to the weight loss she is experiencing. She will need to continue following closely with her PCP for this and may need evaluation by endocrinology in the future. Will defer to PCP.

## 2020-04-20 DIAGNOSIS — S338XXA Sprain of other parts of lumbar spine and pelvis, initial encounter: Secondary | ICD-10-CM | POA: Diagnosis not present

## 2020-04-20 DIAGNOSIS — S134XXA Sprain of ligaments of cervical spine, initial encounter: Secondary | ICD-10-CM | POA: Diagnosis not present

## 2020-04-20 DIAGNOSIS — S233XXA Sprain of ligaments of thoracic spine, initial encounter: Secondary | ICD-10-CM | POA: Diagnosis not present

## 2020-04-20 NOTE — Telephone Encounter (Signed)
Spoke with pt. Pt notified that labs were reviewed and she should follow her PCP closely for the thyroid function.

## 2020-04-27 DIAGNOSIS — S338XXA Sprain of other parts of lumbar spine and pelvis, initial encounter: Secondary | ICD-10-CM | POA: Diagnosis not present

## 2020-04-27 DIAGNOSIS — S134XXA Sprain of ligaments of cervical spine, initial encounter: Secondary | ICD-10-CM | POA: Diagnosis not present

## 2020-04-27 DIAGNOSIS — S233XXA Sprain of ligaments of thoracic spine, initial encounter: Secondary | ICD-10-CM | POA: Diagnosis not present

## 2020-04-30 ENCOUNTER — Other Ambulatory Visit: Payer: Self-pay | Admitting: Neurology

## 2020-05-04 DIAGNOSIS — S134XXA Sprain of ligaments of cervical spine, initial encounter: Secondary | ICD-10-CM | POA: Diagnosis not present

## 2020-05-04 DIAGNOSIS — S233XXA Sprain of ligaments of thoracic spine, initial encounter: Secondary | ICD-10-CM | POA: Diagnosis not present

## 2020-05-04 DIAGNOSIS — S338XXA Sprain of other parts of lumbar spine and pelvis, initial encounter: Secondary | ICD-10-CM | POA: Diagnosis not present

## 2020-05-05 ENCOUNTER — Encounter: Payer: Self-pay | Admitting: Adult Health

## 2020-05-05 ENCOUNTER — Other Ambulatory Visit: Payer: Self-pay

## 2020-05-05 ENCOUNTER — Ambulatory Visit (INDEPENDENT_AMBULATORY_CARE_PROVIDER_SITE_OTHER): Payer: BC Managed Care – PPO | Admitting: Adult Health

## 2020-05-05 VITALS — BP 104/69 | HR 82 | Ht 62.0 in | Wt 163.4 lb

## 2020-05-05 DIAGNOSIS — G43709 Chronic migraine without aura, not intractable, without status migrainosus: Secondary | ICD-10-CM

## 2020-05-05 MED ORDER — RIZATRIPTAN BENZOATE 10 MG PO TBDP: 10.0000 mg | ORAL_TABLET | ORAL | 11 refills | Status: DC | PRN

## 2020-05-05 MED ORDER — AJOVY 225 MG/1.5ML ~~LOC~~ SOAJ: SUBCUTANEOUS | 11 refills | Status: DC

## 2020-05-05 NOTE — Progress Notes (Addendum)
PATIENT: Tonya Love DOB: [birth year]  REASON FOR VISIT: follow up HISTORY FROM: patient  HISTORY OF PRESENT ILLNESS: Today 05/05/20:  Tonya Love is a 44 year old female with a history of migraine. She returns today for follow-up. She remains on Ajovy.  She reports that this controls her headache.  She typically gets headaches the week before the next dose is due.  She states that her headaches typically occur across the forehead.  She does have photophobia and phonophobia.  On occasion she will have nausea.  She reports that Maxalt continues to give her good benefit.  On occasion she will have to take a second dose.  HISTORY 11/04/2019: She reports that the Maxalt helps, some days better than others, she needs a prescription for Zofran as she is out.  Her migraine frequency has increased a little bit, she can have 2 or 3 migraines per week, they can last 24 to 36 hours.  She has not been on preventative medication in the past but has tried as needed medication in the past.  She has had some intermittent palpitations and chest discomfort, also jitteriness.  This is not after taking Maxalt actually.  It can be when she is just sitting and she feels unwell, she has noticed on her health tracker watch that her pulse rate can go up to the 150s, yesterday she had a similar episode in her pulse rate was 158.  She has not seen her primary care physician or nurse practitioner yet for this.  She has not seen a cardiologist in the past.  She has not seen a correlation between her caffeine intake and her symptoms and also she had not skipped any meals.  She tries to hydrate well with water, drinks caffeine in the form of Scnetx occasionally, not every day, does not like coffee.  The patient's allergies, current medications, family history, past medical history, past social history, past surgical history and problem list were reviewed and updated as appropriate.     REVIEW OF SYSTEMS: Out of  a complete 14 system review of symptoms, the patient complains only of the following symptoms, and all other reviewed systems are negative.  See HPI  ALLERGIES: Allergies  Allergen Reactions  . Codeine Nausea Only    HOME MEDICATIONS: Outpatient Medications Prior to Visit  Medication Sig Dispense Refill  . AJOVY 225 MG/1.5ML SOAJ INJECT 225 MG INTO THE SKIN EVERY 30 DAYS 1.5 mL 5  . cholestyramine (QUESTRAN) 4 g packet Take 1 packet (4 g total) by mouth daily with breakfast. Mix with at least 4 oz of water. Take other oral medications at least 1 hour before or 4 to 6 hours after cholestyramine 30 each 5  . ibuprofen (ADVIL,MOTRIN) 200 MG tablet Take 600 mg by mouth every 6 (six) hours as needed for headache.    . ondansetron (ZOFRAN) 4 MG tablet Take 1 tablet (4 mg total) by mouth every 8 (eight) hours as needed for nausea or vomiting. 30 tablet 1  . pantoprazole (PROTONIX) 40 MG tablet Take 1 tablet (40 mg total) by mouth 2 (two) times daily. 60 tablet 5  . rizatriptan (MAXALT-MLT) 10 MG disintegrating tablet Take 1 tablet (10 mg total) by mouth as needed for migraine. May repeat in 2 hours if needed, no more than 2 pills in 24 hours. 9 tablet 11   No facility-administered medications prior to visit.    PAST MEDICAL HISTORY: Past Medical History:  Diagnosis Date  .  Anxiety   . Depression   . GERD (gastroesophageal reflux disease)   . Migraines   . Pancreatitis     PAST SURGICAL HISTORY: Past Surgical History:  Procedure Laterality Date  . BIOPSY  11/06/2019   Procedure: BIOPSY;  Surgeon: Corbin Ade, MD;  Location: [facility];  Service: Endoscopy;;  . CHOLECYSTECTOMY    . COLONOSCOPY N/A 11/06/2019   Procedure: COLONOSCOPY;  Surgeon: Corbin Ade, diverticulosis in the sigmoid colon, minimal grade 2 hemorrhoids, otherwise normal.  Segmental biopsies benign.  Repeat colonoscopy in 10 years.  . ESOPHAGOGASTRODUODENOSCOPY N/A 11/06/2019   Procedure:  ESOPHAGOGASTRODUODENOSCOPY (EGD);  Surgeon: Corbin Ade, MD; mild Schatzki's ring s/p dilation, abnormal distal esophageal mucosa with benign biopsies, small hiatal hernia, otherwise normal.  . MALONEY DILATION N/A 11/06/2019   Procedure: Elease Hashimoto DILATION;  Surgeon: Corbin Ade, MD;  Location: [facility];  Service: Endoscopy;  Laterality: N/A;  . TUBAL LIGATION      FAMILY HISTORY: Family History  Problem Relation Age of Onset  . Depression Maternal Aunt   . Colon cancer Neg Hx     SOCIAL HISTORY: Social History   Socioeconomic History  . Marital status: Legally Separated    Spouse name: Not on file  . Number of children: Not on file  . Years of education: Not on file  . Highest education level: Not on file  Occupational History  . Not on file  Tobacco Use  . Smoking status: Never Smoker  . Smokeless tobacco: Never Used  Vaping Use  . Vaping Use: Never used  Substance and Sexual Activity  . Alcohol use: No  . Drug use: No  . Sexual activity: Yes    Birth control/protection: Pill  Other Topics Concern  . Not on file  Social History Narrative  . Not on file   Social Determinants of Health   Financial Resource Strain:   . Difficulty of Paying Living Expenses:   Food Insecurity:   . Worried About Programme researcher, broadcasting/film/video in the Last Year:   . Barista in the Last Year:   Transportation Needs:   . Freight forwarder (Medical):   Marland Kitchen Lack of Transportation (Non-Medical):   Physical Activity:   . Days of Exercise per Week:   . Minutes of Exercise per Session:   Stress:   . Feeling of Stress :   Social Connections:   . Frequency of Communication with Friends and Family:   . Frequency of Social Gatherings with Friends and Family:   . Attends Religious Services:   . Active Member of Clubs or Organizations:   . Attends Banker Meetings:   Marland Kitchen Marital Status:   Intimate Partner Violence:   . Fear of Current or Ex-Partner:   . Emotionally  Abused:   Marland Kitchen Physically Abused:   . Sexually Abused:       PHYSICAL EXAM  Vitals:   05/05/20 0905  BP: 104/69  Pulse: 82  Weight: 163 lb 6.4 oz (74.1 kg)  Height: 5\' 2"  (1.575 m)   Body mass index is 29.89 kg/m.  Generalized: Well developed, in no acute distress   Neurological examination  Mentation: Alert oriented to time, place, history taking. Follows all commands speech and language fluent Cranial nerve II-XII: Pupils were equal round reactive to light. Extraocular movements were full, visual field were full on confrontational test.. Head turning and shoulder shrug  were normal and symmetric. Motor: The motor testing reveals 5  over 5 strength of all 4 extremities. Good symmetric motor tone is noted throughout.  Sensory: Sensory testing is intact to soft touch on all 4 extremities. No evidence of extinction is noted.  Coordination: Cerebellar testing reveals good finger-nose-finger and heel-to-shin bilaterally.  Gait and station: Gait is normal.  Reflexes: Deep tendon reflexes are symmetric and normal bilaterally.   DIAGNOSTIC DATA (LABS, IMAGING, TESTING) - I reviewed patient records, labs, notes, testing and imaging myself where available.  Lab Results  Component Value Date   WBC 7.7 08/30/2019   HGB 11.1 (L) 08/30/2019   HCT 33.0 (L) 08/30/2019   MCV 93.2 08/30/2019   PLT 343 08/30/2019      Component Value Date/Time   NA 143 08/26/2019 1551   NA 140 08/05/2019 1215   K 4.2 08/26/2019 1551   CL 104 08/26/2019 1551   CO2 20 08/26/2019 1551   GLUCOSE 88 08/26/2019 1551   BUN 8 08/26/2019 1551   BUN 7 08/05/2019 1215   CREATININE 0.96 08/26/2019 1551   CALCIUM 9.5 08/26/2019 1551   PROT 7.0 08/05/2019 1215   ALBUMIN 4.3 08/05/2019 1215   AST 13 08/05/2019 1215   ALT 13 08/05/2019 1215   ALKPHOS 72 08/05/2019 1215   BILITOT 0.3 08/05/2019 1215   GFRNONAA 73 08/26/2019 1551   GFRAA 85 08/26/2019 1551    Lab Results  Component Value Date   TSH 0.58  08/26/2019      ASSESSMENT AND PLAN 44 y.o. year old female  has a past medical history of Anxiety, Depression, GERD (gastroesophageal reflux disease), Migraines, and Pancreatitis. here with:  1.  Migraine headaches   Continue Ajovy  Continue Maxalt  Advised if her headache frequency increases she should let us know  Follow-up in 1 year or sooner if needed   I spent 20 minutes of face-to-face and non-face-to-face time with patient.  This included previsit chart review, lab review, study review, order entry, electronic health record documentation, patient education.  [name], MSN, NP-C 05/05/2020, 9:12 AM [facility] [address] [place], [place] [address] [phone]  I reviewed the above note and documentation by the Nurse Practitioner and agree with the history, exam, assessment and plan as outlined above. I was available for consultation. Huston Foley, MD, PhD [facility])

## 2020-05-05 NOTE — Patient Instructions (Signed)
Your Plan:  Continue Ajovy Continue Maxalt  If your symptoms worsen or you develop new symptoms please let us know.       Thank you for coming to see [place] at [facility]. I hope we have been able to provide you high quality care today.  You may receive a patient satisfaction survey over the next few weeks. We would appreciate your feedback and comments so that we may continue to improve ourselves and the health of our patients.

## 2020-05-06 ENCOUNTER — Telehealth (HOSPITAL_COMMUNITY): Payer: Self-pay | Admitting: Psychiatry

## 2020-05-08 ENCOUNTER — Ambulatory Visit (HOSPITAL_COMMUNITY)
Admission: RE | Admit: 2020-05-08 | Discharge: 2020-05-08 | Disposition: A | Discharge status: Home / Self Care | Payer: BC Managed Care – PPO | Source: Ambulatory Visit | Attending: Gastroenterology | Admitting: Gastroenterology

## 2020-05-08 ENCOUNTER — Other Ambulatory Visit: Payer: Self-pay

## 2020-05-08 DIAGNOSIS — R1110 Vomiting, unspecified: Secondary | ICD-10-CM | POA: Diagnosis not present

## 2020-05-08 DIAGNOSIS — R634 Abnormal weight loss: Secondary | ICD-10-CM | POA: Diagnosis not present

## 2020-05-08 DIAGNOSIS — R6881 Early satiety: Secondary | ICD-10-CM

## 2020-05-08 DIAGNOSIS — K5730 Diverticulosis of large intestine without perforation or abscess without bleeding: Secondary | ICD-10-CM | POA: Diagnosis not present

## 2020-05-08 DIAGNOSIS — R112 Nausea with vomiting, unspecified: Secondary | ICD-10-CM | POA: Insufficient documentation

## 2020-05-08 LAB — POCT I-STAT CREATININE: Creatinine, Ser: 0.8 mg/dL (ref 0.44–1.00)

## 2020-05-08 MED ORDER — IOHEXOL 300 MG/ML  SOLN
100.0000 mL | Freq: Once | INTRAMUSCULAR | Status: AC | PRN
  Administered 2020-05-08: 100 mL via INTRAVENOUS

## 2020-05-11 DIAGNOSIS — S338XXA Sprain of other parts of lumbar spine and pelvis, initial encounter: Secondary | ICD-10-CM | POA: Diagnosis not present

## 2020-05-11 DIAGNOSIS — S233XXA Sprain of ligaments of thoracic spine, initial encounter: Secondary | ICD-10-CM | POA: Diagnosis not present

## 2020-05-11 DIAGNOSIS — S134XXA Sprain of ligaments of cervical spine, initial encounter: Secondary | ICD-10-CM | POA: Diagnosis not present
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
## 2020-05-11 NOTE — Progress Notes (Signed)
No findings on CT to explain early satiety, morning nausea, or weight loss.   Please see how she is doing. Has she started Protonix BID and stopped omeprazole?  Is she following a complete lactose free diet and low fat diet?  Limiting snacking in the evening prior to bed?  Propped the head of her bed up to create a 6 inch incline?

## 2020-05-14 ENCOUNTER — Other Ambulatory Visit: Payer: Self-pay

## 2020-05-14 ENCOUNTER — Telehealth (INDEPENDENT_AMBULATORY_CARE_PROVIDER_SITE_OTHER): Payer: BC Managed Care – PPO | Admitting: Psychiatry

## 2020-05-14 ENCOUNTER — Encounter (HOSPITAL_COMMUNITY): Payer: Self-pay | Admitting: Psychiatry

## 2020-05-14 DIAGNOSIS — F331 Major depressive disorder, recurrent, moderate: Secondary | ICD-10-CM

## 2020-05-14 MED ORDER — MIRTAZAPINE 15 MG PO TABS: ORAL_TABLET | ORAL | 1 refills | Status: DC

## 2020-05-18 DIAGNOSIS — M79671 Pain in right foot: Secondary | ICD-10-CM | POA: Diagnosis not present

## 2020-05-18 DIAGNOSIS — S338XXA Sprain of other parts of lumbar spine and pelvis, initial encounter: Secondary | ICD-10-CM | POA: Diagnosis not present

## 2020-05-18 DIAGNOSIS — S233XXA Sprain of ligaments of thoracic spine, initial encounter: Secondary | ICD-10-CM | POA: Diagnosis not present

## 2020-05-18 DIAGNOSIS — S134XXA Sprain of ligaments of cervical spine, initial encounter: Secondary | ICD-10-CM | POA: Diagnosis not present

## 2020-05-18 DIAGNOSIS — M7661 Achilles tendinitis, right leg: Secondary | ICD-10-CM | POA: Diagnosis not present

## 2020-05-20 ENCOUNTER — Ambulatory Visit (INDEPENDENT_AMBULATORY_CARE_PROVIDER_SITE_OTHER): Payer: BC Managed Care – PPO | Admitting: Clinical

## 2020-05-20 ENCOUNTER — Other Ambulatory Visit: Payer: Self-pay

## 2020-05-20 DIAGNOSIS — F331 Major depressive disorder, recurrent, moderate: Secondary | ICD-10-CM

## 2020-05-20 DIAGNOSIS — F419 Anxiety disorder, unspecified: Secondary | ICD-10-CM | POA: Diagnosis not present
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
## 2020-05-27 DIAGNOSIS — S338XXA Sprain of other parts of lumbar spine and pelvis, initial encounter: Secondary | ICD-10-CM | POA: Diagnosis not present

## 2020-05-27 DIAGNOSIS — S233XXA Sprain of ligaments of thoracic spine, initial encounter: Secondary | ICD-10-CM | POA: Diagnosis not present

## 2020-05-27 DIAGNOSIS — S134XXA Sprain of ligaments of cervical spine, initial encounter: Secondary | ICD-10-CM | POA: Diagnosis not present

## 2020-06-03 DIAGNOSIS — S338XXA Sprain of other parts of lumbar spine and pelvis, initial encounter: Secondary | ICD-10-CM | POA: Diagnosis not present

## 2020-06-03 DIAGNOSIS — S134XXA Sprain of ligaments of cervical spine, initial encounter: Secondary | ICD-10-CM | POA: Diagnosis not present

## 2020-06-03 DIAGNOSIS — S233XXA Sprain of ligaments of thoracic spine, initial encounter: Secondary | ICD-10-CM | POA: Diagnosis not present
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
## 2020-06-15 DIAGNOSIS — S134XXA Sprain of ligaments of cervical spine, initial encounter: Secondary | ICD-10-CM | POA: Diagnosis not present

## 2020-06-15 DIAGNOSIS — S338XXA Sprain of other parts of lumbar spine and pelvis, initial encounter: Secondary | ICD-10-CM | POA: Diagnosis not present

## 2020-06-15 DIAGNOSIS — S233XXA Sprain of ligaments of thoracic spine, initial encounter: Secondary | ICD-10-CM | POA: Diagnosis not present

## 2020-06-17 ENCOUNTER — Ambulatory Visit (INDEPENDENT_AMBULATORY_CARE_PROVIDER_SITE_OTHER): Payer: BC Managed Care – PPO | Admitting: Clinical

## 2020-06-17 ENCOUNTER — Other Ambulatory Visit: Payer: Self-pay

## 2020-06-17 DIAGNOSIS — F419 Anxiety disorder, unspecified: Secondary | ICD-10-CM | POA: Diagnosis not present

## 2020-06-17 DIAGNOSIS — F331 Major depressive disorder, recurrent, moderate: Secondary | ICD-10-CM | POA: Diagnosis not present
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
## 2020-06-18 ENCOUNTER — Telehealth (HOSPITAL_COMMUNITY): Payer: BC Managed Care – PPO | Admitting: Psychiatry

## 2020-06-22 DIAGNOSIS — S338XXA Sprain of other parts of lumbar spine and pelvis, initial encounter: Secondary | ICD-10-CM | POA: Diagnosis not present

## 2020-06-22 DIAGNOSIS — S134XXA Sprain of ligaments of cervical spine, initial encounter: Secondary | ICD-10-CM | POA: Diagnosis not present

## 2020-06-22 DIAGNOSIS — S233XXA Sprain of ligaments of thoracic spine, initial encounter: Secondary | ICD-10-CM | POA: Diagnosis not present

## 2020-06-29 DIAGNOSIS — S338XXA Sprain of other parts of lumbar spine and pelvis, initial encounter: Secondary | ICD-10-CM | POA: Diagnosis not present

## 2020-06-29 DIAGNOSIS — S233XXA Sprain of ligaments of thoracic spine, initial encounter: Secondary | ICD-10-CM | POA: Diagnosis not present

## 2020-06-29 DIAGNOSIS — S134XXA Sprain of ligaments of cervical spine, initial encounter: Secondary | ICD-10-CM | POA: Diagnosis not present
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
## 2020-07-06 ENCOUNTER — Telehealth (INDEPENDENT_AMBULATORY_CARE_PROVIDER_SITE_OTHER): Payer: BC Managed Care – PPO | Admitting: Psychiatry

## 2020-07-06 ENCOUNTER — Encounter (HOSPITAL_COMMUNITY): Payer: Self-pay | Admitting: Psychiatry

## 2020-07-06 ENCOUNTER — Other Ambulatory Visit: Payer: Self-pay

## 2020-07-06 DIAGNOSIS — S233XXA Sprain of ligaments of thoracic spine, initial encounter: Secondary | ICD-10-CM | POA: Diagnosis not present

## 2020-07-06 DIAGNOSIS — F330 Major depressive disorder, recurrent, mild: Secondary | ICD-10-CM

## 2020-07-06 DIAGNOSIS — S134XXA Sprain of ligaments of cervical spine, initial encounter: Secondary | ICD-10-CM | POA: Diagnosis not present

## 2020-07-06 DIAGNOSIS — S338XXA Sprain of other parts of lumbar spine and pelvis, initial encounter: Secondary | ICD-10-CM | POA: Diagnosis not present

## 2020-07-09 DIAGNOSIS — M2011 Hallux valgus (acquired), right foot: Secondary | ICD-10-CM | POA: Diagnosis not present

## 2020-07-09 DIAGNOSIS — M7661 Achilles tendinitis, right leg: Secondary | ICD-10-CM | POA: Diagnosis not present

## 2020-07-09 DIAGNOSIS — M79674 Pain in right toe(s): Secondary | ICD-10-CM | POA: Diagnosis not present

## 2020-07-09 DIAGNOSIS — M79671 Pain in right foot: Secondary | ICD-10-CM | POA: Diagnosis not present

## 2020-07-13 DIAGNOSIS — S338XXA Sprain of other parts of lumbar spine and pelvis, initial encounter: Secondary | ICD-10-CM | POA: Diagnosis not present

## 2020-07-13 DIAGNOSIS — S233XXA Sprain of ligaments of thoracic spine, initial encounter: Secondary | ICD-10-CM | POA: Diagnosis not present

## 2020-07-13 DIAGNOSIS — S134XXA Sprain of ligaments of cervical spine, initial encounter: Secondary | ICD-10-CM | POA: Diagnosis not present

## 2020-07-16 ENCOUNTER — Ambulatory Visit: Payer: BC Managed Care – PPO | Admitting: Gastroenterology

## 2020-07-17 DIAGNOSIS — M25512 Pain in left shoulder: Secondary | ICD-10-CM | POA: Diagnosis not present

## 2020-07-17 DIAGNOSIS — S0003XA Contusion of scalp, initial encounter: Secondary | ICD-10-CM | POA: Diagnosis not present

## 2020-07-17 DIAGNOSIS — M25561 Pain in right knee: Secondary | ICD-10-CM | POA: Diagnosis not present

## 2020-07-17 DIAGNOSIS — S8001XA Contusion of right knee, initial encounter: Secondary | ICD-10-CM | POA: Diagnosis not present

## 2020-07-17 DIAGNOSIS — S0990XA Unspecified injury of head, initial encounter: Secondary | ICD-10-CM | POA: Diagnosis not present

## 2020-07-17 DIAGNOSIS — Z87891 Personal history of nicotine dependence: Secondary | ICD-10-CM | POA: Diagnosis not present

## 2020-07-17 DIAGNOSIS — Z041 Encounter for examination and observation following transport accident: Secondary | ICD-10-CM | POA: Diagnosis not present

## 2020-07-22 DIAGNOSIS — S233XXA Sprain of ligaments of thoracic spine, initial encounter: Secondary | ICD-10-CM | POA: Diagnosis not present

## 2020-07-22 DIAGNOSIS — S338XXA Sprain of other parts of lumbar spine and pelvis, initial encounter: Secondary | ICD-10-CM | POA: Diagnosis not present

## 2020-07-22 DIAGNOSIS — S134XXA Sprain of ligaments of cervical spine, initial encounter: Secondary | ICD-10-CM | POA: Diagnosis not present

## 2020-07-28 ENCOUNTER — Other Ambulatory Visit: Payer: Self-pay

## 2020-07-28 ENCOUNTER — Ambulatory Visit (INDEPENDENT_AMBULATORY_CARE_PROVIDER_SITE_OTHER): Payer: BC Managed Care – PPO | Admitting: Clinical

## 2020-07-28 DIAGNOSIS — F331 Major depressive disorder, recurrent, moderate: Secondary | ICD-10-CM | POA: Diagnosis not present

## 2020-07-28 DIAGNOSIS — F419 Anxiety disorder, unspecified: Secondary | ICD-10-CM | POA: Diagnosis not present
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
## 2020-07-31 IMAGING — NM NM GASTRIC EMPTYING
8 series · 8 of 8 positions shown · non-contrast
Comparison: None.

CLINICAL DATA: Early satiety with nausea and vomiting

EXAM:
NUCLEAR MEDICINE GASTRIC EMPTYING SCAN
TECHNIQUE: After oral ingestion of radiolabeled meal, sequential abdominal
images were obtained for 3 hours. Percentage of activity emptying
the stomach was calculated at 1 hour, 2 hours, and 3 hours post
radiotracer administration.
RADIOPHARMACEUTICALS:  2.0 mCi 1c-NNm sulfur colloid in standardized
meal including egg

[Series 1: 0 min · 4.14mm/px · 1 of 1 slices shown (1 of 2)]
[im 1/1]
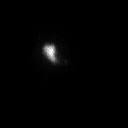

[Series 1: 0 min · 4.14mm/px · 1 of 1 slices shown (2 of 2)]
[im 1/1]
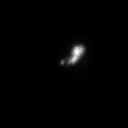

[Series 1: 120 min · 4.14mm/px · 1 of 1 slices shown (1 of 4)]
[im 1/1]
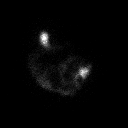

[Series 1: 120 min · 4.14mm/px · 1 of 1 slices shown (2 of 4)]
[im 1/1]
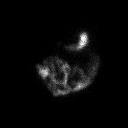

[Series 2: 60 min · 4.14mm/px · 1 of 1 slices shown (1 of 2)]
[im 1/1]
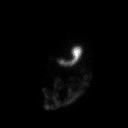

[Series 2: 120 min · 4.14mm/px · 1 of 1 slices shown (3 of 4)]
[im 1/1]
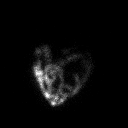

[Series 2: 120 min · 4.14mm/px · 1 of 1 slices shown (4 of 4)]
[im 1/1]
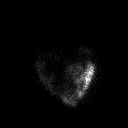

[Series 2: 60 min · 4.14mm/px · 1 of 1 slices shown (2 of 2)]
[im 1/1]
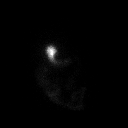

[8 of 8 positions shown; findings below may reference images not displayed]

FINDINGS: Expected location of the stomach in the left upper quadrant.
Ingested meal empties the stomach gradually over the course of the
study.

38.3% emptied at 1 hr ( normal >= 10%)

70.7% emptied at 2 hr ( normal >= 40%)

96.5% emptied at 3 hr ( normal >= 70%)

Normal emptying of greater than 90% at 4 hours post radiotracer
administration.
IMPRESSION: Normal gastric emptying study.

## 2020-08-03 DIAGNOSIS — S233XXA Sprain of ligaments of thoracic spine, initial encounter: Secondary | ICD-10-CM | POA: Diagnosis not present

## 2020-08-03 DIAGNOSIS — S134XXA Sprain of ligaments of cervical spine, initial encounter: Secondary | ICD-10-CM | POA: Diagnosis not present

## 2020-08-03 DIAGNOSIS — S338XXA Sprain of other parts of lumbar spine and pelvis, initial encounter: Secondary | ICD-10-CM | POA: Diagnosis not present

## 2020-08-06 DIAGNOSIS — G5761 Lesion of plantar nerve, right lower limb: Secondary | ICD-10-CM | POA: Diagnosis not present

## 2020-08-06 DIAGNOSIS — M25579 Pain in unspecified ankle and joints of unspecified foot: Secondary | ICD-10-CM | POA: Diagnosis not present

## 2020-08-06 DIAGNOSIS — M79671 Pain in right foot: Secondary | ICD-10-CM | POA: Diagnosis not present

## 2020-08-10 DIAGNOSIS — M79674 Pain in right toe(s): Secondary | ICD-10-CM | POA: Diagnosis not present

## 2020-08-10 DIAGNOSIS — L600 Ingrowing nail: Secondary | ICD-10-CM | POA: Diagnosis not present

## 2020-08-10 DIAGNOSIS — L03031 Cellulitis of right toe: Secondary | ICD-10-CM | POA: Diagnosis not present

## 2020-08-10 DIAGNOSIS — M79671 Pain in right foot: Secondary | ICD-10-CM | POA: Diagnosis not present

## 2020-08-11 ENCOUNTER — Encounter: Payer: Self-pay | Admitting: Emergency Medicine

## 2020-08-11 ENCOUNTER — Ambulatory Visit
Admission: EM | Admit: 2020-08-11 | Discharge: 2020-08-11 | Disposition: A | Discharge status: Home / Self Care | Payer: BC Managed Care – PPO | Attending: Emergency Medicine | Admitting: Emergency Medicine

## 2020-08-11 ENCOUNTER — Other Ambulatory Visit: Payer: Self-pay

## 2020-08-11 DIAGNOSIS — M25532 Pain in left wrist: Secondary | ICD-10-CM

## 2020-08-11 DIAGNOSIS — M778 Other enthesopathies, not elsewhere classified: Secondary | ICD-10-CM

## 2020-08-11 MED ORDER — PREDNISONE 20 MG PO TABS: 20.0000 mg | ORAL_TABLET | Freq: Two times a day (BID) | ORAL | 0 refills | Status: AC

## 2020-08-11 NOTE — ED Provider Notes (Signed)
[facility]   [id number] 08/11/20 Arrival Time: 1621  CC: LT wrist PAIN  SUBJECTIVE: History from: patient. Tonya Love is a 44 y.o. female complains of LT wrist pain x 3 days.  Denies a precipitating event or specific injury.  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  Pain is diffuse about the wrist.  Describes the pain as intermittent and sharp in character.  Has tried OTC medications without relief.  Symptoms are made worse with ROM about the wrist.  Denies similar symptoms in the past.  Denies fever, chills, erythema, ecchymosis, effusion, weakness, numbness and tingling.  ROS: As per HPI.  All other pertinent ROS negative.     Past Medical History:  Diagnosis Date  . Anxiety   . Depression   . GERD (gastroesophageal reflux disease)   . Migraines   . Pancreatitis    Past Surgical History:  Procedure Laterality Date  . BIOPSY  11/06/2019   Procedure: BIOPSY;  Surgeon: Corbin Ade, MD;  [facility]: [facility];  Service: Endoscopy;;  . CHOLECYSTECTOMY    . COLONOSCOPY N/A 11/06/2019   Procedure: COLONOSCOPY;  Surgeon: Corbin Ade, diverticulosis in the sigmoid colon, minimal grade 2 hemorrhoids, otherwise normal.  Segmental biopsies benign.  Repeat colonoscopy in 10 years.  . ESOPHAGOGASTRODUODENOSCOPY N/A 11/06/2019   Procedure: ESOPHAGOGASTRODUODENOSCOPY (EGD);  Surgeon: Corbin Ade, MD; mild Schatzki's ring s/p dilation, abnormal distal esophageal mucosa with benign biopsies, small hiatal hernia, otherwise normal.  . MALONEY DILATION N/A 11/06/2019   Procedure: Elease Hashimoto DILATION;  Surgeon: Corbin Ade, MD;  [facility];  Service: Endoscopy;  Laterality: N/A;  . TUBAL LIGATION     Allergies  Allergen Reactions  . Codeine Nausea Only   No current facility-administered medications on file prior to encounter.   Current Outpatient Medications on File Prior to Encounter  Medication Sig Dispense Refill   . cholestyramine (QUESTRAN) 4 g packet Take 1 packet (4 g total) by mouth daily with breakfast. Mix with at least 4 oz of water. Take other oral medications at least 1 hour before or 4 to 6 hours after cholestyramine 30 each 5  . Fremanezumab-vfrm (AJOVY) 225 MG/1.5ML SOAJ INJECT 225 MG INTO THE SKIN EVERY 30 DAYS 1.5 mL 11  . ibuprofen (ADVIL,MOTRIN) 200 MG tablet Take 600 mg by mouth every 6 (six) hours as needed for headache.    . mirtazapine (REMERON) 15 MG tablet 7.5 mg at night for one week, then 15 mg at night 30 tablet 1  . ondansetron (ZOFRAN) 4 MG tablet Take 1 tablet (4 mg total) by mouth every 8 (eight) hours as needed for nausea or vomiting. 30 tablet 1  . pantoprazole (PROTONIX) 40 MG tablet Take 1 tablet (40 mg total) by mouth 2 (two) times daily. 60 tablet 5  . rizatriptan (MAXALT-MLT) 10 MG disintegrating tablet Take 1 tablet (10 mg total) by mouth as needed for migraine. May repeat in 2 hours if needed, no more than 2 pills in 24 hours. 9 tablet 11   Social History   Socioeconomic History  . Marital status: Legally Separated    Spouse name: Not on file  . Number of children: Not on file  . Years of education: Not on file  . Highest education level: Not on file  Occupational History  . Not on file  Tobacco Use  . Smoking status: Never Smoker  . Smokeless tobacco: Never Used  Vaping Use  .  Vaping Use: Never used  Substance and Sexual Activity  . Alcohol use: No  . Drug use: No  . Sexual activity: Yes    Birth control/protection: Pill  Other Topics Concern  . Not on file  Social History Narrative  . Not on file   Social Determinants of Health   Financial Resource Strain:   . Difficulty of Paying Living Expenses: Not on file  Food Insecurity:   . Worried About Programme researcher, broadcasting/film/video in the Last Year: Not on file  . Ran Out of Food in the Last Year: Not on file  Transportation Needs:   . Lack of Transportation (Medical): Not on file  . Lack of Transportation  (Non-Medical): Not on file  Physical Activity:   . Days of Exercise per Week: Not on file  . Minutes of Exercise per Session: Not on file  Stress:   . Feeling of Stress : Not on file  Social Connections:   . Frequency of Communication with Friends and Family: Not on file  . Frequency of Social Gatherings with Friends and Family: Not on file  . Attends Religious Services: Not on file  . Active Member of Clubs or Organizations: Not on file  . Attends Banker Meetings: Not on file  . Marital Status: Not on file  Intimate Partner Violence:   . Fear of Current or Ex-Partner: Not on file  . Emotionally Abused: Not on file  . Physically Abused: Not on file  . Sexually Abused: Not on file   Family History  Problem Relation Age of Onset  . Depression Maternal Aunt   . Alcohol abuse Father   . Depression Paternal Aunt   . Alcohol abuse Paternal Grandfather   . Colon cancer Neg Hx     OBJECTIVE:  Vitals:   08/11/20 1700 08/11/20 1701  BP:  116/75  Pulse: 71   Resp: 16   Temp: 98.4 F (36.9 C)   TempSrc: Tympanic   SpO2: 99%     General appearance: ALERT; in no acute distress.  Head: NCAT Lungs: Normal respiratory effort CV: Radial pulse 2+ Musculoskeletal: LT wrist Inspection: Skin warm, dry, clear and intact without obvious erythema, effusion, or ecchymosis.  Palpation: diffusely TTP over posterior aspect of wrist, medial and lateral forearm ROM: FROM about the wrist and elbow Strength: 5/5 elbow flexion, 5/5 elbow extension, 5/5 wrist flexion, 5/5 wrist extension Skin: warm and dry Neurologic: Ambulates without difficulty Psychological: alert and cooperative; normal mood and affect  ASSESSMENT & PLAN:  1. Left wrist pain   2. Tendinitis of left wrist     Meds ordered this encounter  Medications  . predniSONE (DELTASONE) 20 MG tablet    Sig: Take 1 tablet (20 mg total) by mouth 2 (two) times daily with a meal for 5 days.    Dispense:  10 tablet     Refill:  0    Order Specific Question:   Supervising Provider    Answer:   Eustace Moore [[id number]]   Continue conservative management of rest, ice, and elevation Ace wrap applied Prednisone prescribed.  Take as directed and to completion Follow up with PCP or orthopedist if symptoms persists or worsen Return or go to the ER if you have any new or worsening symptoms (fever, chills, chest pain, redness, swelling, bruising, deformity, etc...)   Reviewed expectations re: course of current medical issues. Questions answered. Outlined signs and symptoms indicating need for more acute intervention. Patient verbalized understanding. After  Visit Summary given.    Rennis Harding, PA-C 08/11/20 1719

## 2020-08-11 NOTE — Discharge Instructions (Signed)
Continue conservative management of rest, ice, and elevation Ace wrap applied Prednisone prescribed.  Take as directed and to completion Follow up with PCP or orthopedist if symptoms persists or worsen Return or go to the ER if you have any new or worsening symptoms (fever, chills, chest pain, redness, swelling, bruising, deformity, etc...)

## 2020-08-12 DIAGNOSIS — S233XXA Sprain of ligaments of thoracic spine, initial encounter: Secondary | ICD-10-CM | POA: Diagnosis not present

## 2020-08-12 DIAGNOSIS — S338XXA Sprain of other parts of lumbar spine and pelvis, initial encounter: Secondary | ICD-10-CM | POA: Diagnosis not present

## 2020-08-12 DIAGNOSIS — S134XXA Sprain of ligaments of cervical spine, initial encounter: Secondary | ICD-10-CM | POA: Diagnosis not present

## 2020-08-15 ENCOUNTER — Other Ambulatory Visit: Payer: Self-pay

## 2020-08-15 ENCOUNTER — Telehealth: Payer: Self-pay | Admitting: Emergency Medicine

## 2020-08-15 ENCOUNTER — Encounter: Payer: Self-pay | Admitting: Emergency Medicine

## 2020-08-15 ENCOUNTER — Ambulatory Visit
Admission: EM | Admit: 2020-08-15 | Discharge: 2020-08-15 | Disposition: A | Discharge status: Home / Self Care | Payer: BC Managed Care – PPO | Attending: Family Medicine | Admitting: Family Medicine

## 2020-08-15 DIAGNOSIS — R059 Cough, unspecified: Secondary | ICD-10-CM

## 2020-08-15 DIAGNOSIS — R0981 Nasal congestion: Secondary | ICD-10-CM

## 2020-08-15 MED ORDER — BENZONATATE 100 MG PO CAPS: ORAL_CAPSULE | ORAL | 0 refills | Status: DC

## 2020-08-15 MED ORDER — CETIRIZINE HCL 10 MG PO TABS: 10.0000 mg | ORAL_TABLET | Freq: Every day | ORAL | 11 refills | Status: DC

## 2020-08-15 MED ORDER — CETIRIZINE HCL 10 MG PO TABS: 10.0000 mg | ORAL_TABLET | Freq: Every day | ORAL | 0 refills | Status: DC

## 2020-08-15 MED ORDER — IPRATROPIUM BROMIDE 0.03 % NA SOLN: 2.0000 | Freq: Two times a day (BID) | NASAL | Status: DC | PRN

## 2020-08-15 MED ORDER — BENZONATATE 100 MG PO CAPS
100.0000 mg | ORAL_CAPSULE | Freq: Three times a day (TID) | ORAL | 0 refills | Status: DC | PRN
Start: 2020-08-15 — End: 2020-12-01

## 2020-08-15 MED ORDER — IPRATROPIUM BROMIDE 0.03 % NA SOLN: 2.0000 | Freq: Two times a day (BID) | NASAL | 0 refills | Status: DC | PRN

## 2020-08-15 NOTE — ED Triage Notes (Signed)
Headache, sinus drainage and cough x 3 days.

## 2020-08-15 NOTE — ED Provider Notes (Signed)
[facility]    CSN: [id number] Arrival date & time: 08/15/20  1047      History   Chief Complaint Chief Complaint  Patient presents with  . Cough    HPI Tonya Love is a 44 y.o. female.   HPI Patient presents for evaluation of cough, sinus drainage, and headache x 3 days She has not had any known sick contacts. Reports mild wheezing last night. Cough productive of clear mucus. She has not taken any medication for symptoms. Denies history of asthma or bronchitis. She is unvaccinated against COVID and Flu. Past Medical History:  Diagnosis Date  . Anxiety   . Depression   . GERD (gastroesophageal reflux disease)   . Migraines   . Pancreatitis     Patient Active Problem List   Diagnosis Date Noted  . Early satiety 12/18/2019  . Dysphagia 08/27/2019  . Anemia 08/26/2019  . Diarrhea 08/26/2019  . GERD (gastroesophageal reflux disease) 08/26/2019  . Nausea with vomiting 08/26/2019  . History of diverticulitis 08/26/2019  . Rectal bleeding 08/26/2019  . Anxiety state, unspecified 02/11/2014    Past Surgical History:  Procedure Laterality Date  . BIOPSY  11/06/2019   Procedure: BIOPSY;  Surgeon: Corbin Ade, MD;  [facility];  Service: Endoscopy;;  . CHOLECYSTECTOMY    . COLONOSCOPY N/A 11/06/2019   Procedure: COLONOSCOPY;  Surgeon: Corbin Ade, diverticulosis in the sigmoid colon, minimal grade 2 hemorrhoids, otherwise normal.  Segmental biopsies benign.  Repeat colonoscopy in 10 years.  . ESOPHAGOGASTRODUODENOSCOPY N/A 11/06/2019   Procedure: ESOPHAGOGASTRODUODENOSCOPY (EGD);  Surgeon: Corbin Ade, MD; mild Schatzki's ring s/p dilation, abnormal distal esophageal mucosa with benign biopsies, small hiatal hernia, otherwise normal.  . MALONEY DILATION N/A 11/06/2019   Procedure: Elease Hashimoto DILATION;  Surgeon: Corbin Ade, MD;  [facility];  Service: Endoscopy;  Laterality: N/A;  . TUBAL LIGATION      OB History     Gravida      Para      Term      Preterm      AB      Living  2     SAB      TAB      Ectopic      Multiple      Live Births               Home Medications    Prior to Admission medications   Medication Sig Start Date End Date Taking? Authorizing Provider  cholestyramine (QUESTRAN) 4 g packet Take 1 packet (4 g total) by mouth daily with breakfast. Mix with at least 4 oz of water. Take other oral medications at least 1 hour before or 4 to 6 hours after cholestyramine 12/18/19   Harper, Gwendalyn Ege, PA-C  Fremanezumab-vfrm (AJOVY) 225 MG/1.5ML SOAJ INJECT 225 MG INTO THE SKIN EVERY 30 DAYS 05/05/20   [name], NP  ibuprofen (ADVIL,MOTRIN) 200 MG tablet Take 600 mg by mouth every 6 (six) hours as needed for headache.    [provider]  mirtazapine (REMERON) 15 MG tablet 7.5 mg at night for one week, then 15 mg at night 05/14/20   Hisada, Barbee Cough, MD  ondansetron (ZOFRAN) 4 MG tablet Take 1 tablet (4 mg total) by mouth every 8 (eight) hours as needed for nausea or vomiting. 12/18/19   [name], PA-C  pantoprazole (PROTONIX) 40 MG tablet Take 1 tablet (40 mg total) by mouth 2 (two)  times daily. 12/18/19   [name], PA-C  predniSONE (DELTASONE) 20 MG tablet Take 1 tablet (20 mg total) by mouth 2 (two) times daily with a meal for 5 days. 08/11/20 08/16/20  Wurst, Lowanda Foster, PA-C  rizatriptan (MAXALT-MLT) 10 MG disintegrating tablet Take 1 tablet (10 mg total) by mouth as needed for migraine. May repeat in 2 hours if needed, no more than 2 pills in 24 hours. 05/05/20   [name], NP    Family History Family History  Problem Relation Age of Onset  . Depression Maternal Aunt   . Alcohol abuse Father   . Depression Paternal Aunt   . Alcohol abuse Paternal Grandfather   . Colon cancer Neg Hx     Social History Social History   Tobacco Use  . Smoking status: Never Smoker  . Smokeless tobacco: Never Used  Vaping Use  . Vaping Use: Never  used  Substance Use Topics  . Alcohol use: No  . Drug use: No     Allergies   Codeine   Review of Systems Review of Systems Pertinent negatives listed in HPI  Physical Exam Triage Vital Signs ED Triage Vitals  Enc Vitals Group     BP 08/15/20 1121 123/79     Pulse Rate 08/15/20 1121 75     Resp 08/15/20 1121 18     Temp 08/15/20 1121 97.9 F (36.6 C)     Temp Source 08/15/20 1121 Oral     SpO2 08/15/20 1121 96 %     Weight 08/15/20 1129 170 lb (77.1 kg)     Height 08/15/20 1129 5\' 2"  (1.575 m)     Head Circumference --      Peak Flow --      Pain Score 08/15/20 1129 0     Pain Loc --      Pain Edu? --      Excl. in GC? --    No data found.  Updated Vital Signs BP 123/79 (BP Location: Right Arm)   Pulse 75   Temp 97.9 F (36.6 C) (Oral)   Resp 18   Ht 5\' 2"  (1.575 m)   Wt 170 lb (77.1 kg)   SpO2 96%   BMI 31.09 kg/m   Visual Acuity Right Eye Distance:   Left Eye Distance:   Bilateral Distance:    Right Eye Near:   Left Eye Near:    Bilateral Near:     Physical Exam HENT:     Head: Normocephalic.     Right Ear: Tympanic membrane normal.     Left Ear: Tympanic membrane normal.     Nose: Congestion and rhinorrhea present.  Cardiovascular:     Rate and Rhythm: Normal rate and regular rhythm.     Pulses: Normal pulses.  Pulmonary:     Effort: Pulmonary effort is normal.  Musculoskeletal:     Cervical back: Normal range of motion.  Skin:    General: Skin is warm.     Capillary Refill: Capillary refill takes less than 2 seconds.  Neurological:     General: No focal deficit present.     Mental Status: She is alert and oriented to person, place, and time.  Psychiatric:        Mood and Affect: Mood normal.        Behavior: Behavior normal.        Thought Content: Thought content normal.        Judgment: Judgment normal.  UC Treatments / Results  Labs (all labs ordered are listed, but only abnormal results are displayed) Labs Reviewed    COVID-19, FLU A+B AND RSV    EKG   Radiology No results found.  Procedures Procedures (including critical care time)  Medications Ordered in UC Medications - No data to display  Initial Impression / Assessment and Plan / UC Course  I have reviewed the triage vital signs and the nursing notes.  Pertinent labs & imaging results that were available during my care of the patient were reviewed by me and considered in my medical decision making (see chart for details).     Respiratory panel pending.  Start cetirizine 1 hour prior to bedtime.  Atrovent twice daily as needed.  Benzonatate 3 times daily as needed for cough.  Patient advised she will be notified if results are positive only.  If symptoms worsen per breathing difficulty develops go immediately to the closest ER. Final Clinical Impressions(s) / UC Diagnoses   Final diagnoses:  Cough  Sinus congestion   Discharge Instructions   None    ED Prescriptions    Medication Sig Dispense Auth. Provider   cetirizine (ZYRTEC) 10 MG tablet Take 1 tablet (10 mg total) by mouth daily. 30 tablet Bing Neighbors, FNP   benzonatate (TESSALON) 100 MG capsule Take 1-2 capsules (100-200 mg total) by mouth 3 (three) times daily as needed for cough. 40 capsule Bing Neighbors, FNP   ipratropium (ATROVENT) 0.03 % nasal spray Place 2 sprays into both nostrils 2 (two) times daily as needed for rhinitis. 30 mL Bing Neighbors, FNP     PDMP not reviewed this encounter.   Bing Neighbors, FNP 08/15/20 1219

## 2020-08-15 NOTE — Telephone Encounter (Signed)
Pt would like meds sent to [facility] in [place] d/t [facility] is closed

## 2020-08-16 LAB — COVID-19, FLU A+B AND RSV
Influenza A, NAA: NOT DETECTED
Influenza B, NAA: NOT DETECTED
RSV, NAA: NOT DETECTED
SARS-CoV-2, NAA: NOT DETECTED

## 2020-08-17 ENCOUNTER — Telehealth: Payer: Self-pay

## 2020-08-17 NOTE — Telephone Encounter (Signed)
Received a fax requesting a refill on this medication.  Pt last seen 07-06-20 next appt  08-24-20  mirtazapine (REMERON) 15 MG tablet Medication Date: 05/14/2020 Department: [facility] Ordering/Authorizing: [name], MD  Order Providers  Prescribing Provider Encounter Provider  [name], MD [name], MD  Outpatient Medication Detail   Disp Refills Start End   mirtazapine (REMERON) 15 MG tablet 30 tablet 1 05/14/2020    Sig: 7.5 mg at night for one week, then 15 mg at night   Sent to pharmacy as: mirtazapine (REMERON) 15 MG tablet   E-Prescribing Status: Receipt confirmed by pharmacy (05/14/2020 11:13 AM EDT)   Pharmacy  [facility] [phone] - [place], [place] - [address] AT [facility] [address]
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
## 2020-08-17 NOTE — Telephone Encounter (Signed)
Decline, as she should have enough meds (two months supply)

## 2020-08-24 ENCOUNTER — Telehealth (HOSPITAL_COMMUNITY): Payer: BC Managed Care – PPO | Admitting: Psychiatry

## 2020-08-24 ENCOUNTER — Telehealth (INDEPENDENT_AMBULATORY_CARE_PROVIDER_SITE_OTHER): Payer: BC Managed Care – PPO | Admitting: Psychiatry

## 2020-08-24 ENCOUNTER — Encounter: Payer: Self-pay | Admitting: Psychiatry

## 2020-08-24 ENCOUNTER — Other Ambulatory Visit: Payer: Self-pay

## 2020-08-24 DIAGNOSIS — L600 Ingrowing nail: Secondary | ICD-10-CM | POA: Diagnosis not present

## 2020-08-24 DIAGNOSIS — F330 Major depressive disorder, recurrent, mild: Secondary | ICD-10-CM | POA: Diagnosis not present

## 2020-08-24 DIAGNOSIS — M79674 Pain in right toe(s): Secondary | ICD-10-CM | POA: Diagnosis not present

## 2020-08-24 DIAGNOSIS — M79671 Pain in right foot: Secondary | ICD-10-CM | POA: Diagnosis not present

## 2020-08-24 DIAGNOSIS — L03031 Cellulitis of right toe: Secondary | ICD-10-CM | POA: Diagnosis not present

## 2020-08-24 MED ORDER — MIRTAZAPINE 30 MG PO TABS
30.0000 mg | ORAL_TABLET | Freq: Every day | ORAL | 1 refills | Status: DC
Start: 2020-08-24 — End: 2020-10-05

## 2020-08-27 ENCOUNTER — Other Ambulatory Visit: Payer: Self-pay

## 2020-08-27 ENCOUNTER — Ambulatory Visit (INDEPENDENT_AMBULATORY_CARE_PROVIDER_SITE_OTHER): Payer: BC Managed Care – PPO | Admitting: Clinical

## 2020-08-27 DIAGNOSIS — F419 Anxiety disorder, unspecified: Secondary | ICD-10-CM

## 2020-08-27 DIAGNOSIS — F331 Major depressive disorder, recurrent, moderate: Secondary | ICD-10-CM
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
## 2020-08-29 NOTE — Progress Notes (Signed)
Referring Provider: Kirstie Peri, MD Primary Care Physician:  Kirstie Peri, MD Primary GI Physician: Dr. Jena Gauss  Chief Complaint  Patient presents with   Gastroesophageal Reflux    Doing better with med    HPI:   OLINDA NOLA is a 44 y.o. female with a history of GERD, dysphagia, early satiety, nausea with vomiting, diverticulitis in 2016 and 2020, postprandial diarrhea with associated abdominal cramping, toilet tissue hematochezia in the setting of hemorrhoids, mild anemia with iron panel on the low end of normal suspected to be influenced by menorrhagia. TCS and EGD February 2021 with diverticulosis in sigmoid colon, minimal grade 2 hemorrhoids, and benign random colon biopsies with recommendations to repeat in 10 years.  EGD with mild Schatzki ring s/p dilation, abnormal distal esophageal mucosa with benign biopsy, small hiatal hernia, otherwise normal exam.  C. difficile and GI pathogen panel negative in December 2020.GES wnl in April 2021.  Celiac serologies negative. Thyroid function low end of normal.   She is presenting today for follow-up of GERD, nausea with intermittent vomiting, early satiety, weight loss, and diarrhea.   Last seen in July 2021. Continued with chronic postprandial diarrhea with associated lower abdominal cramping. Bentyl had worked well previously, but patient stated Dr. Luevenia Maxin recommended discontinuing this medication at the time of her prior TCS. Not clear if this was Dr. Jeanella Flattery recommendations. GERD well controlled on PPI BID. Intensity of morning nausea decreased with limiting eating within 3 hours of going to bed. Continued eating cookies or ice cream before bed at times. Vomiting had resolved with discontinuing milk. Documented 13 lb unintentional weight loss over the last 4 months.Queries nocturnal GERD symptoms/lactose intolerance contributing to morning nausea.  Etiology of early satiety and weight loss is not clear.  Plan included CT A/P, Zofran, stop  omeprazole and start Protonix BID, lactose free/low fat diet, no eating within 3 hours of laying down, prop head of bed up, Questran 4 mg daily.   CT 05/08/20: No significant findings to explain weight loss, early satiety, or nausea.  Received labs from PCP completed in March 2021. CBC, CMP normal. TSH low at 0.328. Labs in June 2021, TSH 0.483. Suspected possible subclinical hyperthyroidism contributing to weight loss. Advised she follow-up with PCP.   She has been following with behavioral health.  Mirtazapine recently increased to 30 mg nightly in hopes to target ongoing depression, appetite loss, and insomnia.  Today:  Weight: Gained 14 lbs back over the last 5 months.   Nausea/vomiting much improved. No vomiting. Nausea is hardly ever. Continues to avoid milk. The occasional nausea is associated with greasy or dairy products. GERD is well controlled on Protonix 40 mg BID.  No dysphagia.  No abdominal pain.   Diarrhea/abdominal pain: Improved. Diarrhea is intermittent. Maybe once a week or less. Had [place] food last night and had diarrhea. Lower abdominal cramping with diarrhea. Satisfied her her current bowel regimen.  Does not feel she needs increased or changing current medication regimen.  Typically, bowel movements are soft and formed.  No constipation. No blood in the stool or black stool.   Ibuprofen fairly rare.  Headaches are improved with Ajovy.  Past Medical History:  Diagnosis Date   Anxiety    Depression    GERD (gastroesophageal reflux disease)    Migraines    Pancreatitis     Past Surgical History:  Procedure Laterality Date   BIOPSY  11/06/2019   Procedure: BIOPSY;  Surgeon: Corbin Ade, MD;  Location:  [facility];  Service: Endoscopy;;   CHOLECYSTECTOMY     COLONOSCOPY N/A 11/06/2019   Procedure: COLONOSCOPY;  Surgeon: Corbin Ade, diverticulosis in the sigmoid colon, minimal grade 2 hemorrhoids, otherwise normal.  Segmental biopsies benign.   Repeat colonoscopy in 10 years.   ESOPHAGOGASTRODUODENOSCOPY N/A 11/06/2019   Procedure: ESOPHAGOGASTRODUODENOSCOPY (EGD);  Surgeon: Corbin Ade, MD; mild Schatzki's ring s/p dilation, abnormal distal esophageal mucosa with benign biopsies, small hiatal hernia, otherwise normal.   MALONEY DILATION N/A 11/06/2019   Procedure: Elease Hashimoto DILATION;  Surgeon: Corbin Ade, MD;  Location: [facility];  Service: Endoscopy;  Laterality: N/A;   TUBAL LIGATION      Current Outpatient Medications  Medication Sig Dispense Refill   benzonatate (TESSALON) 100 MG capsule Take 1-2 capsules (100-200 mg total) by mouth 3 (three) times daily as needed for cough. 40 capsule 0   benzonatate (TESSALON) 100 MG capsule Take 1-2 tablets 3 times daily as needed for cough 40 capsule 0   cetirizine (ZYRTEC ALLERGY) 10 MG tablet Take 1 tablet (10 mg total) by mouth daily. 30 tablet 0   cholestyramine (QUESTRAN) 4 g packet Take 1 packet (4 g total) by mouth daily with breakfast. Mix with at least 4 oz of water. Take other oral medications at least 1 hour before or 4 to 6 hours after cholestyramine 30 each 5   Fremanezumab-vfrm (AJOVY) 225 MG/1.5ML SOAJ INJECT 225 MG INTO THE SKIN EVERY 30 DAYS 1.5 mL 11   ibuprofen (ADVIL,MOTRIN) 200 MG tablet Take 600 mg by mouth every 6 (six) hours as needed for headache.     ipratropium (ATROVENT) 0.03 % nasal spray Place 2 sprays into both nostrils 2 (two) times daily as needed for rhinitis. 30 mL 0   mirtazapine (REMERON) 30 MG tablet Take 1 tablet (30 mg total) by mouth at bedtime. 30 tablet 1   pantoprazole (PROTONIX) 40 MG tablet Take 1 tablet (40 mg total) by mouth 2 (two) times daily. 60 tablet 5   rizatriptan (MAXALT-MLT) 10 MG disintegrating tablet Take 1 tablet (10 mg total) by mouth as needed for migraine. May repeat in 2 hours if needed, no more than 2 pills in 24 hours. 9 tablet 11   ondansetron (ZOFRAN) 4 MG tablet Take 1 tablet (4 mg total) by mouth every  8 (eight) hours as needed for nausea or vomiting. (Patient not taking: Reported on 08/31/2020) 30 tablet 1   Current Facility-Administered Medications  Medication Dose Route Frequency Provider Last Rate Last Admin   ipratropium (ATROVENT) 0.03 % nasal spray 2 spray  2 spray Each Nare BID PRN Bing Neighbors, FNP        Allergies as of 08/31/2020 - Review Complete 08/24/2020  Allergen Reaction Noted   Codeine Nausea Only 12/25/2014    Family History  Problem Relation Age of Onset   Depression Maternal Aunt    Alcohol abuse Father    Depression Paternal Aunt    Alcohol abuse Paternal Grandfather    Colon cancer Neg Hx     Social History   Socioeconomic History   Marital status: Legally Separated    Spouse name: Not on file   Number of children: Not on file   Years of education: Not on file   Highest education level: Not on file  Occupational History   Not on file  Tobacco Use   Smoking status: Never Smoker   Smokeless tobacco: Never Used  Vaping Use   Vaping Use: Never used  Substance and Sexual Activity   Alcohol use: No   Drug use: No   Sexual activity: Yes    Birth control/protection: Pill  Other Topics Concern   Not on file  Social History Narrative   Not on file   Social Determinants of Health   Financial Resource Strain: Not on file  Food Insecurity: Not on file  Transportation Needs: Not on file  Physical Activity: Not on file  Stress: Not on file  Social Connections: Not on file    Review of Systems: Gen: Denies fever or chills. Has a lot of sinus drainage. No lightheadedness, dizziness, pre-syncope.  CV: Denies chest pain.  Admits to occasional palpitations.  Resp: Denies dyspnea at rest. Has dyspnea with exertion. Intermittent cough related to current congestion.  GI: See HPI.  Heme: See HPI.  Physical Exam: BP 106/71    Pulse 73    Temp (!) 97.3 F (36.3 C) (Temporal)    Ht 5\' 2"  (1.575 m)    Wt 175 lb (79.4 kg)    LMP  08/24/2020 (Approximate)    BMI 32.01 kg/m  General:   Alert and oriented. No distress noted. Pleasant and cooperative.  Head:  Normocephalic and atraumatic. Eyes:  Conjuctiva clear without scleral icterus. Heart:  S1, S2 present without murmurs appreciated. Lungs:  Clear to auscultation bilaterally. No wheezes, rales, or rhonchi. No distress.  Abdomen:  +BS, soft, non-tender and non-distended. No rebound or guarding. No HSM or masses noted. Msk:  Symmetrical without gross deformities. Normal posture. Extremities:  Without edema. Neurologic:  Alert and  oriented x4 Psych: Normal mood and affect.

## 2020-08-31 ENCOUNTER — Other Ambulatory Visit: Payer: Self-pay

## 2020-08-31 ENCOUNTER — Ambulatory Visit (INDEPENDENT_AMBULATORY_CARE_PROVIDER_SITE_OTHER): Payer: BC Managed Care – PPO | Admitting: Gastroenterology

## 2020-08-31 ENCOUNTER — Encounter: Payer: Self-pay | Admitting: Gastroenterology

## 2020-08-31 VITALS — BP 106/71 | HR 73 | Temp 97.3°F | Ht 62.0 in | Wt 175.0 lb

## 2020-08-31 DIAGNOSIS — K219 Gastro-esophageal reflux disease without esophagitis: Secondary | ICD-10-CM

## 2020-08-31 DIAGNOSIS — R112 Nausea with vomiting, unspecified: Secondary | ICD-10-CM | POA: Diagnosis not present

## 2020-08-31 DIAGNOSIS — R197 Diarrhea, unspecified: Secondary | ICD-10-CM | POA: Diagnosis not present

## 2020-08-31 DIAGNOSIS — R6881 Early satiety: Secondary | ICD-10-CM | POA: Diagnosis not present

## 2020-08-31 NOTE — Patient Instructions (Addendum)
Continue taking Protonix 40 mg twice daily 30 minutes before breakfast and dinner.  Continue taking Questran 4 g daily with breakfast.  Continue to avoid dairy products.  Continue to avoid fried, fatty, greasy foods.  We will plan to see back in 6 months.  Do not hesitate to call if you have any questions or concerns prior.  Was good to see you today!  Hope you have a great Christmas!  Ermalinda Memos, PA-C [facility]

## 2020-08-31 NOTE — Assessment & Plan Note (Addendum)
Patient had been experiencing intermittent morning nausea, intermittent vomiting, and early satiety for greater than 1 year.  She also had documented 13 weight loss between March-July 2021.  EGD in February 2021 with mild Schatzki's ring s/p dilation, abnormal distal esophageal mucosa with benign biopsy, small hiatal hernia, otherwise normal exam.  Celiac serologies negative.  GES within normal limits in April 2021. History of cholecystectomy. CT A/P in August 2021 with no significant findings to explain weight loss, early satiety, or nausea.  Symptoms have essentially resolved with changing PPI from omeprazole to Protonix 40 mg twice daily and avoiding dairy products. She has also gained about 14 pounds over the last 5 months. Symptoms are likely secondary to GERD along with lactose intolerance.  May have also had psychosocial component as well.  She is following with behavioral health and mirtazapine was increased to 30 mg nightly in December 2021.  Advise she continue Protonix 40 mg twice daily, continue to avoid dairy products and fried, fatty, greasy foods.  Follow-up in 6 months.

## 2020-08-31 NOTE — Assessment & Plan Note (Signed)
Addressed under nausea with vomiting 

## 2020-08-31 NOTE — Assessment & Plan Note (Addendum)
Chronic history of postprandial diarrhea with intermittent lower abdominal cramping prior to BMs that resolves thereafter.  History of cholecystectomy.  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  Previously tried on Bentyl 10 mg which patient responded well to, but patient reported Dr. Jena Gauss advise she discontinue Bentyl; it is unclear if this was truly Dr. Luvenia Starch recommendations.  She was started on Questran 4 mg daily in July 2021 and symptoms are much improved.  Diarrhea and lower abdominal cramping maybe once a week and seems to be triggered by fattier meals.  She is satisfied with her current bowel regimen.  No alarm symptoms.  Colonoscopy up-to-date in February 2021.  Random colon biopsies were benign.  She is due for repeat colonoscopy in 2031.  Plan: Continue Questran 4 g daily with breakfast. Continue to avoid fried, fatty, greasy foods. Continue to avoid dairy products. Follow-up in 6 months.

## 2020-08-31 NOTE — Assessment & Plan Note (Signed)
Well-controlled on Protonix 40 mg twice daily.  Advise she continue her current medications and follow-up in 6 months.

## 2020-09-17 DIAGNOSIS — S338XXA Sprain of other parts of lumbar spine and pelvis, initial encounter: Secondary | ICD-10-CM | POA: Diagnosis not present

## 2020-09-17 DIAGNOSIS — S134XXA Sprain of ligaments of cervical spine, initial encounter: Secondary | ICD-10-CM | POA: Diagnosis not present

## 2020-09-17 DIAGNOSIS — S233XXA Sprain of ligaments of thoracic spine, initial encounter: Secondary | ICD-10-CM | POA: Diagnosis not present

## 2020-09-23 ENCOUNTER — Other Ambulatory Visit: Payer: Self-pay

## 2020-09-23 ENCOUNTER — Ambulatory Visit (INDEPENDENT_AMBULATORY_CARE_PROVIDER_SITE_OTHER): Payer: BC Managed Care – PPO | Admitting: Clinical

## 2020-09-23 DIAGNOSIS — F331 Major depressive disorder, recurrent, moderate: Secondary | ICD-10-CM | POA: Diagnosis not present

## 2020-09-23 DIAGNOSIS — F419 Anxiety disorder, unspecified: Secondary | ICD-10-CM | POA: Diagnosis not present

## 2020-09-23 NOTE — Progress Notes (Signed)
Virtual Visit via Video Note  I connected with ANALYSSE QUINONEZ on 09/23/20 at  4:00 PM EST by a video enabled telemedicine application and verified that I am speaking with the correct person using two identifiers.  Location: Patient: Home Provider: Office   I discussed the limitations of evaluation and management by telemedicine and the availability of in person appointments. The patient expressed understanding and agreed to proceed.  Therapy Progress Note   Session Time:4:00PM-4:25PM  Participation Level:Active  Behavioral Response:CasualAlertDepressed  Type of Therapy:Individual Therapy  Treatment Goals addressed:Coping  Interventions:CBT, Motivational Interviewing, Strength-based and Supportive  Summary:Elyza E. Spillmanis [id number].[id number].femalewho presents withDepression and Anxiety.The OPT therapist worked with thepatientfor herongoingOPT treatment. The OPT therapist utilized Motivational Interviewing to assist in creating therapeutic repore. The patient in the session was engaged and work in Tour manager about hertriggers and symptoms over the past few weeksincluding being dumped out of blue by her boyfriend who she had been seeing for the past 6 months.The OPT therapist worked with the patient to gauge her mood over the course of the past week and since the patients breakup as well as how things were for the patient over the course of the Christmas and New Years holiday. The OPT therapist utilized anticipatory guidance looking forward for potential triggering events.The patient noted the only upcoming transition she is aware of is her son coming to visit from January 17th-31st.  Suicidal/Homicidal:Nowithout intent/plan  Therapist Response:The OPT therapist worked with the patient for the patients scheduled session. The patient was engaged in hersession and gave feedback in relation to triggers, symptoms, and behavior responses over the  pastfewweeks. The OPT therapist worked with the patient utilizing an in session Cognitive Behavioral Therapy exercise. The patient was responsive in the session and verbalized, "Igot dumped out of the blue right before Christmas, I ws shocked it didn't see that coming".The OPT therapist inquired and monitored the patients mood over the past few weeks since the breakup; the patient indicated she was doing well and due to not being with her ex-boyfriend that long she had not put a lot of emotional stock into the relationship..The OPT therapist provided support, encouragement and worked with the patient on goals for 2022.The OPT therapist will continue treatment work with the patient in hernext scheduled session.  Plan: Return again2/3 weeks  Diagnosis:Axis I:Recurrent, moderate major depressive disorder with Anxiety Axis II:No diagnosis  I discussed the assessment and treatment plan with the patient. The patient was provided an opportunity to ask questions and all were answered. The patient agreed with the plan and demonstrated an understanding of the instructions.  The patient was advised to call back or seek an in-person evaluation if the symptoms worsen or if the condition fails to improve as anticipated.  I provided3minutes of non-face-to-face time during this encounter.   Winfred Burn, LCSW 09/23/2020

## 2020-09-29 ENCOUNTER — Other Ambulatory Visit: Payer: Self-pay | Admitting: Family Medicine
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
## 2020-10-05 ENCOUNTER — Other Ambulatory Visit: Payer: Self-pay

## 2020-10-05 ENCOUNTER — Encounter: Payer: Self-pay | Admitting: Psychiatry

## 2020-10-05 ENCOUNTER — Telehealth (INDEPENDENT_AMBULATORY_CARE_PROVIDER_SITE_OTHER): Payer: BC Managed Care – PPO | Admitting: Psychiatry

## 2020-10-05 DIAGNOSIS — F3341 Major depressive disorder, recurrent, in partial remission: Secondary | ICD-10-CM | POA: Diagnosis not present

## 2020-10-05 MED ORDER — MIRTAZAPINE 30 MG PO TABS
30.0000 mg | ORAL_TABLET | Freq: Every day | ORAL | 2 refills | Status: DC
Start: 2020-10-25 — End: 2024-03-11

## 2020-10-05 NOTE — Patient Instructions (Signed)
1. Continue mirtazapine 30 mg a night   2 Next appointment: 4/11 at 1:40

## 2020-10-14 DIAGNOSIS — M79671 Pain in right foot: Secondary | ICD-10-CM | POA: Diagnosis not present

## 2020-10-14 DIAGNOSIS — M79674 Pain in right toe(s): Secondary | ICD-10-CM | POA: Diagnosis not present

## 2020-10-14 DIAGNOSIS — L03031 Cellulitis of right toe: Secondary | ICD-10-CM | POA: Diagnosis not present

## 2020-10-19 ENCOUNTER — Other Ambulatory Visit: Payer: Self-pay

## 2020-10-19 ENCOUNTER — Ambulatory Visit (HOSPITAL_COMMUNITY): Payer: BC Managed Care – PPO | Admitting: Clinical

## 2020-11-16 DIAGNOSIS — L03032 Cellulitis of left toe: Secondary | ICD-10-CM | POA: Diagnosis not present

## 2020-11-16 DIAGNOSIS — L600 Ingrowing nail: Secondary | ICD-10-CM | POA: Diagnosis not present

## 2020-11-16 DIAGNOSIS — M79672 Pain in left foot: Secondary | ICD-10-CM | POA: Diagnosis not present

## 2020-11-16 DIAGNOSIS — M79675 Pain in left toe(s): Secondary | ICD-10-CM | POA: Diagnosis not present

## 2020-11-30 DIAGNOSIS — M79675 Pain in left toe(s): Secondary | ICD-10-CM | POA: Diagnosis not present

## 2020-11-30 DIAGNOSIS — M79672 Pain in left foot: Secondary | ICD-10-CM | POA: Diagnosis not present

## 2020-11-30 DIAGNOSIS — L600 Ingrowing nail: Secondary | ICD-10-CM | POA: Diagnosis not present

## 2020-12-01 ENCOUNTER — Encounter: Payer: Self-pay | Admitting: Emergency Medicine

## 2020-12-01 ENCOUNTER — Ambulatory Visit
Admission: EM | Admit: 2020-12-01 | Discharge: 2020-12-01 | Disposition: A | Discharge status: Home / Self Care | Payer: BC Managed Care – PPO | Attending: Emergency Medicine | Admitting: Emergency Medicine

## 2020-12-01 ENCOUNTER — Other Ambulatory Visit: Payer: Self-pay

## 2020-12-01 DIAGNOSIS — R0981 Nasal congestion: Secondary | ICD-10-CM | POA: Diagnosis not present

## 2020-12-01 MED ORDER — BENZONATATE 100 MG PO CAPS: 100.0000 mg | ORAL_CAPSULE | Freq: Three times a day (TID) | ORAL | 0 refills | Status: DC

## 2020-12-01 MED ORDER — PREDNISONE 20 MG PO TABS: 20.0000 mg | ORAL_TABLET | Freq: Two times a day (BID) | ORAL | 0 refills | Status: AC

## 2020-12-01 NOTE — Discharge Instructions (Signed)
COVID testing ordered.  It will take between 5-7 days for test results.  Someone will contact you regarding abnormal results.    In the meantime: You should remain isolated in your home for 10 days from symptom onset AND greater than 72 hours after symptoms resolution (absence of fever without the use of fever-reducing medication and improvement in respiratory symptoms), whichever is longer Get plenty of rest and push fluids Prednisone for congestion Tessalon Perles prescribed for cough Use OTC zyrtec for nasal congestion, runny nose, and/or sore throat Use OTC flonase for nasal congestion and runny nose Use medications daily for symptom relief Use OTC medications like ibuprofen or tylenol as needed fever or pain Call or go to the ED if you have any new or worsening symptoms such as fever, worsening cough, shortness of breath, chest tightness, chest pain, turning blue, changes in mental status, etc..Marland Kitchen

## 2020-12-01 NOTE — ED Provider Notes (Signed)
[facility]   [id number] 12/01/20 Arrival Time: 1436   CC: COVID symptoms  SUBJECTIVE: History from: patient.  [place] is a 45 y.o. female who presents with runny nose, congestion, sinus pain, drainage, and cough x 1-2 days.  Grand baby with similar symptoms.  Denies alleviating or aggravating factors.  Reports previous symptoms in the past.   Denies fever, chills, SOB, wheezing, chest pain, nausea, changes in bowel or bladder habits.    ROS: As per HPI.  All other pertinent ROS negative.     Past Medical History:  Diagnosis Date  . Anxiety   . Depression   . GERD (gastroesophageal reflux disease)   . Migraines   . Pancreatitis    Past Surgical History:  Procedure Laterality Date  . BIOPSY  11/06/2019   Procedure: BIOPSY;  Surgeon: Corbin Ade, MD;  [facility];  Service: Endoscopy;;  . CHOLECYSTECTOMY    . COLONOSCOPY N/A 11/06/2019   Procedure: COLONOSCOPY;  Surgeon: Corbin Ade, diverticulosis in the sigmoid colon, minimal grade 2 hemorrhoids, otherwise normal.  Segmental biopsies benign.  Repeat colonoscopy in 10 years.  . ESOPHAGOGASTRODUODENOSCOPY N/A 11/06/2019   Procedure: ESOPHAGOGASTRODUODENOSCOPY (EGD);  Surgeon: Corbin Ade, MD; mild Schatzki's ring s/p dilation, abnormal distal esophageal mucosa with benign biopsies, small hiatal hernia, otherwise normal.  . MALONEY DILATION N/A 11/06/2019   Procedure: Elease Hashimoto DILATION;  Surgeon: Corbin Ade, MD;  [facility];  Service: Endoscopy;  Laterality: N/A;  . TUBAL LIGATION     Allergies  Allergen Reactions  . Codeine Nausea Only   Current Facility-Administered Medications on File Prior to Encounter  Medication Dose Route Frequency Provider Last Rate Last Admin  . ipratropium (ATROVENT) 0.03 % nasal spray 2 spray  2 spray Each Nare BID PRN Bing Neighbors, FNP       Current Outpatient Medications on File Prior to Encounter  Medication Sig Dispense Refill  .  cetirizine (ZYRTEC ALLERGY) 10 MG tablet Take 1 tablet (10 mg total) by mouth daily. 30 tablet 0  . cholestyramine (QUESTRAN) 4 g packet Take 1 packet (4 g total) by mouth daily with breakfast. Mix with at least 4 oz of water. Take other oral medications at least 1 hour before or 4 to 6 hours after cholestyramine 30 each 5  . Fremanezumab-vfrm (AJOVY) 225 MG/1.5ML SOAJ INJECT 225 MG INTO THE SKIN EVERY 30 DAYS 1.5 mL 11  . ibuprofen (ADVIL,MOTRIN) 200 MG tablet Take 600 mg by mouth every 6 (six) hours as needed for headache.    . ipratropium (ATROVENT) 0.03 % nasal spray Place 2 sprays into both nostrils 2 (two) times daily as needed for rhinitis. 30 mL 0  . ondansetron (ZOFRAN) 4 MG tablet Take 1 tablet (4 mg total) by mouth every 8 (eight) hours as needed for nausea or vomiting. (Patient not taking: Reported on 08/31/2020) 30 tablet 1  . pantoprazole (PROTONIX) 40 MG tablet Take 1 tablet (40 mg total) by mouth 2 (two) times daily. 60 tablet 5  . rizatriptan (MAXALT-MLT) 10 MG disintegrating tablet Take 1 tablet (10 mg total) by mouth as needed for migraine. May repeat in 2 hours if needed, no more than 2 pills in 24 hours. 9 tablet 11  . [DISCONTINUED] mirtazapine (REMERON) 30 MG tablet Take 1 tablet (30 mg total) by mouth at bedtime. 30 tablet 2   Social History   Socioeconomic History  . Marital status: Legally Separated    Spouse name:  Not on file  . Number of children: Not on file  . Years of education: Not on file  . Highest education level: Not on file  Occupational History  . Not on file  Tobacco Use  . Smoking status: Never Smoker  . Smokeless tobacco: Never Used  Vaping Use  . Vaping Use: Never used  Substance and Sexual Activity  . Alcohol use: No  . Drug use: No  . Sexual activity: Yes    Birth control/protection: Pill  Other Topics Concern  . Not on file  Social History Narrative  . Not on file   Social Determinants of Health   Financial Resource Strain: Not on file   Food Insecurity: Not on file  Transportation Needs: Not on file  Physical Activity: Not on file  Stress: Not on file  Social Connections: Not on file  Intimate Partner Violence: Not on file   Family History  Problem Relation Age of Onset  . Depression Maternal Aunt   . Alcohol abuse Father   . Depression Paternal Aunt   . Alcohol abuse Paternal Grandfather   . Colon cancer Neg Hx     OBJECTIVE:  Vitals:   12/01/20 1440  BP: 110/71  Pulse: (!) 103  Resp: 18  Temp: 98.3 F (36.8 C)  TempSrc: Oral  SpO2: 96%     General appearance: alert; appears fatigued, but nontoxic; speaking in full sentences and tolerating own secretions HEENT: NCAT; Ears: EACs clear, TMs pearly gray; Eyes: PERRL.  EOM grossly intact. Sinuses: nontender; Nose: nares patent without rhinorrhea, turbinates swollen and erythematous, Throat: oropharynx clear, tonsils non erythematous or enlarged, uvula midline  Neck: supple without LAD Lungs: unlabored respirations, symmetrical air entry; cough: absent; no respiratory distress; CTAB Heart: regular rate and rhythm.  Radial pulses 2+ symmetrical bilaterally Skin: warm and dry Psychological: alert and cooperative; normal mood and affect  ASSESSMENT & PLAN:  1. Sinus congestion     Meds ordered this encounter  Medications  . benzonatate (TESSALON) 100 MG capsule    Sig: Take 1 capsule (100 mg total) by mouth every 8 (eight) hours.    Dispense:  21 capsule    Refill:  0    Order Specific Question:   Supervising Provider    Answer:   Eustace Moore [[id number]]  . predniSONE (DELTASONE) 20 MG tablet    Sig: Take 1 tablet (20 mg total) by mouth 2 (two) times daily with a meal for 5 days.    Dispense:  10 tablet    Refill:  0    Order Specific Question:   Supervising Provider    Answer:   Eustace Moore [[id number]]    COVID testing ordered.  It will take between 5-7 days for test results.  Someone will contact you regarding abnormal results.     In the meantime: You should remain isolated in your home for 10 days from symptom onset AND greater than 72 hours after symptoms resolution (absence of fever without the use of fever-reducing medication and improvement in respiratory symptoms), whichever is longer Get plenty of rest and push fluids Prednisone for congestion Tessalon Perles prescribed for cough Use OTC zyrtec for nasal congestion, runny nose, and/or sore throat Use OTC flonase for nasal congestion and runny nose Use medications daily for symptom relief Use OTC medications like ibuprofen or tylenol as needed fever or pain Call or go to the ED if you have any new or worsening symptoms such as fever, worsening cough, shortness  of breath, chest tightness, chest pain, turning blue, changes in mental status, etc...   Reviewed expectations re: course of current medical issues. Questions answered. Outlined signs and symptoms indicating need for more acute intervention. Patient verbalized understanding. After Visit Summary given.         Rennis Harding, PA-C 12/01/20 1452

## 2020-12-01 NOTE — ED Triage Notes (Signed)
Sneezing, runny nose and headache.  States nasal congestion is green.  Symptoms started yesterday.

## 2020-12-02 LAB — SARS-COV-2, NAA 2 DAY TAT

## 2020-12-02 LAB — NOVEL CORONAVIRUS, NAA: SARS-CoV-2, NAA: NOT DETECTED

## 2020-12-03 ENCOUNTER — Telehealth: Payer: Self-pay

## 2020-12-03 NOTE — Telephone Encounter (Signed)
PA for Ajov was submitted via cmm, denial was received. Denial was made due to pt not trying/failing Aimovig/Emgality and due to not trying oral preventives. I called pt to discuss. She sts she is still getting her Ajovy through discount card but has recently increased from 5 $ to 50 $. Pt sts she is going back next month for next refill and if price is still 50 $ she will call back. Pt was advised would could discuss other injectables to see if co-payment would be less. Pt was agreeable.

## 2020-12-08 ENCOUNTER — Ambulatory Visit
Admission: EM | Admit: 2020-12-08 | Discharge: 2020-12-08 | Disposition: A | Discharge status: Home / Self Care | Payer: BC Managed Care – PPO

## 2020-12-11 DIAGNOSIS — R059 Cough, unspecified: Secondary | ICD-10-CM | POA: Diagnosis not present

## 2020-12-11 DIAGNOSIS — R0981 Nasal congestion: Secondary | ICD-10-CM | POA: Diagnosis not present

## 2020-12-11 DIAGNOSIS — J40 Bronchitis, not specified as acute or chronic: Secondary | ICD-10-CM | POA: Diagnosis not present

## 2020-12-11 IMAGING — CT CT ABD-PELV W/ CM
2 of 5 series · 17 of 46 positions shown, 19 images · IV contrast (Omnipaque or Isovue)
Comparison: 07/19/2019 from [HOSPITAL] [facility]

CLINICAL DATA: Unintentional weight loss. Nausea and vomiting.
Early satiety.

EXAM:
CT ABDOMEN AND PELVIS WITH CONTRAST
TECHNIQUE: Multidetector CT imaging of the abdomen and pelvis was performed
using the standard protocol following bolus administration of
intravenous contrast.
CONTRAST:  100mL OMNIPAQUE IOHEXOL 300 MG/ML  SOLN

[Series 2: axial st · axial · 0.86mm/px · z∈[+632,+1082]mm · 14 of 100 slices shown, 16 images]
[im 5/100  soft-tissue]
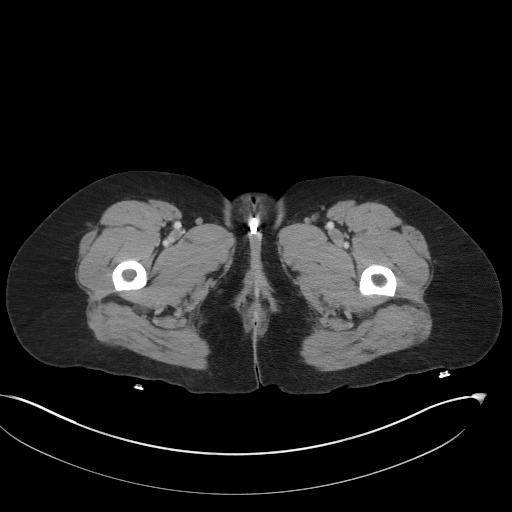
[im 5/100  bone]
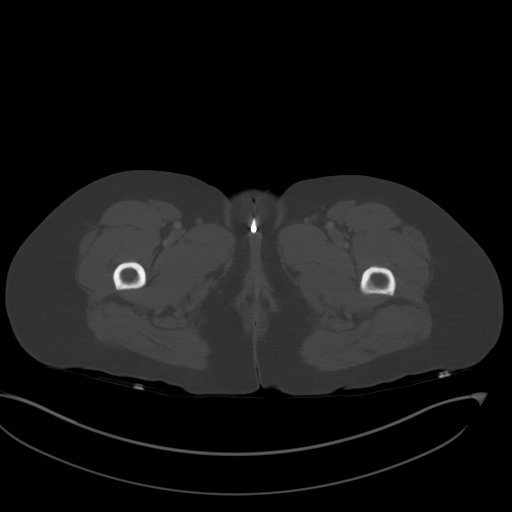
[im 15/100  soft-tissue]
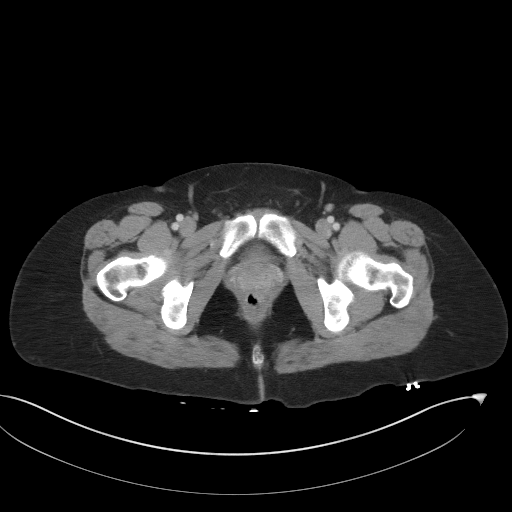
[im 20/100  soft-tissue]
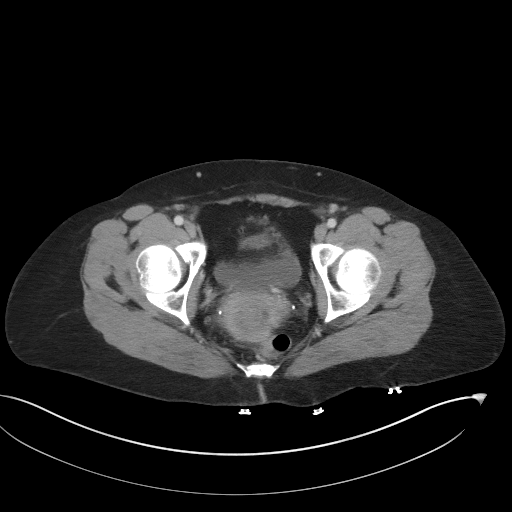
[im 25/100  soft-tissue]
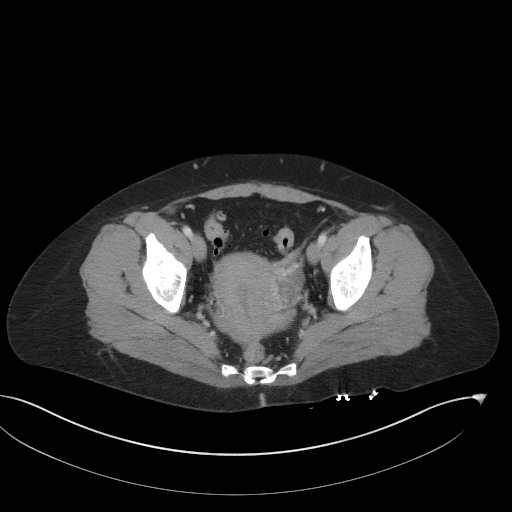
[im 35/100  soft-tissue]
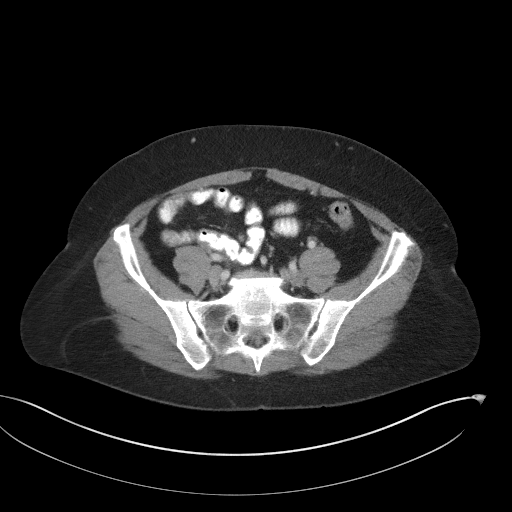
[im 40/100  soft-tissue]
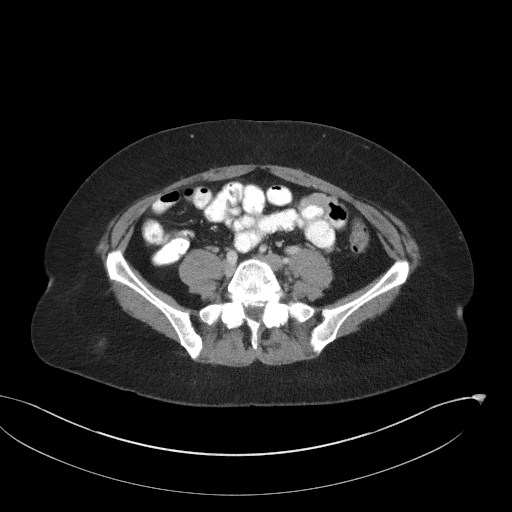
[im 45/100  soft-tissue]
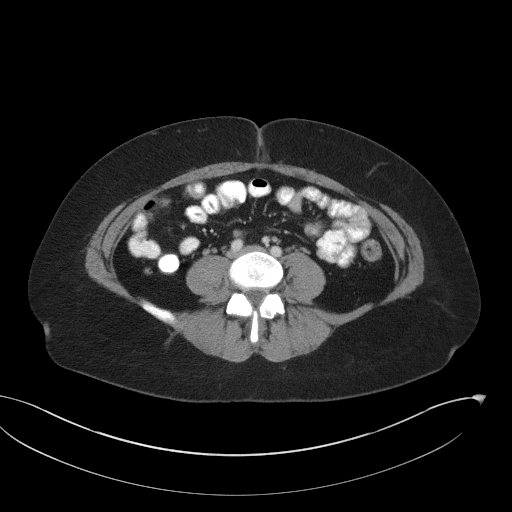
[im 55/100  soft-tissue]
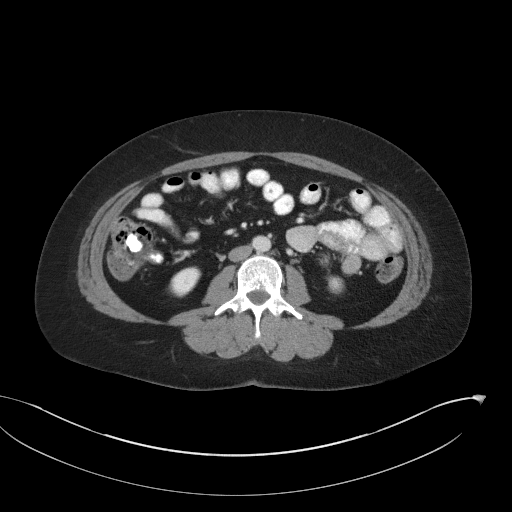
[im 60/100  soft-tissue]
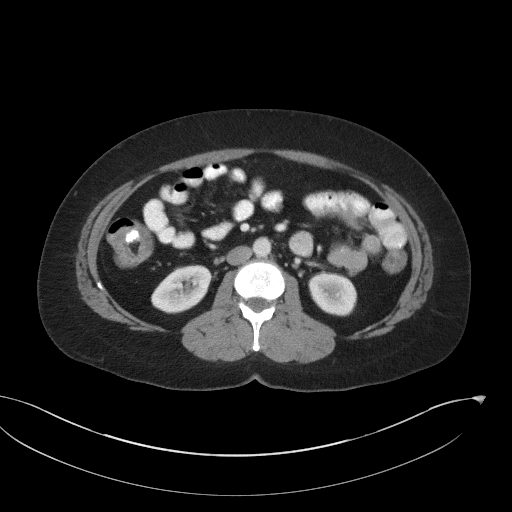
[im 60/100  bone]
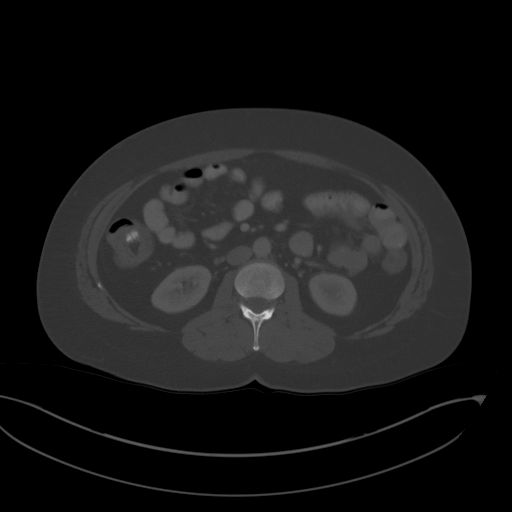
[im 65/100  soft-tissue]
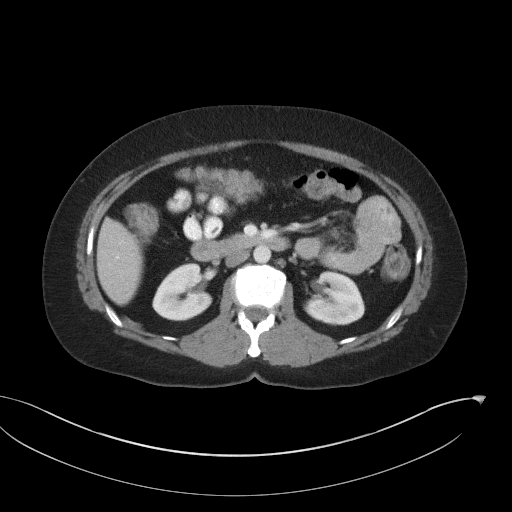
[im 75/100  soft-tissue]
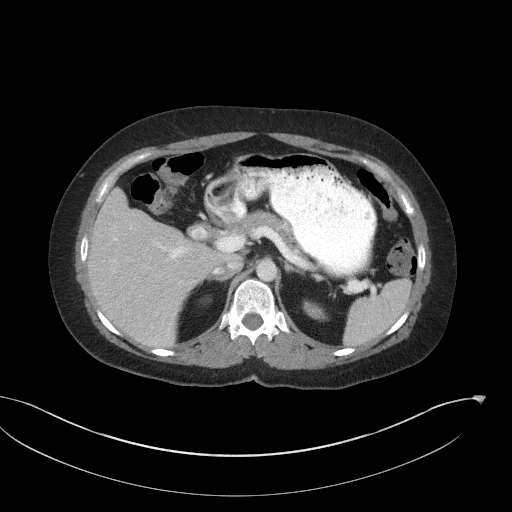
[im 80/100  soft-tissue]
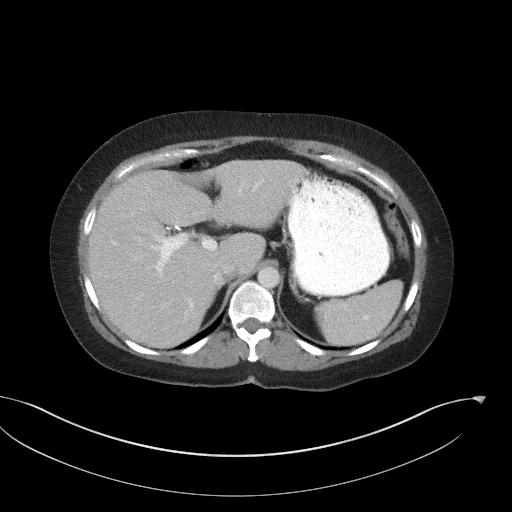
[im 85/100  soft-tissue]
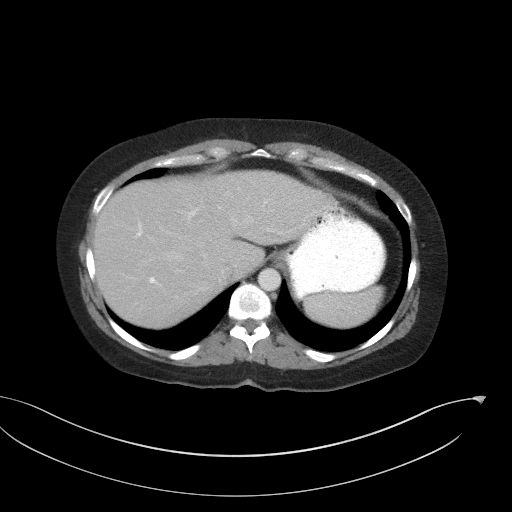
[im 95/100  soft-tissue]
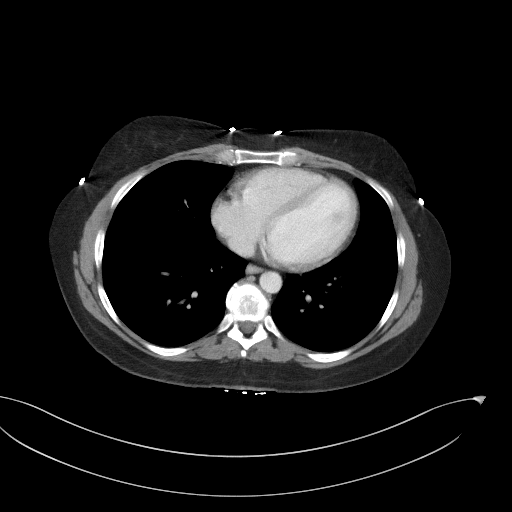

[Series 5: coronal st · coronal · 0.87mm/px · 3 of 97 slices shown]
[im 33/97  soft-tissue]
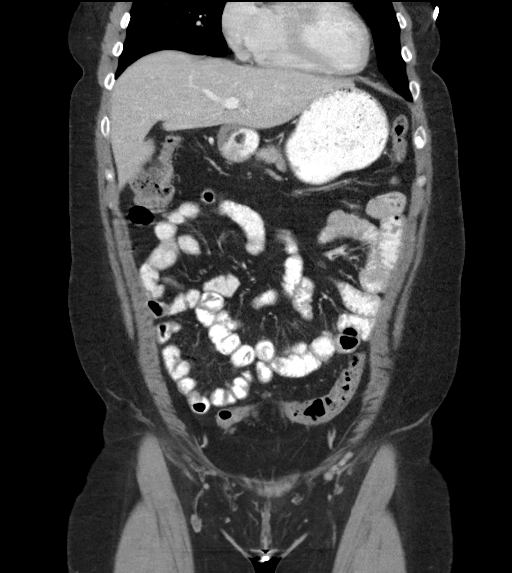
[im 43/97  soft-tissue]
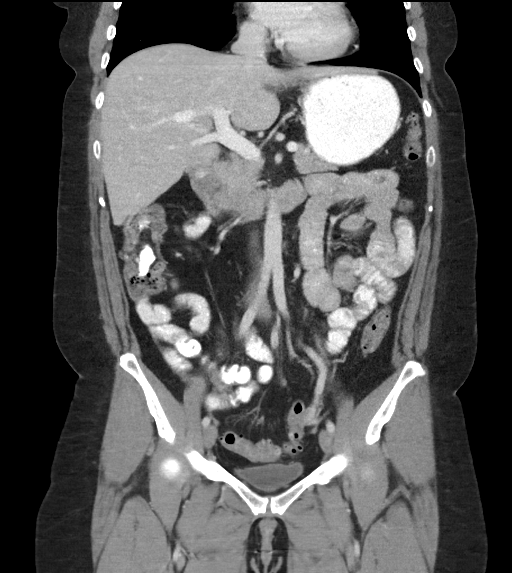
[im 54/97  soft-tissue]
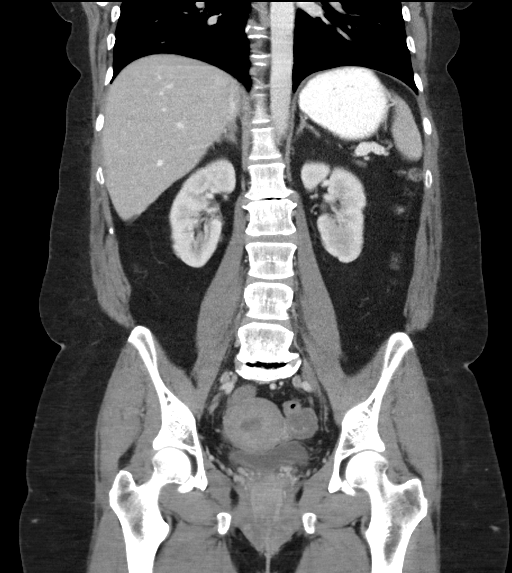

[17 of 46 positions shown; findings below may reference images not displayed]

FINDINGS: Lower Chest: No acute findings.

Hepatobiliary: No hepatic masses identified. Prior cholecystectomy.
No evidence of biliary obstruction.

Pancreas:  No mass or inflammatory changes.

Spleen: Within normal limits in size and appearance.

Adrenals/Urinary Tract: No masses identified. No evidence of
ureteral calculi or hydronephrosis.

Stomach/Bowel: No evidence of obstruction, inflammatory process or
abnormal fluid collections. Normal appendix visualized.
Diverticulosis is seen mainly involving the sigmoid colon, however
there is no evidence of diverticulitis.

Vascular/Lymphatic: No pathologically enlarged lymph nodes. No
abdominal aortic aneurysm.

Reproductive:  No mass or other significant abnormality.

Other:  None.

Musculoskeletal:  No suspicious bone lesions identified.
IMPRESSION: Colonic diverticulosis. No radiographic evidence of diverticulitis
or other acute findings.

No evidence of abdominal or pelvic neoplasm.
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
## 2020-12-28 ENCOUNTER — Telehealth: Payer: BC Managed Care – PPO | Admitting: Psychiatry

## 2020-12-28 ENCOUNTER — Telehealth: Payer: Self-pay | Admitting: Psychiatry

## 2020-12-28 ENCOUNTER — Other Ambulatory Visit: Payer: Self-pay

## 2020-12-28 NOTE — Telephone Encounter (Signed)
Sent link for video visit through Epic. Patient did not sign in. Called the patient for appointment scheduled today. The patient did not answer the phone. Left voice message to contact the office ([phone]).   ?

## 2021-01-12 ENCOUNTER — Other Ambulatory Visit: Payer: Self-pay | Admitting: Gastroenterology

## 2021-01-12 DIAGNOSIS — R197 Diarrhea, unspecified: Secondary | ICD-10-CM

## 2021-01-14 ENCOUNTER — Other Ambulatory Visit: Payer: Self-pay | Admitting: Gastroenterology

## 2021-01-14 DIAGNOSIS — K219 Gastro-esophageal reflux disease without esophagitis: Secondary | ICD-10-CM

## 2021-02-27 NOTE — Progress Notes (Signed)
Referring Provider: Kirstie Peri, MD Primary Care Physician:  Kirstie Peri, MD Primary GI Physician: Dr. Jena Gauss  Chief Complaint  Patient presents with   Gastroesophageal Reflux    Doing better as long as she takes med   Diarrhea    Doing ok    HPI:   Tonya Love is a 45 y.o. 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 difficile and GI pathogen panel negative in December 2020, toilet tissue hematochezia in the setting of hemorrhoids, mild anemia with iron panel on the low end of normal suspected to be influenced by menorrhagia. TCS and EGD February 2021 with diverticulosis in sigmoid colon, minimal grade 2 hemorrhoids, and benign random colon biopsies with recommendations to repeat in 10 years.  EGD with mild Schatzki ring s/p dilation, abnormal distal esophageal mucosa with benign biopsy, small hiatal hernia, otherwise normal exam. GES wnl in April 2021.  Celiac serologies negative. Thyroid function low in March 2021, low normal June 2021.  CT A/P with contrast 05/08/2020 with no significant findings.  She is presenting today for follow-up of GERD and diarrhea.  Last seen in our office 08/31/2020.  She had gained some of her weight back that she had lost.  Nausea/vomiting much improved with switching omeprazole to pantoprazole 40 mg twice daily and avoiding dairy products. Occasional nausea is associated with greasy or dairy products.  GERD well controlled on Protonix 40 mg twice daily.  Diarrhea and abdominal pain also improved with Questran 4 mg daily, occurring once a week or less.  Satisfied with her current bowel regimen.  She was advised to continue her current medications, low-fat diet, avoid dairy products, follow-up in 6 months.  Today:  GERD: Does well as long as she takes her  medicine. No N/V, abdominal pain. Rare dysphagia.  Does not feel the need for evaluation at this time.   Diarrhea: Doing ok with Questran 4g daily. 1 BM daily, soft formed. Occasional loose stools.  No BRBPR or melena.  Continues to avoid dairy. Limits greasy foods.   Taking iron daily. Went 5 months without a period. Just had one 2 weeks ago. Last 6-7 days. Changes a regular tampon every couple hours for 2-3 days.  Reports she went to donate plasma and was turned away due to her numbers being too low.  Occasional palpitations. Hair loss. No CP. SOB with activity without change. No SOB at rest.   Past Medical History:  Diagnosis Date   Anxiety    Depression    GERD (gastroesophageal reflux disease)    Migraines    Pancreatitis     Past Surgical History:  Procedure Laterality Date   BIOPSY  11/06/2019   Procedure: BIOPSY;  Surgeon: Corbin Ade, MD;  [facility];  Service: Endoscopy;;   CHOLECYSTECTOMY     COLONOSCOPY N/A 11/06/2019   Procedure: COLONOSCOPY;  Surgeon: Corbin Ade, diverticulosis in the sigmoid colon, minimal grade 2 hemorrhoids, otherwise normal.  Segmental biopsies benign.  Repeat colonoscopy in 10 years.   ESOPHAGOGASTRODUODENOSCOPY N/A 11/06/2019   Procedure: ESOPHAGOGASTRODUODENOSCOPY (EGD);  Surgeon: Corbin Ade, MD; mild Schatzki's ring s/p dilation, abnormal distal esophageal mucosa with benign biopsies, small hiatal hernia, otherwise normal.   MALONEY DILATION N/A 11/06/2019   Procedure: Elease Hashimoto DILATION;  Surgeon: Corbin Ade, MD;  [facility];  Service: Endoscopy;  Laterality: N/A;   TUBAL LIGATION      Current Outpatient Medications  Medication Sig Dispense Refill   cetirizine (ZYRTEC ALLERGY) 10 MG tablet Take 1 tablet (10 mg total) by mouth daily. 30 tablet 0   cholestyramine (QUESTRAN) 4 g packet TAKE 1 PACKET MIXED WITH 4 OUNCES OF WATER EVERY DAY WITH BREAKFAST TAKE OTHER MEDS 1 HOUR BEFORE OR 4 TO 6 HOURS 90 each  1   Fremanezumab-vfrm (AJOVY) 225 MG/1.5ML SOAJ INJECT 225 MG INTO THE SKIN EVERY 30 DAYS 1.5 mL 11   ibuprofen (ADVIL,MOTRIN) 200 MG tablet Take 600 mg by mouth every 6 (six) hours as needed for headache.     ipratropium (ATROVENT) 0.03 % nasal spray Place 2 sprays into both nostrils 2 (two) times daily as needed for rhinitis. 30 mL 0   pantoprazole (PROTONIX) 40 MG tablet TAKE 1 TABLET(40 MG) BY MOUTH TWICE DAILY 60 tablet 5   rizatriptan (MAXALT-MLT) 10 MG disintegrating tablet Take 1 tablet (10 mg total) by mouth as needed for migraine. May repeat in 2 hours if needed, no more than 2 pills in 24 hours. 9 tablet 11   ondansetron (ZOFRAN) 4 MG tablet Take 1 tablet (4 mg total) by mouth every 8 (eight) hours as needed for nausea or vomiting. (Patient not taking: No sig reported) 30 tablet 1   Current Facility-Administered Medications  Medication Dose Route Frequency Provider Last Rate Last Admin   ipratropium (ATROVENT) 0.03 % nasal spray 2 spray  2 spray Each Nare BID PRN Bing Neighbors, FNP        Allergies as of 03/01/2021 - Review Complete 03/01/2021  Allergen Reaction Noted   Codeine Nausea Only 12/25/2014    Family History  Problem Relation Age of Onset   Depression Maternal Aunt    Alcohol abuse Father    Depression Paternal Aunt    Alcohol abuse Paternal Grandfather    Colon cancer Neg Hx     Social History   Socioeconomic History   Marital status: Legally Separated    Spouse name: Not on file   Number of children: Not on file   Years of education: Not on file   Highest education level: Not on file  Occupational History   Not on file  Tobacco Use   Smoking status: Never   Smokeless tobacco: Never  Vaping Use   Vaping Use: Never used  Substance and Sexual Activity   Alcohol use: No   Drug use: No   Sexual activity: Yes    Birth control/protection: Pill  Other Topics Concern   Not on file  Social History Narrative   Not on file   Social Determinants of  Health   Financial Resource Strain: Not on file  Food Insecurity: Not on file  Transportation Needs: Not on file  Physical Activity: Not on file  Stress: Not on file  Social Connections: Not on file    Review of Systems: Gen: Denies fever, chills, presyncope, syncope. CV: See HPI Resp: See HPI GI: See HPI Heme: See HPI  Physical Exam: BP 110/72   Pulse 76   Temp (!) 97.3 F (36.3 C) (Temporal)   Ht 5\' 1"  (1.549 m)   Wt 183 lb 3.2 oz (83.1 kg)   LMP 02/15/2021 (Approximate)   BMI 34.62 kg/m  General:   Alert and oriented. No distress noted. Pleasant and cooperative.  Head:  Normocephalic and atraumatic. Eyes:  Conjuctiva clear without scleral icterus. Heart:  S1, S2  present without murmurs appreciated. Lungs:  Clear to auscultation bilaterally. No wheezes, rales, or rhonchi. No distress.  Abdomen:  +BS, soft, non-tender and non-distended. No rebound or guarding. No HSM or masses noted. Msk:  Symmetrical without gross deformities. Normal posture. Extremities:  Without edema. Neurologic:  Alert and  oriented x4 Psych:  Normal mood and affect.    Assessment: 45 year old female presenting today for follow-up of GERD and diarrhea.  Also with concerns of anemia.  GERD: Chronic.  Well-controlled on pantoprazole 40 mg twice daily.  Very rare dysphagia, but does not feel this needs further evaluation.  Last EGD February 2021 with mild Schatzki's ring s/p dilation.  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  Doing well on Questran 4 g daily and avoiding dairy products.  No alarm symptoms.  Colonoscopy up-to-date, due for repeat in 2031.  Anemia: Chronic history of mild anemia with low normal iron, hemoglobin typically in the upper 10/11 range.  Previously suspected to be secondary to menorrhagia. TCS and EGD February 2021 with diverticulosis in sigmoid colon, minimal grade 2 hemorrhoids, and benign random colon biopsies with  recommendations to repeat in 10 years.  EGD with mild Schatzki ring s/p dilation, abnormal distal esophageal mucosa with benign biopsy, small hiatal hernia, otherwise normal exam.  No Givens. She continues to take oral iron daily.  Reports recently trying to give plasma and was turned down due to her numbers being too low.  Menstrual cycles are irregular.  Reports no period for 5 months, then recently had one 2 weeks ago. We will update CBC and iron panel to reevaluate.  Patient also reported hair loss and intermittent palpitations. Previously TSH was low in March 2021, low normal in June 2021. Symptoms possibly secondary to thyroid abnormalities.  Recommended follow-up with PCP.   Plan: 1.  CBC and iron panel. 2.  Continue Protonix 40 mg twice daily 30 minutes before breakfast and dinner. 3.  Continue Questran 4 g daily. 4.  Continue lactose-free diet. 5.  Follow-up with PCP on hair loss and palpitations. 6.  Follow-up in our office in 6 months or sooner if needed.     Ermalinda Memos, PA-C [facility] 03/01/2021

## 2021-03-01 ENCOUNTER — Encounter: Payer: Self-pay | Admitting: Gastroenterology

## 2021-03-01 ENCOUNTER — Ambulatory Visit (INDEPENDENT_AMBULATORY_CARE_PROVIDER_SITE_OTHER): Payer: BC Managed Care – PPO | Admitting: Gastroenterology

## 2021-03-01 ENCOUNTER — Other Ambulatory Visit: Payer: Self-pay

## 2021-03-01 VITALS — BP 110/72 | HR 76 | Temp 97.3°F | Ht 61.0 in | Wt 183.2 lb

## 2021-03-01 DIAGNOSIS — R002 Palpitations: Secondary | ICD-10-CM

## 2021-03-01 DIAGNOSIS — R197 Diarrhea, unspecified: Secondary | ICD-10-CM | POA: Diagnosis not present

## 2021-03-01 DIAGNOSIS — K219 Gastro-esophageal reflux disease without esophagitis: Secondary | ICD-10-CM | POA: Diagnosis not present

## 2021-03-01 DIAGNOSIS — D649 Anemia, unspecified: Secondary | ICD-10-CM

## 2021-03-01 DIAGNOSIS — L659 Nonscarring hair loss, unspecified: Secondary | ICD-10-CM | POA: Diagnosis not present

## 2021-03-01 LAB — IRON,TIBC AND FERRITIN PANEL
%SAT: 19 % (calc) (ref 16–45)
Ferritin: 29 ng/mL (ref 16–232)
Iron: 66 ug/dL (ref 40–190)
TIBC: 346 mcg/dL (calc) (ref 250–450)

## 2021-03-01 LAB — CBC WITH DIFFERENTIAL/PLATELET
Absolute Monocytes: 334 cells/uL (ref 200–950)
Basophils Absolute: 19 cells/uL (ref 0–200)
Basophils Relative: 0.3 %
Eosinophils Absolute: 151 cells/uL (ref 15–500)
Eosinophils Relative: 2.4 %
HCT: 34.8 % — ABNORMAL LOW (ref 35.0–45.0)
Hemoglobin: 11.4 g/dL — ABNORMAL LOW (ref 11.7–15.5)
Lymphs Abs: 2388 cells/uL (ref 850–3900)
MCH: 30.4 pg (ref 27.0–33.0)
MCHC: 32.8 g/dL (ref 32.0–36.0)
MCV: 92.8 fL (ref 80.0–100.0)
MPV: 9.3 fL (ref 7.5–12.5)
Monocytes Relative: 5.3 %
Neutro Abs: 3408 cells/uL (ref 1500–7800)
Neutrophils Relative %: 54.1 %
Platelets: 368 10*3/uL (ref 140–400)
RBC: 3.75 10*6/uL — ABNORMAL LOW (ref 3.80–5.10)
RDW: 12.5 % (ref 11.0–15.0)
Total Lymphocyte: 37.9 %
WBC: 6.3 10*3/uL (ref 3.8–10.8)

## 2021-03-01 NOTE — Patient Instructions (Signed)
Please have blood work completed at [facility].  Continue Protonix 40 mg twice daily 30 minutes before breakfast and dinner.  Continue Questran 4 g daily.  Remember to take other medications 1 hour prior or 4 to 6 hours after taking Questran.  We will plan to follow-up in 6 months.  We will call you with your lab work and further recommendations.  It was good to see you today!  Ermalinda Memos, PA-C [facility]

## 2021-03-11 ENCOUNTER — Ambulatory Visit: Payer: BC Managed Care – PPO

## 2021-03-11 ENCOUNTER — Ambulatory Visit (INDEPENDENT_AMBULATORY_CARE_PROVIDER_SITE_OTHER): Payer: BC Managed Care – PPO

## 2021-03-11 ENCOUNTER — Ambulatory Visit
Admission: EM | Admit: 2021-03-11 | Discharge: 2021-03-11 | Disposition: A | Discharge status: Home / Self Care | Payer: BC Managed Care – PPO | Attending: Emergency Medicine | Admitting: Emergency Medicine

## 2021-03-11 ENCOUNTER — Encounter: Payer: Self-pay | Admitting: Emergency Medicine

## 2021-03-11 ENCOUNTER — Other Ambulatory Visit: Payer: Self-pay

## 2021-03-11 DIAGNOSIS — M25531 Pain in right wrist: Secondary | ICD-10-CM | POA: Diagnosis not present

## 2021-03-11 DIAGNOSIS — M654 Radial styloid tenosynovitis [de Quervain]: Secondary | ICD-10-CM

## 2021-03-11 MED ORDER — PREDNISONE 20 MG PO TABS: 20.0000 mg | ORAL_TABLET | Freq: Two times a day (BID) | ORAL | 0 refills | Status: AC

## 2021-03-11 NOTE — ED Triage Notes (Signed)
Wrist pain for approx a year ago.  Has been seen before and was told it was tendonitis but did not have a xray.

## 2021-03-11 NOTE — Discharge Instructions (Addendum)
Continue conservative management of rest, ice, and elevation Prednisone prescribed.  Take as directed and to completion Wrist splint applied Follow up with hand specialist for further evaluation and management Return or go to the ER if you have any new or worsening symptoms (fever, chills, chest pain, redness, swelling, bruising, deformity, etc...)

## 2021-03-11 NOTE — ED Provider Notes (Signed)
[facility]   [id number] 03/11/21 Arrival Time: 1412  CC: RT wrist PAIN  SUBJECTIVE: History from: patient. Tonya Love is a 45 y.o. female complains of RT wrist pain x 1 year.  Denies a precipitating event or specific injury, but admits to a lot of repetitive movements with work.  Localizes the pain to the posterior forearm.  Describes the pain as intermittent and sharp in character.  Has tried OTC medications without relief.  Symptoms are made worse with work activities.  Reports similar symptoms in the past.  Denies fever, chills, erythema, ecchymosis, effusion, weakness, numbness and tingling.  ROS: As per HPI.  All other pertinent ROS negative.     Past Medical History:  Diagnosis Date   Anxiety    Depression    GERD (gastroesophageal reflux disease)    Migraines    Pancreatitis    Past Surgical History:  Procedure Laterality Date   BIOPSY  11/06/2019   Procedure: BIOPSY;  Surgeon: Corbin Ade, MD;  [facility];  Service: Endoscopy;;   CHOLECYSTECTOMY     COLONOSCOPY N/A 11/06/2019   Procedure: COLONOSCOPY;  Surgeon: Corbin Ade, diverticulosis in the sigmoid colon, minimal grade 2 hemorrhoids, otherwise normal.  Segmental biopsies benign.  Repeat colonoscopy in 10 years.   ESOPHAGOGASTRODUODENOSCOPY N/A 11/06/2019   Procedure: ESOPHAGOGASTRODUODENOSCOPY (EGD);  Surgeon: Corbin Ade, MD; mild Schatzki's ring s/p dilation, abnormal distal esophageal mucosa with benign biopsies, small hiatal hernia, otherwise normal.   MALONEY DILATION N/A 11/06/2019   Procedure: Elease Hashimoto DILATION;  Surgeon: Corbin Ade, MD;  [facility];  Service: Endoscopy;  Laterality: N/A;   TUBAL LIGATION     Allergies  Allergen Reactions   Codeine Nausea Only   Current Facility-Administered Medications on File Prior to Encounter  Medication Dose Route Frequency Provider Last Rate Last Admin   ipratropium (ATROVENT) 0.03 % nasal spray 2 spray  2 spray  Each Nare BID PRN Bing Neighbors, FNP       Current Outpatient Medications on File Prior to Encounter  Medication Sig Dispense Refill   cetirizine (ZYRTEC ALLERGY) 10 MG tablet Take 1 tablet (10 mg total) by mouth daily. 30 tablet 0   cholestyramine (QUESTRAN) 4 g packet TAKE 1 PACKET MIXED WITH 4 OUNCES OF WATER EVERY DAY WITH BREAKFAST TAKE OTHER MEDS 1 HOUR BEFORE OR 4 TO 6 HOURS 90 each 1   Fremanezumab-vfrm (AJOVY) 225 MG/1.5ML SOAJ INJECT 225 MG INTO THE SKIN EVERY 30 DAYS 1.5 mL 11   ibuprofen (ADVIL,MOTRIN) 200 MG tablet Take 600 mg by mouth every 6 (six) hours as needed for headache.     ipratropium (ATROVENT) 0.03 % nasal spray Place 2 sprays into both nostrils 2 (two) times daily as needed for rhinitis. 30 mL 0   ondansetron (ZOFRAN) 4 MG tablet Take 1 tablet (4 mg total) by mouth every 8 (eight) hours as needed for nausea or vomiting. (Patient not taking: No sig reported) 30 tablet 1   pantoprazole (PROTONIX) 40 MG tablet TAKE 1 TABLET(40 MG) BY MOUTH TWICE DAILY 60 tablet 5   rizatriptan (MAXALT-MLT) 10 MG disintegrating tablet Take 1 tablet (10 mg total) by mouth as needed for migraine. May repeat in 2 hours if needed, no more than 2 pills in 24 hours. 9 tablet 11   [DISCONTINUED] mirtazapine (REMERON) 30 MG tablet Take 1 tablet (30 mg total) by mouth at bedtime. 30 tablet 2   Social History   Socioeconomic History  Marital status: Legally Separated    Spouse name: Not on file   Number of children: Not on file   Years of education: Not on file   Highest education level: Not on file  Occupational History   Not on file  Tobacco Use   Smoking status: Never   Smokeless tobacco: Never  Vaping Use   Vaping Use: Never used  Substance and Sexual Activity   Alcohol use: No   Drug use: No   Sexual activity: Yes    Birth control/protection: Pill  Other Topics Concern   Not on file  Social History Narrative   Not on file   Social Determinants of Health   Financial  Resource Strain: Not on file  Food Insecurity: Not on file  Transportation Needs: Not on file  Physical Activity: Not on file  Stress: Not on file  Social Connections: Not on file  Intimate Partner Violence: Not on file   Family History  Problem Relation Age of Onset   Depression Maternal Aunt    Alcohol abuse Father    Depression Paternal Aunt    Alcohol abuse Paternal Grandfather    Colon cancer Neg Hx     OBJECTIVE:  Vitals:   03/11/21 1433  BP: 107/72  Pulse: 84  Resp: 19  Temp: 98.4 F (36.9 C)  TempSrc: Oral  SpO2: 98%    General appearance: ALERT; in no acute distress.  Head: NCAT Lungs: Normal respiratory effort CV: Radial pulse 2+ Musculoskeletal: RT wrist Inspection: Skin warm, dry, clear and intact without obvious erythema, effusion, or ecchymosis.  Palpation: TTP over posterior lateral wrist, snuff box tenderness, + finkelstein's maneuver ROM: LROM Strength: decreased Skin: warm and dry Neurologic: Ambulates without difficulty; Sensation intact about the upper extremities Psychological: alert and cooperative; normal mood and affect  DIAGNOSTIC STUDIES:  DG Wrist Complete Right  Result Date: 03/11/2021 CLINICAL DATA:  Pain EXAM: RIGHT WRIST - COMPLETE 3+ VIEW COMPARISON:  None. FINDINGS: There is no evidence of fracture or dislocation. There is no evidence of arthropathy or other focal bone abnormality. Soft tissues are unremarkable. IMPRESSION: Negative. Electronically Signed   By: Signa Kell M.D.   On: 03/11/2021 15:14     X-rays negative for bony abnormalities including fracture, or dislocation.  No soft tissue swelling.    I have reviewed the x-rays myself and the radiologist interpretation. I am in agreement with the radiologist interpretation.     ASSESSMENT & PLAN:  1. Right wrist pain   2. Tendinitis, de Quervain's     Meds ordered this encounter  Medications   predniSONE (DELTASONE) 20 MG tablet    Sig: Take 1 tablet (20 mg total)  by mouth 2 (two) times daily with a meal for 5 days.    Dispense:  10 tablet    Refill:  0    Order Specific Question:   Supervising Provider    Answer:   Eustace Moore [[id number]]     Continue conservative management of rest, ice, and elevation Prednisone prescribed.  Take as directed and to completion Wrist splint applied Follow up with hand specialist for further evaluation and management Return or go to the ER if you have any new or worsening symptoms (fever, chills, chest pain, redness, swelling, bruising, deformity, etc...)    Reviewed expectations re: course of current medical issues. Questions answered. Outlined signs and symptoms indicating need for more acute intervention. Patient verbalized understanding. After Visit Summary given.     Alvino Chapel, [place], PA-C  03/11/21 1529  

## 2021-03-15 ENCOUNTER — Telehealth: Payer: Self-pay

## 2021-03-15 NOTE — Telephone Encounter (Signed)
PA done on form , if denied pt will have to change medications or pay. The pt's insurance does not cover this medication per pt's pharmacy.

## 2021-03-16 ENCOUNTER — Telehealth: Payer: Self-pay

## 2021-03-16 NOTE — Telephone Encounter (Signed)
Pt has been approved for Pantoprazole Sodium 40mg  DR tabs. ([id number]---approved from 2021-04-04 through 04-04-2022---Dx codes: K21.9 ,K58.0--tried/failed: protonix, Bentyl, Omeprazole, Dexilant (cost too much). This was done through [employer] after [facility] put in the key code. Will give to 03/18/2022 to scan

## 2021-03-24 ENCOUNTER — Telehealth: Payer: Self-pay | Admitting: Adult Health

## 2021-03-24 NOTE — Telephone Encounter (Signed)
LMVM for pt that returned call.  °

## 2021-03-24 NOTE — Telephone Encounter (Signed)
Sounds like a different type of headache. I can see her or if Dr. Frances Furbish has an opening.

## 2021-03-24 NOTE — Telephone Encounter (Signed)
Pt returned call to [place]. Please call.

## 2021-03-24 NOTE — Telephone Encounter (Signed)
I called pt.  She is c/o of problem with sharp pains (stabbing pains every day for about a week times) location front and behind temples (bilateral) causes some blurry vision.    She states she get about 10-15 per day after the stabbing pain she will have dull feeling for awhile.  She does not get migraine after these.  Motrin has not helped.  I relayed did not think related to migraines but would be glad to ask.  She took last ajovy 2 wks ago and states rizatriptan not working like it did.  I had to move her appt since she did not have mychart and had a mychart VV appt.  She preferred OV.  I told her this could be new problem and see pcp initially and then would need to see MD.  Let me know your thoughts.

## 2021-03-24 NOTE — Telephone Encounter (Signed)
Pt states for about a week now she is having sharp pain in her head multiple times a day in addition to blurry vision, please call pt to discuss.

## 2021-03-25 NOTE — Telephone Encounter (Signed)
Called and LMVM for pt that made appt for her on Monday 03-29-21 At 0930 arrive 0900 for check in. Address different kind headache,  sharp stabbing pains.  She will call back if this does not work for her.

## 2021-03-29 ENCOUNTER — Ambulatory Visit: Payer: Self-pay | Admitting: Neurology

## 2021-04-16 ENCOUNTER — Telehealth: Payer: Self-pay | Admitting: Adult Health

## 2021-04-16 NOTE — Telephone Encounter (Signed)
Pt called, pharmacy told me they need a PA for Fremanezumab-vfrm (AJOVY) 225 MG/1.5ML SOAJ. Would like a call from the nurse

## 2021-04-19 NOTE — Telephone Encounter (Signed)
This was placed in work que.

## 2021-04-20 ENCOUNTER — Encounter: Payer: Self-pay | Admitting: Adult Health

## 2021-04-20 ENCOUNTER — Other Ambulatory Visit: Payer: Self-pay

## 2021-04-20 ENCOUNTER — Ambulatory Visit (INDEPENDENT_AMBULATORY_CARE_PROVIDER_SITE_OTHER): Payer: BC Managed Care – PPO | Admitting: Adult Health

## 2021-04-20 VITALS — BP 122/76 | HR 77 | Ht 61.0 in | Wt 185.0 lb

## 2021-04-20 DIAGNOSIS — R350 Frequency of micturition: Secondary | ICD-10-CM | POA: Diagnosis not present

## 2021-04-20 DIAGNOSIS — G43709 Chronic migraine without aura, not intractable, without status migrainosus: Secondary | ICD-10-CM | POA: Diagnosis not present

## 2021-04-20 DIAGNOSIS — Z8719 Personal history of other diseases of the digestive system: Secondary | ICD-10-CM | POA: Diagnosis not present

## 2021-04-20 DIAGNOSIS — R1030 Lower abdominal pain, unspecified: Secondary | ICD-10-CM | POA: Diagnosis not present

## 2021-04-20 DIAGNOSIS — G4489 Other headache syndrome: Secondary | ICD-10-CM | POA: Diagnosis not present

## 2021-04-20 NOTE — Patient Instructions (Signed)
Your Plan:  Continue ajovy and maxalt Blood work today .mm     Thank you for coming to see [place] at [facility]. I hope we have been able to provide you high quality care today.  You may receive a patient satisfaction survey over the next few weeks. We would appreciate your feedback and comments so that we may continue to improve ourselves and the health of our patients.

## 2021-04-20 NOTE — Telephone Encounter (Signed)
CMM # Key: [id number] Initiated Ajovy.  Determination pending.

## 2021-04-20 NOTE — Progress Notes (Addendum)
PATIENT: Tonya Love DOB: December 18, 1975  REASON FOR VISIT: follow up HISTORY FROM: patient Primary neurologist: Dr. Frances Furbish  HISTORY OF PRESENT ILLNESS: Today 04/20/21:  Ms. Tonya Love is a 45 year old female with a history of migraine headaches.  She continues on Ajovy and Maxalt.  She reports that she is 2 weeks behind on Ajovy due to prior authorization.  She states that she is having sharp shooting pain in the temporal regions and across the forehead.  She reports some blurry vision when she has these episodes.  Reports that they are very brief and only last for seconds.  This is only been occurring in the last month.   05/05/20: Ms. Tonya Love is a 45 year old female with a history of migraine. She returns today for follow-up. She remains on Ajovy.  She reports that this controls her headache.  She typically gets headaches the week before the next dose is due.  She states that her headaches typically occur across the forehead.  She does have photophobia and phonophobia.  On occasion she will have nausea.  She reports that Maxalt continues to give her good benefit.  On occasion she will have to take a second dose.  HISTORY 11/04/2019: She reports that the Maxalt helps, some days better than others, she needs a prescription for Zofran as she is out.  Her migraine frequency has increased a little bit, she can have 2 or 3 migraines per week, they can last 24 to 36 hours.  She has not been on preventative medication in the past but has tried as needed medication in the past.  She has had some intermittent palpitations and chest discomfort, also jitteriness.  This is not after taking Maxalt actually.  It can be when she is just sitting and she feels unwell, she has noticed on her health tracker watch that her pulse rate can go up to the 150s, yesterday she had a similar episode in her pulse rate was 158.  She has not seen her primary care physician or nurse practitioner yet for this.  She has not seen  a cardiologist in the past.  She has not seen a correlation between her caffeine intake and her symptoms and also she had not skipped any meals.  She tries to hydrate well with water, drinks caffeine in the form of [facility] occasionally, not every day, does not like coffee.   The patient's allergies, current medications, family history, past medical history, past social history, past surgical history and problem list were reviewed and updated as appropriate.      REVIEW OF SYSTEMS: Out of a complete 14 system review of symptoms, the patient complains only of the following symptoms, and all other reviewed systems are negative.  See HPI  ALLERGIES: Allergies  Allergen Reactions   Codeine Nausea Only    HOME MEDICATIONS: Outpatient Medications Prior to Visit  Medication Sig Dispense Refill   cholestyramine (QUESTRAN) 4 g packet TAKE 1 PACKET MIXED WITH 4 OUNCES OF WATER EVERY DAY WITH BREAKFAST TAKE OTHER MEDS 1 HOUR BEFORE OR 4 TO 6 HOURS 90 each 1   Fremanezumab-vfrm (AJOVY) 225 MG/1.5ML SOAJ INJECT 225 MG INTO THE SKIN EVERY 30 DAYS 1.5 mL 11   ibuprofen (ADVIL,MOTRIN) 200 MG tablet Take 600 mg by mouth every 6 (six) hours as needed for headache.     ipratropium (ATROVENT) 0.03 % nasal spray Place 2 sprays into both nostrils 2 (two) times daily as needed for rhinitis. 30 mL 0  ondansetron (ZOFRAN) 4 MG tablet Take 1 tablet (4 mg total) by mouth every 8 (eight) hours as needed for nausea or vomiting. 30 tablet 1   pantoprazole (PROTONIX) 40 MG tablet TAKE 1 TABLET(40 MG) BY MOUTH TWICE DAILY 60 tablet 5   rizatriptan (MAXALT-MLT) 10 MG disintegrating tablet Take 1 tablet (10 mg total) by mouth as needed for migraine. May repeat in 2 hours if needed, no more than 2 pills in 24 hours. 9 tablet 11   cetirizine (ZYRTEC ALLERGY) 10 MG tablet Take 1 tablet (10 mg total) by mouth daily. 30 tablet 0   Facility-Administered Medications Prior to Visit  Medication Dose Route Frequency Provider  Last Rate Last Admin   ipratropium (ATROVENT) 0.03 % nasal spray 2 spray  2 spray Each Nare BID PRN Bing Neighbors, FNP        PAST MEDICAL HISTORY: Past Medical History:  Diagnosis Date   Anxiety    Depression    GERD (gastroesophageal reflux disease)    Migraines    Pancreatitis     PAST SURGICAL HISTORY: Past Surgical History:  Procedure Laterality Date   BIOPSY  11/06/2019   Procedure: BIOPSY;  Surgeon: Corbin Ade, MD;  [facility];  Service: Endoscopy;;   CHOLECYSTECTOMY     COLONOSCOPY N/A 11/06/2019   Procedure: COLONOSCOPY;  Surgeon: Corbin Ade, diverticulosis in the sigmoid colon, minimal grade 2 hemorrhoids, otherwise normal.  Segmental biopsies benign.  Repeat colonoscopy in 10 years.   ESOPHAGOGASTRODUODENOSCOPY N/A 11/06/2019   Procedure: ESOPHAGOGASTRODUODENOSCOPY (EGD);  Surgeon: Corbin Ade, MD; mild Schatzki's ring s/p dilation, abnormal distal esophageal mucosa with benign biopsies, small hiatal hernia, otherwise normal.   MALONEY DILATION N/A 11/06/2019   Procedure: Elease Hashimoto DILATION;  Surgeon: Corbin Ade, MD;  [facility];  Service: Endoscopy;  Laterality: N/A;   TUBAL LIGATION      FAMILY HISTORY: Family History  Problem Relation Age of Onset   Depression Maternal Aunt    Alcohol abuse Father    Depression Paternal Aunt    Alcohol abuse Paternal Grandfather    Colon cancer Neg Hx     SOCIAL HISTORY: Social History   Socioeconomic History   Marital status: Legally Separated    Spouse name: Not on file   Number of children: Not on file   Years of education: Not on file   Highest education level: Not on file  Occupational History   Not on file  Tobacco Use   Smoking status: Never   Smokeless tobacco: Never  Vaping Use   Vaping Use: Never used  Substance and Sexual Activity   Alcohol use: No   Drug use: No   Sexual activity: Yes    Birth control/protection: Pill  Other Topics Concern   Not on file   Social History Narrative   Not on file   Social Determinants of Health   Financial Resource Strain: Not on file  Food Insecurity: Not on file  Transportation Needs: Not on file  Physical Activity: Not on file  Stress: Not on file  Social Connections: Not on file  Intimate Partner Violence: Not on file      PHYSICAL EXAM  Vitals:   04/20/21 0912  BP: 122/76  Pulse: 77  Weight: 185 lb (83.9 kg)  Height: 5\' 1"  (1.549 m)   Body mass index is 34.96 kg/m.  Generalized: Well developed, in no acute distress   Neurological examination  Mentation: Alert oriented to time, place,  history taking. Follows all commands speech and language fluent Cranial nerve II-XII: Pupils were equal round reactive to light. Extraocular movements were full, visual field were full on confrontational test.. Head turning and shoulder shrug  were normal and symmetric. Motor: The motor testing reveals 5 over 5 strength of all 4 extremities. Good symmetric motor tone is noted throughout.  Sensory: Sensory testing is intact to soft touch on all 4 extremities. No evidence of extinction is noted.  Coordination: Cerebellar testing reveals good finger-nose-finger and heel-to-shin bilaterally.  Gait and station: Gait is normal.  Reflexes: Deep tendon reflexes are symmetric and normal bilaterally.   DIAGNOSTIC DATA (LABS, IMAGING, TESTING) - I reviewed patient records, labs, notes, testing and imaging myself where available.  Lab Results  Component Value Date   WBC 6.3 03/01/2021   HGB 11.4 (L) 03/01/2021   HCT 34.8 (L) 03/01/2021   MCV 92.8 03/01/2021   PLT 368 03/01/2021      Component Value Date/Time   NA 143 08/26/2019 1551   NA 140 08/05/2019 1215   K 4.2 08/26/2019 1551   CL 104 08/26/2019 1551   CO2 20 08/26/2019 1551   GLUCOSE 88 08/26/2019 1551   BUN 8 08/26/2019 1551   BUN 7 08/05/2019 1215   CREATININE 0.80 05/08/2020 1530   CREATININE 0.96 08/26/2019 1551   CALCIUM 9.5 08/26/2019 1551    PROT 7.0 08/05/2019 1215   ALBUMIN 4.3 08/05/2019 1215   AST 13 08/05/2019 1215   ALT 13 08/05/2019 1215   ALKPHOS 72 08/05/2019 1215   BILITOT 0.3 08/05/2019 1215   GFRNONAA 73 08/26/2019 1551   GFRAA 85 08/26/2019 1551    Lab Results  Component Value Date   TSH 0.58 08/26/2019      ASSESSMENT AND PLAN 45 y.o. year old female  has a past medical history of Anxiety, Depression, GERD (gastroesophageal reflux disease), Migraines, and Pancreatitis. here with:  1.  Migraine headaches  Continue Ajovy-sample given today since prescription is delayed  continue Maxalt for abortive therapy  2.  Stabbing headache  Will check sedimentation rate and CRP to rule out temporal arteritis although this seems unlikely If blood work is unremarkable the patient will let us know if her symptoms continue after taking her dose of Ajovy today.  May consider adding on a daily medication if symptoms continue Follow-up in 6 months or sooner if needed    [name], MSN, NP-C 04/20/2021, 9:17 AM [facility] [address], [address] [place], [place] [address] [phone]  I reviewed the above note and documentation by the Nurse Practitioner and agree with the history, exam, assessment and plan as outlined above. I was available for consultation. Huston Foley, MD, PhD [facility])

## 2021-04-21 LAB — C-REACTIVE PROTEIN: CRP: 57 mg/L — ABNORMAL HIGH (ref 0–10)

## 2021-04-21 LAB — SEDIMENTATION RATE: Sed Rate: 2 mm/hr (ref 0–32)

## 2021-04-22 NOTE — Telephone Encounter (Signed)
Received denial for this ajovy for this pt.  Is covered if she has tried amitriptyline to venlafaxine for at least 2 months or cannot use these drugs. Or tried divalproex or topiramate , tried one beta blocker drug or candesartan.  Please advise. I donot see that she tried these.

## 2021-04-26 ENCOUNTER — Encounter: Payer: Self-pay | Admitting: Emergency Medicine

## 2021-04-26 ENCOUNTER — Other Ambulatory Visit: Payer: Self-pay

## 2021-04-26 ENCOUNTER — Ambulatory Visit
Admission: EM | Admit: 2021-04-26 | Discharge: 2021-04-26 | Disposition: A | Discharge status: Home / Self Care | Payer: BC Managed Care – PPO | Attending: Family Medicine | Admitting: Family Medicine

## 2021-04-26 DIAGNOSIS — S46211A Strain of muscle, fascia and tendon of other parts of biceps, right arm, initial encounter: Secondary | ICD-10-CM

## 2021-04-26 MED ORDER — INDOMETHACIN 50 MG PO CAPS: 50.0000 mg | ORAL_CAPSULE | Freq: Two times a day (BID) | ORAL | 0 refills | Status: AC

## 2021-04-26 NOTE — ED Provider Notes (Signed)
[facility]    CSN: [id number] Arrival date & time: 04/26/21  0907      History   Chief Complaint Chief Complaint  Patient presents with   Arm Pain    HPI Tonya Love is a 45 y.o. female.   HPI Patient in today for evaluation of right bicep pain.  Patient reports pain has been present for about a week.  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  She denies any specific injury however is just having localized pain in her mid bicep region.  No swelling or redness.  She has taken a couple of low doses of ibuprofen without relief of pain.  Past Medical History:  Diagnosis Date   Anxiety    Depression    GERD (gastroesophageal reflux disease)    Migraines    Pancreatitis     Patient Active Problem List   Diagnosis Date Noted   Early satiety 12/18/2019   Dysphagia 08/27/2019   Anemia 08/26/2019   Diarrhea 08/26/2019   GERD (gastroesophageal reflux disease) 08/26/2019   Nausea with vomiting 08/26/2019   History of diverticulitis 08/26/2019   Rectal bleeding 08/26/2019   Anxiety state, unspecified 02/11/2014    Past Surgical History:  Procedure Laterality Date   BIOPSY  11/06/2019   Procedure: BIOPSY;  Surgeon: Corbin Ade, MD;  [facility];  Service: Endoscopy;;   CHOLECYSTECTOMY     COLONOSCOPY N/A 11/06/2019   Procedure: COLONOSCOPY;  Surgeon: Corbin Ade, diverticulosis in the sigmoid colon, minimal grade 2 hemorrhoids, otherwise normal.  Segmental biopsies benign.  Repeat colonoscopy in 10 years.   ESOPHAGOGASTRODUODENOSCOPY N/A 11/06/2019   Procedure: ESOPHAGOGASTRODUODENOSCOPY (EGD);  Surgeon: Corbin Ade, MD; mild Schatzki's ring s/p dilation, abnormal distal esophageal mucosa with benign biopsies, small hiatal hernia, otherwise normal.   MALONEY DILATION N/A 11/06/2019   Procedure: Elease Hashimoto DILATION;  Surgeon: Corbin Ade, MD;  [facility];  Service: Endoscopy;  Laterality: N/A;   TUBAL LIGATION      OB History     Gravida      Para      Term      Preterm      AB      Living  2      SAB      IAB      Ectopic      Multiple      Live Births               Home Medications    Prior to Admission medications   Medication Sig Start Date End Date Taking? Authorizing Provider  indomethacin (INDOCIN) 50 MG capsule Take 1 capsule (50 mg total) by mouth 2 (two) times daily with a meal for 7 days. 04/26/21 05/03/21 Yes Bing Neighbors, FNP  cetirizine (ZYRTEC ALLERGY) 10 MG tablet Take 1 tablet (10 mg total) by mouth daily. 08/15/20 03/01/21  Bing Neighbors, FNP  cholestyramine (QUESTRAN) 4 g packet TAKE 1 PACKET MIXED WITH 4 OUNCES OF WATER EVERY DAY WITH BREAKFAST TAKE OTHER MEDS 1 HOUR BEFORE OR 4 TO 6 HOURS 01/12/21   Clearance Coots, Gwendalyn Ege, PA-C  Fremanezumab-vfrm (AJOVY) 225 MG/1.5ML SOAJ INJECT 225 MG INTO THE SKIN EVERY 30 DAYS 05/05/20   [name], NP  ibuprofen (ADVIL,MOTRIN) 200 MG tablet Take 600 mg by mouth every 6 (six) hours as needed  for headache.    [provider]  ipratropium (ATROVENT) 0.03 % nasal spray Place 2 sprays into both nostrils 2 (two) times daily as needed for rhinitis. 08/15/20   Bing Neighbors, FNP  ondansetron (ZOFRAN) 4 MG tablet Take 1 tablet (4 mg total) by mouth every 8 (eight) hours as needed for nausea or vomiting. 12/18/19   [name], PA-C  pantoprazole (PROTONIX) 40 MG tablet TAKE 1 TABLET(40 MG) BY MOUTH TWICE DAILY 01/14/21   [name], PA-C  rizatriptan (MAXALT-MLT) 10 MG disintegrating tablet Take 1 tablet (10 mg total) by mouth as needed for migraine. May repeat in 2 hours if needed, no more than 2 pills in 24 hours. 05/05/20   [name], NP  mirtazapine (REMERON) 30 MG tablet Take 1 tablet (30 mg total) by mouth at bedtime. 10/25/20 12/01/20  [name], MD    Family History Family History   Problem Relation Age of Onset   Depression Maternal Aunt    Alcohol abuse Father    Depression Paternal Aunt    Alcohol abuse Paternal Grandfather    Colon cancer Neg Hx     Social History Social History   Tobacco Use   Smoking status: Never   Smokeless tobacco: Never  Vaping Use   Vaping Use: Never used  Substance Use Topics   Alcohol use: No   Drug use: No     Allergies   Codeine   Review of Systems Review of Systems Pertinent negatives listed in HPI  Physical Exam Triage Vital Signs ED Triage Vitals  Enc Vitals Group     BP 04/26/21 0914 107/73     Pulse Rate 04/26/21 0914 86     Resp 04/26/21 0914 18     Temp 04/26/21 0914 98.8 F (37.1 C)     Temp src --      SpO2 04/26/21 0914 96 %     Weight --      Height --      Head Circumference --      Peak Flow --      Pain Score 04/26/21 0913 8     Pain Loc --      Pain Edu? --      Excl. in GC? --    No data found.  Updated Vital Signs BP 107/73   Pulse 86   Temp 98.8 F (37.1 C)   Resp 18   SpO2 96%   Visual Acuity Right Eye Distance:   Left Eye Distance:   Bilateral Distance:    Right Eye Near:   Left Eye Near:    Bilateral Near:     Physical Exam Constitutional:      Appearance: Normal appearance.  Cardiovascular:     Rate and Rhythm: Normal rate and regular rhythm.  Pulmonary:     Effort: Pulmonary effort is normal.     Breath sounds: Normal breath sounds.  Musculoskeletal:       Arms:  Skin:    General: Skin is warm.     Capillary Refill: Capillary refill takes less than 2 seconds.  Neurological:     Mental Status: She is alert.     GCS: GCS eye subscore is 4. GCS verbal subscore is 5. GCS motor subscore is 6.     Cranial Nerves: Cranial nerves are intact.     Motor: Motor function is intact.     Coordination: Coordination is intact.     UC Treatments / Results  Labs (all labs ordered are listed, but only abnormal results are displayed) Labs Reviewed - No data to  display  EKG   Radiology No results found.  Procedures Procedures (including critical care time)  Medications Ordered in UC Medications - No data to display  Initial Impression / Assessment and Plan / UC Course  I have reviewed the triage vital signs and the nursing notes.  Pertinent labs & imaging results that were available during my care of the patient were reviewed by me and considered in my medical decision making (see chart for details).     Bicep muscle strain.  Treatment with 2 days of aggressive anti-inflammatory treatment with indomethacin 50 mg twice daily for 7 days.  Also encouraged heat applications and massages to reduce inflammation.  Return precautions if symptoms worsen or do not improve. Final Clinical Impressions(s) / UC Diagnoses   Final diagnoses:  Biceps muscle strain, right, initial encounter   Discharge Instructions   None    ED Prescriptions     Medication Sig Dispense Auth. Provider   indomethacin (INDOCIN) 50 MG capsule Take 1 capsule (50 mg total) by mouth 2 (two) times daily with a meal for 7 days. 14 capsule Bing Neighbors, FNP      PDMP not reviewed this encounter.   Bing Neighbors, [place] 04/26/21 [phone]

## 2021-04-26 NOTE — ED Triage Notes (Signed)
Pt is present today with right arm pain. 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 Pt states that she noticed the pain x1 week ago. Pt  denies any injury

## 2021-04-27 ENCOUNTER — Telehealth: Payer: Self-pay | Admitting: *Deleted

## 2021-04-27 DIAGNOSIS — G4489 Other headache syndrome: Secondary | ICD-10-CM

## 2021-04-27 DIAGNOSIS — R7982 Elevated C-reactive protein (CRP): Secondary | ICD-10-CM

## 2021-04-27 NOTE — Telephone Encounter (Signed)
-----   Message from [name], NP sent at 04/26/2021  8:49 AM EDT ----- CRP is elevated but SED rate is normal. Please ask the patient if the sharp shooting pains have subsided since she restarted ajovy. The CRP being elevated doesn't mean that its temporal arteritis but if she is still having discomfort we may need to start medication.

## 2021-04-27 NOTE — Telephone Encounter (Signed)
I called pt and gave her results of labs.  She continues with sharp pains with no resolve from ajovy injection.  She verbalized understanding. Will see what other medication MM/NP had in mind then let her know.  I spoke to her about ajovy, She states she had tried amitriptyline and topamax previously. Will redo PA.

## 2021-04-28 NOTE — Telephone Encounter (Signed)
Discussed with Dr. Daisy Blossom.  We can do a steroid Dosepak.  It is unlikely that the patient has temporal arteritis.  Can recheck lab work in a couple weeks.  I do see that the patient is on indomethacin can you asked the patient why this was started?  If it is for her current headaches then we will hold off on the Dosepak

## 2021-04-29 NOTE — Telephone Encounter (Signed)
I called the patient.  We discussed the message from [place].  Patient verbalized understanding.  She did state that she is taking the indomethacin for a pulled arm muscle.  It is a 7-day course and she is taking 50 mg twice daily.  I let her know I would run this by Aundra Millet NP to clarify if we would start her on the steroid dose pack and call her back. Pt scheduled for lab appt on 05/10/21 at 2 pm. She understands lab hours and that she is not tied to 2 pm.

## 2021-04-29 NOTE — Telephone Encounter (Signed)
Spoke to pt and she stated she had tried amitriptyline and topiramate previously (years ago).  I relayed will reattempt to do PA for ajovy.  Initiated another PA with updated inforamtion Key: [id number] .  24-72 hours for determination.

## 2021-04-29 NOTE — Addendum Note (Signed)
Addended by: Bertram Savin on: 04/29/2021 09:56 AM   Modules accepted: Orders

## 2021-04-29 NOTE — Telephone Encounter (Signed)
Spoke with the patient.  Discussed the message from [facility] NP that since Indomethacin may help her headaches, we can reevaluate when she finishes the course. So far pt has not noticed any improvement and headache is worse today. Patient is due to finish indomethacin on Monday 05/03/21.  She also asked about the Ajovy PA which is still under review by [facility]. I suggested the patient give [place] a call back Tuesday morning 8/16 to check status of PA and give [place] an update on how she is once the indomethacin has completely finished. Patient aware that she is welcome to give [place] a call sooner if needed and if she worsens over the weekend to please go to the hospital or urgent care. She verbalized appreciation and understanding.

## 2021-05-03 NOTE — Telephone Encounter (Addendum)
I called pt and she stated she has had migraines the last 2 days. Would like to try the prednisone.  She is not diabetic.

## 2021-05-03 NOTE — Telephone Encounter (Signed)
Can we call and check and see if the patient's headaches have gotten any better.  If not then we can start a prednisone Dosepak

## 2021-05-04 MED ORDER — PREDNISONE 5 MG PO TABS: 5.0000 mg | ORAL_TABLET | Freq: Every day | ORAL | 0 refills | Status: DC

## 2021-05-04 NOTE — Addendum Note (Signed)
Addended by: Enedina Finner on: 05/04/2021 10:29 AM   Modules accepted: Orders

## 2021-05-04 NOTE — Telephone Encounter (Signed)
Order sent.

## 2021-05-04 NOTE — Telephone Encounter (Signed)
Appeal letter sent. See Arnetha Massy PA telephone encounter.

## 2021-05-04 NOTE — Telephone Encounter (Signed)
Spoke with patient and let her know Megan NP sent a prescription for prednisone 5 mg tablets to her pharmacy.  Total of 21 tablets taken over a 6-day course by mouth with breakfast. Begin taking 6 tablets daily, taper by one tablet daily until off the medication. Patient verbalized understanding and appreciation for the call.

## 2021-05-04 NOTE — Telephone Encounter (Signed)
Faxed appeal letter to [facility] with recent OV note and denial letter to [phone].

## 2021-05-06 NOTE — Telephone Encounter (Signed)
Received fax from [facility], requesting additional information. I completed form and faxed back to [phone].

## 2021-05-10 ENCOUNTER — Other Ambulatory Visit: Payer: Self-pay | Admitting: Neurology

## 2021-05-10 ENCOUNTER — Other Ambulatory Visit (INDEPENDENT_AMBULATORY_CARE_PROVIDER_SITE_OTHER): Payer: Self-pay

## 2021-05-10 DIAGNOSIS — R7982 Elevated C-reactive protein (CRP): Secondary | ICD-10-CM | POA: Diagnosis not present

## 2021-05-10 DIAGNOSIS — G4489 Other headache syndrome: Secondary | ICD-10-CM

## 2021-05-10 DIAGNOSIS — Z0289 Encounter for other administrative examinations: Secondary | ICD-10-CM

## 2021-05-10 NOTE — Telephone Encounter (Signed)
Pt states the pharmacy just informed her that they need a new script for pt's Ajovy 225 mg

## 2021-05-10 NOTE — Telephone Encounter (Signed)
Refill sent to pharmacy.   

## 2021-05-10 NOTE — Telephone Encounter (Addendum)
Received fax from [facility]. Ajovy 225 mg has been approved by [employer] 11/25/20-11/06/21. Appeal has been overturned for 6 months. Case number: [id number].   Notified pharmacy via fax. Received a receipt of confirmation. Called pt and advised her of approval.

## 2021-05-11 LAB — C-REACTIVE PROTEIN: CRP: 15 mg/L — ABNORMAL HIGH (ref 0–10)

## 2021-05-11 LAB — SEDIMENTATION RATE: Sed Rate: 2 mm/hr (ref 0–32)

## 2021-05-12 ENCOUNTER — Telehealth: Payer: Self-pay | Admitting: Adult Health

## 2021-05-12 MED ORDER — GABAPENTIN 100 MG PO CAPS: 100.0000 mg | ORAL_CAPSULE | Freq: Two times a day (BID) | ORAL | 3 refills | Status: DC

## 2021-05-12 NOTE — Telephone Encounter (Signed)
-----   Message from Guy Begin, RN sent at 05/11/2021  3:38 PM EDT ----- Clent Demark the results of repeat CRP.  Pt still with headache, no change.

## 2021-05-12 NOTE — Telephone Encounter (Signed)
I called the patient.  She is still having episodes of sharp shooting pains in the temporal regions.  These usually happen daily only last for seconds.  The only other symptom she gets with that is slightly blurry vision.  The patient was on indomethacin with no benefit.  She also did a prednisone Dosepak with no benefit.  We will try gabapentin 100 mg twice a day to see if this offers her any benefit.  Advised to give it 1 to 2 weeks if no benefit she should let us know.   Discussed the case with Dr. 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  Also no improvement on prednisone

## 2021-05-14 ENCOUNTER — Other Ambulatory Visit: Payer: Self-pay | Admitting: Adult Health

## 2021-05-26 ENCOUNTER — Ambulatory Visit
Admission: EM | Admit: 2021-05-26 | Discharge: 2021-05-26 | Disposition: A | Discharge status: Home / Self Care | Payer: BC Managed Care – PPO | Attending: Family Medicine | Admitting: Family Medicine

## 2021-05-26 ENCOUNTER — Other Ambulatory Visit: Payer: Self-pay

## 2021-05-26 ENCOUNTER — Encounter: Payer: Self-pay | Admitting: Emergency Medicine

## 2021-05-26 DIAGNOSIS — M7711 Lateral epicondylitis, right elbow: Secondary | ICD-10-CM

## 2021-05-26 MED ORDER — PREDNISONE 20 MG PO TABS: 40.0000 mg | ORAL_TABLET | Freq: Every day | ORAL | 0 refills | Status: DC

## 2021-05-26 NOTE — ED Triage Notes (Signed)
Pain to RT upper arm that has moved into her elbow.  Seen here x 1 month ago for same and it has not gotten better.  Pt reports she lifts heavy items frequently at work.

## 2021-05-31 NOTE — ED Provider Notes (Signed)
[facility]   [id number] 05/26/21 Arrival Time: 1423  ASSESSMENT & PLAN:  1. Lateral epicondylitis of right elbow    No indication for elbow imaging. Work note provided.  Begin trial of: Meds ordered this encounter  Medications   predniSONE (DELTASONE) 20 MG tablet    Sig: Take 2 tablets (40 mg total) by mouth daily.    Dispense:  10 tablet    Refill:  0   Will buy arm brace at drug store. May f/u here as needed.  Reviewed expectations re: course of current medical issues. Questions answered. Outlined signs and symptoms indicating need for more acute intervention. Patient verbalized understanding. After Visit Summary given.  SUBJECTIVE: History from: patient. Tonya Love is a 45 y.o. female who reports L elbow pain; over past month; lifts heavy items repeatedly at work and ques relation. No trauma. No extremity sensation changes or weakness. No tx PTA.  Past Surgical History:  Procedure Laterality Date   BIOPSY  11/06/2019   Procedure: BIOPSY;  Surgeon: Corbin Ade, MD;  [facility];  Service: Endoscopy;;   CHOLECYSTECTOMY     COLONOSCOPY N/A 11/06/2019   Procedure: COLONOSCOPY;  Surgeon: Corbin Ade, diverticulosis in the sigmoid colon, minimal grade 2 hemorrhoids, otherwise normal.  Segmental biopsies benign.  Repeat colonoscopy in 10 years.   ESOPHAGOGASTRODUODENOSCOPY N/A 11/06/2019   Procedure: ESOPHAGOGASTRODUODENOSCOPY (EGD);  Surgeon: Corbin Ade, MD; mild Schatzki's ring s/p dilation, abnormal distal esophageal mucosa with benign biopsies, small hiatal hernia, otherwise normal.   MALONEY DILATION N/A 11/06/2019   Procedure: Elease Hashimoto DILATION;  Surgeon: Corbin Ade, MD;  [facility];  Service: Endoscopy;  Laterality: N/A;   TUBAL LIGATION        OBJECTIVE:  Vitals:   05/26/21 1432  BP: 123/82  Pulse: (!) 109  Resp: 18  Temp: 99 F (37.2 C)  TempSrc: Oral  SpO2: 96%    General appearance: alert; no  distress HEENT: [place]; AT Neck: supple with FROM Resp: unlabored respirations Extremities: RUE: warm with well perfused appearance; TTP over lateral epicondyle and distal at tendon insertion; swelling/bruising: none; elbow ROM: normal CV: brisk extremity capillary refill of RUE; 2+ radial pulse of RUE. Skin: warm and dry; no visible rashes Neurologic: gait normal; normal sensation and strength of RUE Psychological: alert and cooperative; normal mood and affect   Allergies  Allergen Reactions   Codeine Nausea Only    Past Medical History:  Diagnosis Date   Anxiety    Depression    GERD (gastroesophageal reflux disease)    Migraines    Pancreatitis    Social History   Socioeconomic History   Marital status: Legally Separated    Spouse name: Not on file   Number of children: Not on file   Years of education: Not on file   Highest education level: Not on file  Occupational History   Not on file  Tobacco Use   Smoking status: Never   Smokeless tobacco: Never  Vaping Use   Vaping Use: Never used  Substance and Sexual Activity   Alcohol use: No   Drug use: No   Sexual activity: Yes    Birth control/protection: Pill  Other Topics Concern   Not on file  Social History Narrative   Not on file   Social Determinants of Health   Financial Resource Strain: Not on file  Food Insecurity: Not on file  Transportation Needs: Not on file  Physical Activity: Not on  file  Stress: Not on file  Social Connections: Not on file   Family History  Problem Relation Age of Onset   Depression Maternal Aunt    Alcohol abuse Father    Depression Paternal Aunt    Alcohol abuse Paternal Grandfather    Colon cancer Neg Hx    Past Surgical History:  Procedure Laterality Date   BIOPSY  11/06/2019   Procedure: BIOPSY;  Surgeon: Corbin Ade, MD;  [facility];  Service: Endoscopy;;   CHOLECYSTECTOMY     COLONOSCOPY N/A 11/06/2019   Procedure: COLONOSCOPY;  Surgeon: Corbin Ade, diverticulosis in the sigmoid colon, minimal grade 2 hemorrhoids, otherwise normal.  Segmental biopsies benign.  Repeat colonoscopy in 10 years.   ESOPHAGOGASTRODUODENOSCOPY N/A 11/06/2019   Procedure: ESOPHAGOGASTRODUODENOSCOPY (EGD);  Surgeon: Corbin Ade, MD; mild Schatzki's ring s/p dilation, abnormal distal esophageal mucosa with benign biopsies, small hiatal hernia, otherwise normal.   MALONEY DILATION N/A 11/06/2019   Procedure: Elease Hashimoto DILATION;  Surgeon: Corbin Ade, MD;  [facility];  Service: Endoscopy;  Laterality: N/A;   TUBAL LIGATION         Mardella Layman, MD 05/31/21 [phone]

## 2021-06-01 ENCOUNTER — Other Ambulatory Visit: Payer: Self-pay | Admitting: Family Medicine

## 2021-06-08 ENCOUNTER — Telehealth: Payer: BC Managed Care – PPO | Admitting: Adult Health

## 2021-06-30 ENCOUNTER — Other Ambulatory Visit: Payer: Self-pay | Admitting: Gastroenterology

## 2021-06-30 ENCOUNTER — Other Ambulatory Visit (HOSPITAL_COMMUNITY)
Admission: RE | Admit: 2021-06-30 | Discharge: 2021-06-30 | Disposition: A | Discharge status: Home / Self Care | Payer: BC Managed Care – PPO | Source: Ambulatory Visit | Attending: Cardiology | Admitting: Cardiology

## 2021-06-30 ENCOUNTER — Other Ambulatory Visit: Payer: Self-pay | Admitting: Cardiology

## 2021-06-30 ENCOUNTER — Encounter: Payer: Self-pay | Admitting: Cardiology

## 2021-06-30 ENCOUNTER — Ambulatory Visit (INDEPENDENT_AMBULATORY_CARE_PROVIDER_SITE_OTHER): Payer: BC Managed Care – PPO

## 2021-06-30 ENCOUNTER — Ambulatory Visit (INDEPENDENT_AMBULATORY_CARE_PROVIDER_SITE_OTHER): Payer: BC Managed Care – PPO | Admitting: Cardiology

## 2021-06-30 ENCOUNTER — Other Ambulatory Visit: Payer: Self-pay

## 2021-06-30 VITALS — BP 122/76 | HR 81 | Ht 62.0 in | Wt 193.0 lb

## 2021-06-30 DIAGNOSIS — G4730 Sleep apnea, unspecified: Secondary | ICD-10-CM | POA: Diagnosis not present

## 2021-06-30 DIAGNOSIS — R079 Chest pain, unspecified: Secondary | ICD-10-CM

## 2021-06-30 DIAGNOSIS — R002 Palpitations: Secondary | ICD-10-CM

## 2021-06-30 DIAGNOSIS — R197 Diarrhea, unspecified: Secondary | ICD-10-CM

## 2021-06-30 LAB — LIPID PANEL
Cholesterol: 178 mg/dL (ref 0–200)
HDL: 43 mg/dL (ref >40)
LDL Cholesterol: 97 mg/dL (ref 0–99)
Total CHOL/HDL Ratio: 4.1 RATIO
Triglycerides: 190 mg/dL — ABNORMAL HIGH (ref <150)
VLDL: 38 mg/dL (ref 0–40)

## 2021-06-30 LAB — BASIC METABOLIC PANEL
Anion gap: 5 (ref 5–15)
BUN: 16 mg/dL (ref 6–20)
CO2: 28 mmol/L (ref 22–32)
Calcium: 8.7 mg/dL — ABNORMAL LOW (ref 8.9–10.3)
Chloride: 107 mmol/L (ref 98–111)
Creatinine, Ser: 0.97 mg/dL (ref 0.44–1.00)
GFR, Estimated: >60 mL/min (ref >60)
Glucose, Bld: 103 mg/dL — ABNORMAL HIGH (ref 70–99)
Potassium: 4 mmol/L (ref 3.5–5.1)
Sodium: 140 mmol/L (ref 135–145)

## 2021-06-30 LAB — CBC
HCT: 37.3 % (ref 36.0–46.0)
Hemoglobin: 12 g/dL (ref 12.0–15.0)
MCH: 31.8 pg (ref 26.0–34.0)
MCHC: 32.2 g/dL (ref 30.0–36.0)
MCV: 98.9 fL (ref 80.0–100.0)
Platelets: 407 10*3/uL — ABNORMAL HIGH (ref 150–400)
RBC: 3.77 MIL/uL — ABNORMAL LOW (ref 3.87–5.11)
RDW: 13 % (ref 11.5–15.5)
WBC: 12.9 10*3/uL — ABNORMAL HIGH (ref 4.0–10.5)
nRBC: 0 % (ref 0.0–0.2)

## 2021-06-30 LAB — TSH: TSH: 0.815 u[IU]/mL (ref 0.350–4.500)

## 2021-06-30 MED ORDER — METOPROLOL TARTRATE 100 MG PO TABS: ORAL_TABLET | ORAL | 0 refills | Status: DC

## 2021-06-30 NOTE — Addendum Note (Signed)
Addended by: Marlyn Corporal A on: 06/30/2021 02:48 PM   Modules accepted: Orders

## 2021-06-30 NOTE — Addendum Note (Signed)
Addended by: Marlyn Corporal A on: 06/30/2021 02:32 PM   Modules accepted: Orders

## 2021-06-30 NOTE — Patient Instructions (Addendum)
Medication Instructions:   Your physician recommends that you continue on your current medications as directed. Please refer to the Current Medication list given to you today.  *If you need a refill on your cardiac medications before your next appointment, please call your pharmacy*   Lab Work:  BMET,cbc,tsh, lipids  If you have labs (blood work) drawn today and your tests are completely normal, you will receive your results only by: MyChart Message (if you have MyChart) OR A paper copy in the mail If you have any lab test that is abnormal or we need to change your treatment, we will call you to review the results.   Testing/Procedures: Your physician has requested that you have cardiac CT. Cardiac computed tomography (CT) is a painless test that uses an x-ray machine to take clear, detailed pictures of your heart. For further information please visit [email or web address] Please follow instruction sheet as given.     ZIO XT- Long Term Monitor Instructions   Your physician has requested you wear your ZIO patch monitor___14____days.   This is a single patch monitor.  [employer] supplies one patch monitor per enrollment.  Additional stickers are not available.   Please do not apply patch if you will be having a Nuclear Stress Test, Echocardiogram, Cardiac CT, MRI, or Chest Xray during the time frame you would be wearing the monitor. The patch cannot be worn during these tests.  You cannot remove and re-apply the ZIO XT patch monitor.   Your ZIO patch monitor will be sent USPS Priority mail from [facility] directly to your home address. The monitor may also be mailed to a PO BOX if home delivery is not available.   It may take 3-5 days to receive your monitor after you have been enrolled.   Once you have received you monitor, please review enclosed instructions.  Your monitor has already been registered assigning a specific monitor serial # to you.   Applying the monitor    Shave hair from upper left chest.   Hold abrader disc by orange tab.  Rub abrader in 40 strokes over left upper chest as indicated in your monitor instructions.   Clean area with 4 enclosed alcohol pads .  Use all pads to assure are is cleaned thoroughly.  Let dry.   Apply patch as indicated in monitor instructions.  Patch will be place under collarbone on left side of chest with arrow pointing upward.   Rub patch adhesive wings for 2 minutes.Remove white label marked "1".  Remove white label marked "2".  Rub patch adhesive wings for 2 additional minutes.   While looking in a mirror, press and release button in center of patch.  A small green light will flash 3-4 times .  This will be your only indicator the monitor has been turned on.     Do not shower for the first 24 hours.  You may shower after the first 24 hours.   Press button if you feel a symptom. You will hear a small click.  Record Date, Time and Symptom in the Patient Log Book.   When you are ready to remove patch, follow instructions on last 2 pages of Patient Log Book.  Stick patch monitor onto last page of Patient Log Book.   Place Patient Log Book in [place] box.  Use locking tab on box and tape box closed securely.  The [employer] and [employer] has [employer] on it.  Please place in mailbox as soon as possible.  Your physician should have your test results approximately 7 days after the monitor has been mailed back to [facility].   Call [facility] Customer Care at [phone] if you have questions regarding your ZIO XT patch monitor.  Call them immediately if you see an orange light blinking on your monitor.   If your monitor falls off in less than 4 days contact our Monitor department at [phone].  If your monitor becomes loose or falls off after 4 days call [employer] at [phone] for suggestions on securing your monitor.     Follow-Up: At [facility], you and your health needs are our priority.  As  part of our continuing mission to provide you with exceptional heart care, we have created designated Provider Care Teams.  These Care Teams include your primary Cardiologist (physician) and Advanced Practice Providers (APPs -  Physician Assistants and Nurse Practitioners) who all work together to provide you with the care you need, when you need it.  We recommend signing up for the patient portal called "MyChart".  Sign up information is provided on this After Visit Summary.  MyChart is used to connect with patients for Virtual Visits (Telemedicine).  Patients are able to view lab/test results, encounter notes, upcoming appointments, etc.  Non-urgent messages can be sent to your provider as well.   To learn more about what you can do with MyChart, go to [email or web address].    Your next appointment:   3 month(s)  The format for your next appointment:   In Person  Provider:   Dr.Schumann    Other Instructions You have been referred to pulmonary for evaluation of sleep apnea.

## 2021-06-30 NOTE — Progress Notes (Signed)
Cardiology Office Note:    Date:  06/30/2021   ID:  Tonya Love, DOB 10/05/1975, MRN [id number]  PCP:  Pcp, No  Cardiologist:  None  Electrophysiologist:  None   Referring MD: No ref. provider found   Chief Complaint  Patient presents with   Palpitations    History of Present Illness:    Tonya Love is a 45 y.o. female with a hx of anxiety, depression, pancreatitis who presents as a follow-up from recent urgent care visit for palpitations.  She reports she has been having palpitations which she describes as feeling her heart will race and then go slow.  Lasts for couple minutes, can occur throughout the day.  Also reports feeling short of breath with exertion.  In addition reports occasional chest pains.  Describes as sharp pain left-sided pain.  Can occur at rest.  However does report can be brought on with exertion.  She walks 1-2 times per week for 3 miles and will sometimes have chest pain when walking.  She has been told she seems to stop breathing when she sleeps.  States that she feels tired throughout the day.  Has been told she snores.  No smoking history.  Family history includes paternal uncle died of MI at 75.   Past Medical History:  Diagnosis Date   Anxiety    Depression    GERD (gastroesophageal reflux disease)    Migraines    Pancreatitis     Past Surgical History:  Procedure Laterality Date   BIOPSY  11/06/2019   Procedure: BIOPSY;  Surgeon: Corbin Ade, MD;  [facility];  Service: Endoscopy;;   CHOLECYSTECTOMY     COLONOSCOPY N/A 11/06/2019   Procedure: COLONOSCOPY;  Surgeon: Corbin Ade, diverticulosis in the sigmoid colon, minimal grade 2 hemorrhoids, otherwise normal.  Segmental biopsies benign.  Repeat colonoscopy in 10 years.   ESOPHAGOGASTRODUODENOSCOPY N/A 11/06/2019   Procedure: ESOPHAGOGASTRODUODENOSCOPY (EGD);  Surgeon: Corbin Ade, MD; mild Schatzki's ring s/p dilation, abnormal distal esophageal mucosa with benign  biopsies, small hiatal hernia, otherwise normal.   MALONEY DILATION N/A 11/06/2019   Procedure: Elease Hashimoto DILATION;  Surgeon: Corbin Ade, MD;  [facility];  Service: Endoscopy;  Laterality: N/A;   TUBAL LIGATION      Current Medications: Current Meds  Medication Sig   AJOVY 225 MG/1.5ML SOAJ INJECT 225 MG INTO THE SKIN EVERY 30 DAYS   cholestyramine (QUESTRAN) 4 g packet TAKE 1 PACKET MIXED WITH 4 OUNCES OF WATER EVERY DAY WITH BREAKFAST TAKE OTHER MEDS 1 HOUR BEFORE OR 4 TO 6 HOURS   gabapentin (NEURONTIN) 100 MG capsule Take 1 capsule (100 mg total) by mouth 2 (two) times daily.   ibuprofen (ADVIL,MOTRIN) 200 MG tablet Take 600 mg by mouth every 6 (six) hours as needed for headache.   ipratropium (ATROVENT) 0.03 % nasal spray Place 2 sprays into both nostrils 2 (two) times daily as needed for rhinitis.   metoprolol tartrate (LOPRESSOR) 100 MG tablet Take 100 mg 2 hours before cardiac ct   ondansetron (ZOFRAN) 4 MG tablet Take 1 tablet (4 mg total) by mouth every 8 (eight) hours as needed for nausea or vomiting.   pantoprazole (PROTONIX) 40 MG tablet TAKE 1 TABLET(40 MG) BY MOUTH TWICE DAILY   rizatriptan (MAXALT-MLT) 10 MG disintegrating tablet Take 1 tablet (10 mg total) by mouth as needed for migraine. May repeat in 2 hours if needed, no more than 2 pills in 24 hours.  Current Facility-Administered Medications for the 06/30/21 encounter (Office Visit) with Little Ishikawa, MD  Medication   ipratropium (ATROVENT) 0.03 % nasal spray 2 spray     Allergies:   Codeine   Social History   Socioeconomic History   Marital status: Legally Separated    Spouse name: Not on file   Number of children: Not on file   Years of education: Not on file   Highest education level: Not on file  Occupational History   Not on file  Tobacco Use   Smoking status: Never   Smokeless tobacco: Never  Vaping Use   Vaping Use: Never used  Substance and Sexual Activity   Alcohol  use: No   Drug use: No   Sexual activity: Yes    Birth control/protection: Pill  Other Topics Concern   Not on file  Social History Narrative   Not on file   Social Determinants of Health   Financial Resource Strain: Not on file  Food Insecurity: Not on file  Transportation Needs: Not on file  Physical Activity: Not on file  Stress: Not on file  Social Connections: Not on file     Family History: The patient's family history includes Alcohol abuse in her father and paternal grandfather; Depression in her maternal aunt and paternal aunt. There is no history of Colon cancer.  ROS:   Please see the history of present illness.     All other systems reviewed and are negative.  EKGs/Labs/Other Studies Reviewed:    The following studies were reviewed today:   EKG:  EKG is  ordered today.  The ekg ordered today demonstrates normal sinus rhythm, rate 81, no ST abnormalities  Recent Labs: 03/01/2021: Hemoglobin 11.4; Platelets 368  Recent Lipid Panel No results found for: CHOL, TRIG, HDL, CHOLHDL, VLDL, LDLCALC, LDLDIRECT  Physical Exam:    VS:  BP 122/76   Pulse 81   Ht 5\' 2"  (1.575 m)   Wt 193 lb (87.5 kg)   SpO2 97%   BMI 35.30 kg/m     Wt Readings from Last 3 Encounters:  06/30/21 193 lb (87.5 kg)  04/20/21 185 lb (83.9 kg)  03/01/21 183 lb 3.2 oz (83.1 kg)     GEN:  Well nourished, well developed in no acute distress HEENT: Normal NECK: No JVD; No carotid bruits LYMPHATICS: No lymphadenopathy CARDIAC: RRR, no murmurs, rubs, gallops RESPIRATORY:  Clear to auscultation without rales, wheezing or rhonchi  ABDOMEN: Soft, non-tender, non-distended MUSCULOSKELETAL:  No edema; No deformity  SKIN: Warm and dry NEUROLOGIC:  Alert and oriented x 3 PSYCHIATRIC:  Normal affect   ASSESSMENT:    1. Palpitations   2. Chest pain of uncertain etiology   3. Observed sleep apnea    PLAN:    Palpitations: Description concerning for arrhythmia, will evaluate with Zio  patch x7 days  Chest pain: Atypical in description but does report can be brought on by exertion.  Recommend coronary CTA to exclude obstructive CAD.  We will give Lopressor 100 mg prior to study.  Observed apnea/daytime somnolence/snoring: Recommend sleep study  RTC in 3 months  Medication Adjustments/Labs and Tests Ordered: Current medicines are reviewed at length with the patient today.  Concerns regarding medicines are outlined above.  Orders Placed This Encounter  Procedures   Basic metabolic panel   Ambulatory referral to Pulmonology   EKG 12-Lead   Meds ordered this encounter  Medications   metoprolol tartrate (LOPRESSOR) 100 MG tablet    Sig:  Take 100 mg 2 hours before cardiac ct    Dispense:  1 tablet    Refill:  0    Patient Instructions  Medication Instructions:   Your physician recommends that you continue on your current medications as directed. Please refer to the Current Medication list given to you today.  *If you need a refill on your cardiac medications before your next appointment, please call your pharmacy*   Lab Work:  BMET  If you have labs (blood work) drawn today and your tests are completely normal, you will receive your results only by: MyChart Message (if you have MyChart) OR A paper copy in the mail If you have any lab test that is abnormal or we need to change your treatment, we will call you to review the results.   Testing/Procedures: Your physician has requested that you have cardiac CT. Cardiac computed tomography (CT) is a painless test that uses an x-ray machine to take clear, detailed pictures of your heart. For further information please visit [email or web address] Please follow instruction sheet as given.     ZIO XT- Long Term Monitor Instructions   Your physician has requested you wear your ZIO patch monitor___14____days.   This is a single patch monitor.  [employer] supplies one patch monitor per enrollment.  Additional stickers  are not available.   Please do not apply patch if you will be having a Nuclear Stress Test, Echocardiogram, Cardiac CT, MRI, or Chest Xray during the time frame you would be wearing the monitor. The patch cannot be worn during these tests.  You cannot remove and re-apply the ZIO XT patch monitor.   Your ZIO patch monitor will be sent USPS Priority mail from [facility] directly to your home address. The monitor may also be mailed to a PO BOX if home delivery is not available.   It may take 3-5 days to receive your monitor after you have been enrolled.   Once you have received you monitor, please review enclosed instructions.  Your monitor has already been registered assigning a specific monitor serial # to you.   Applying the monitor   Shave hair from upper left chest.   Hold abrader disc by orange tab.  Rub abrader in 40 strokes over left upper chest as indicated in your monitor instructions.   Clean area with 4 enclosed alcohol pads .  Use all pads to assure are is cleaned thoroughly.  Let dry.   Apply patch as indicated in monitor instructions.  Patch will be place under collarbone on left side of chest with arrow pointing upward.   Rub patch adhesive wings for 2 minutes.Remove white label marked "1".  Remove white label marked "2".  Rub patch adhesive wings for 2 additional minutes.   While looking in a mirror, press and release button in center of patch.  A small green light will flash 3-4 times .  This will be your only indicator the monitor has been turned on.     Do not shower for the first 24 hours.  You may shower after the first 24 hours.   Press button if you feel a symptom. You will hear a small click.  Record Date, Time and Symptom in the Patient Log Book.   When you are ready to remove patch, follow instructions on last 2 pages of Patient Log Book.  Stick patch monitor onto last page of Patient Log Book.   Place Patient Log Book in [place] box.  Use locking tab on  box  and tape box closed securely.  The [employer] and [employer] has [employer] on it.  Please place in mailbox as soon as possible.  Your physician should have your test results approximately 7 days after the monitor has been mailed back to [facility].   Call [facility] Customer Care at [phone] if you have questions regarding your ZIO XT patch monitor.  Call them immediately if you see an orange light blinking on your monitor.   If your monitor falls off in less than 4 days contact our Monitor department at [phone].  If your monitor becomes loose or falls off after 4 days call [employer] at [phone] for suggestions on securing your monitor.     Follow-Up: At [facility], you and your health needs are our priority.  As part of our continuing mission to provide you with exceptional heart care, we have created designated Provider Care Teams.  These Care Teams include your primary Cardiologist (physician) and Advanced Practice Providers (APPs -  Physician Assistants and Nurse Practitioners) who all work together to provide you with the care you need, when you need it.  We recommend signing up for the patient portal called "MyChart".  Sign up information is provided on this After Visit Summary.  MyChart is used to connect with patients for Virtual Visits (Telemedicine).  Patients are able to view lab/test results, encounter notes, upcoming appointments, etc.  Non-urgent messages can be sent to your provider as well.   To learn more about what you can do with MyChart, go to [email or web address].    Your next appointment:   3 month(s)  The format for your next appointment:   In Person  Provider:   Dr.Meiya Wisler    Other Instructions You have been referred to pulmonary for evaluation of sleep apnea.   Signed, Little Ishikawa, MD  06/30/2021 2:18 PM    [facility]

## 2021-07-23 ENCOUNTER — Other Ambulatory Visit: Payer: Self-pay

## 2021-07-23 ENCOUNTER — Ambulatory Visit (INDEPENDENT_AMBULATORY_CARE_PROVIDER_SITE_OTHER): Payer: BC Managed Care – PPO | Admitting: Pulmonary Disease

## 2021-07-23 ENCOUNTER — Encounter: Payer: Self-pay | Admitting: Pulmonary Disease

## 2021-07-23 VITALS — BP 116/74 | HR 87 | Ht 62.0 in | Wt 195.0 lb

## 2021-07-23 DIAGNOSIS — R0683 Snoring: Secondary | ICD-10-CM | POA: Diagnosis not present

## 2021-07-23 NOTE — Patient Instructions (Signed)
Will arrange for home sleep study Will call to arrange for follow up after sleep study reviewed  

## 2021-07-23 NOTE — Progress Notes (Signed)
[facility]  Chief Complaint  Patient presents with   Consult    Referred by Dr. Bjorn Pippin for possible OSA. Denies ever having a sleep study before. States she will wake up gasping for air at night.     Past Surgical History:  She  has a past surgical history that includes Cholecystectomy; Tubal ligation; Colonoscopy (N/A, 11/06/2019); Esophagogastroduodenoscopy (N/A, 11/06/2019); maloney dilation (N/A, 11/06/2019); and biopsy (11/06/2019).  Past Medical History:  Anxiety, Depression, GERD, Migraine headaches, Pancreatitis  Constitutional:  BP 116/74   Pulse 87   Ht 5\' 2"  (1.575 m)   Wt 195 lb (88.5 kg)   SpO2 99%   BMI 35.67 kg/m   Brief Summary:  Tonya Love is a 45 y.o. female with snoring.      Subjective:   She was seen by cardiology recently for palpitations.  She has noticed these at night.  This is associated with snoring and periods of apnea.  She has trouble staying awake when watching TV.    She goes to sleep at 10 to 11 pm.  She falls asleep in about 30 minutes.  She wakes up 1 or 2 times to use the bathroom.  She gets out of bed at 6 am.  She feels tired in the morning.  She gets frequent headaches.  She does not use anything to help her fall sleep or stay awake.  She denies sleep walking, sleep talking, bruxism, or nightmares.  There is no history of restless legs.  She denies sleep hallucinations, sleep paralysis, or cataplexy.  The Epworth score is 15 out of 24.   Physical Exam:   Appearance - well kempt   ENMT - no sinus tenderness, no oral exudate, no LAN, Mallampati 3 airway, no stridor  Respiratory - equal breath sounds bilaterally, no wheezing or rales  CV - s1s2 regular rate and rhythm, no murmurs  Ext - no clubbing, no edema  Skin - no rashes  Psych - normal mood and affect   Pulmonary testing:    Chest Imaging:    Sleep Tests:    Cardiac Tests:    Social History:  She  reports  that she has never smoked. She has never used smokeless tobacco. She reports that she does not drink alcohol and does not use drugs.  Family History:  Her family history includes Alcohol abuse in her father and paternal grandfather; Depression in her maternal aunt and paternal aunt.    Discussion:  She has snoring, sleep disruption, apnea, and daytime sleepiness.  She has history of depression and anxiety.  Her BMI is > 35.  I am concerned she could have obstructive sleep apnea.  Assessment/Plan:   Snoring with excessive daytime sleepiness. - will need to arrange for a home sleep study  Migraine headaches. - followed by [facility]  Obesity. - discussed how weight can impact sleep and risk for sleep disordered breathing - discussed options to assist with weight loss: combination of diet modification, cardiovascular and strength training exercises  Cardiovascular risk. - had an extensive discussion regarding the adverse health consequences related to untreated sleep disordered breathing - specifically discussed the risks for hypertension, coronary artery disease, cardiac dysrhythmias, cerebrovascular disease, and diabetes - lifestyle modification discussed  Safe driving practices. - discussed how sleep disruption can increase risk of accidents, particularly when driving - safe driving practices were discussed  Therapies for obstructive sleep apnea. - if the sleep study shows significant sleep apnea, then  various therapies for treatment were reviewed: CPAP, oral appliance, and surgical interventions  Time Spent Involved in Patient Care on Day of Examination:  32 minutes  Follow up:   Patient Instructions  Will arrange for home sleep study Will call to arrange for follow up after sleep study reviewed  Medication List:   Allergies as of 07/23/2021       Reactions   Codeine Nausea Only        Medication List        Accurate as of July 23, 2021  3:11 PM. If  you have any questions, ask your nurse or doctor.          Ajovy 225 MG/1.5ML Soaj Generic drug: Fremanezumab-vfrm INJECT 225 MG INTO THE SKIN EVERY 30 DAYS   cetirizine 10 MG tablet Commonly known as: ZyrTEC Allergy Take 1 tablet (10 mg total) by mouth daily.   cholestyramine 4 g packet Commonly known as: QUESTRAN TAKE 1 PACKET MIXED WITH 4 OUNCES OF WATER EVERY DAY WITH BREAKFAST TAKE OTHER MEDS 1 HOUR BEFORE OR 4 TO 6 HOURS LATER   gabapentin 100 MG capsule Commonly known as: Neurontin Take 1 capsule (100 mg total) by mouth 2 (two) times daily.   ibuprofen 200 MG tablet Commonly known as: ADVIL Take 600 mg by mouth every 6 (six) hours as needed for headache.   ipratropium 0.03 % nasal spray Commonly known as: ATROVENT Place 2 sprays into both nostrils 2 (two) times daily as needed for rhinitis.   metoprolol tartrate 100 MG tablet Commonly known as: LOPRESSOR Take 100 mg 2 hours before cardiac ct   ondansetron 4 MG tablet Commonly known as: ZOFRAN Take 1 tablet (4 mg total) by mouth every 8 (eight) hours as needed for nausea or vomiting.   pantoprazole 40 MG tablet Commonly known as: PROTONIX TAKE 1 TABLET(40 MG) BY MOUTH TWICE DAILY   rizatriptan 10 MG disintegrating tablet Commonly known as: MAXALT-MLT Take 1 tablet (10 mg total) by mouth as needed for migraine. May repeat in 2 hours if needed, no more than 2 pills in 24 hours.        Signature:  Coralyn Helling, MD [facility] Pager - [phone] 07/23/2021, 3:11 PM

## 2021-07-25 ENCOUNTER — Other Ambulatory Visit: Payer: Self-pay

## 2021-07-25 ENCOUNTER — Ambulatory Visit (INDEPENDENT_AMBULATORY_CARE_PROVIDER_SITE_OTHER): Payer: BC Managed Care – PPO

## 2021-07-25 ENCOUNTER — Ambulatory Visit
Admission: RE | Admit: 2021-07-25 | Discharge: 2021-07-25 | Disposition: A | Discharge status: Home / Self Care | Payer: BC Managed Care – PPO | Source: Ambulatory Visit | Attending: Family Medicine | Admitting: Family Medicine

## 2021-07-25 VITALS — BP 114/77 | HR 80 | Temp 98.4°F | Resp 16

## 2021-07-25 DIAGNOSIS — M25522 Pain in left elbow: Secondary | ICD-10-CM | POA: Diagnosis not present

## 2021-07-25 MED ORDER — CYCLOBENZAPRINE HCL 10 MG PO TABS: 10.0000 mg | ORAL_TABLET | Freq: Every evening | ORAL | 0 refills | Status: DC | PRN

## 2021-07-25 MED ORDER — PREDNISONE 20 MG PO TABS: 40.0000 mg | ORAL_TABLET | Freq: Every day | ORAL | 0 refills | Status: DC

## 2021-07-25 NOTE — ED Triage Notes (Signed)
Denies any known injury.  C/O constant left elbow pain x 1.5 wks.  C/O very painful ROM.  Denies parasthesias.

## 2021-07-29 NOTE — ED Provider Notes (Signed)
[facility]    CSN: [id number] Arrival date & time: 07/25/21  0849      History   Chief Complaint Chief Complaint  Patient presents with   Elbow Pain   Other    Appointment 0900    HPI Tonya Love is a 45 y.o. female.   Presenting today with 1.5 weeks of constant left elbow pain, mild edema.  She denies decreased range of motion though very painful to move, and denies numbness, tingling, weakness, discoloration.  No injury to the elbow but does do repetitive heavy lifting at work.  So far trying over-the-counter pain relievers with minimal relief.     Past Medical History:  Diagnosis Date   Anxiety    Depression    GERD (gastroesophageal reflux disease)    Migraines    Pancreatitis     Patient Active Problem List   Diagnosis Date Noted   Early satiety 12/18/2019   Dysphagia 08/27/2019   Anemia 08/26/2019   Diarrhea 08/26/2019   GERD (gastroesophageal reflux disease) 08/26/2019   Nausea with vomiting 08/26/2019   History of diverticulitis 08/26/2019   Rectal bleeding 08/26/2019   Anxiety state, unspecified 02/11/2014    Past Surgical History:  Procedure Laterality Date   BIOPSY  11/06/2019   Procedure: BIOPSY;  Surgeon: Daneil Dolin, MD;  [facility];  Service: Endoscopy;;   CHOLECYSTECTOMY     COLONOSCOPY N/A 11/06/2019   Procedure: COLONOSCOPY;  Surgeon: Daneil Dolin, diverticulosis in the sigmoid colon, minimal grade 2 hemorrhoids, otherwise normal.  Segmental biopsies benign.  Repeat colonoscopy in 10 years.   ESOPHAGOGASTRODUODENOSCOPY N/A 11/06/2019   Procedure: ESOPHAGOGASTRODUODENOSCOPY (EGD);  Surgeon: Daneil Dolin, MD; mild Schatzki's ring s/p dilation, abnormal distal esophageal mucosa with benign biopsies, small hiatal hernia, otherwise normal.   MALONEY DILATION N/A 11/06/2019   Procedure: Venia Minks DILATION;  Surgeon: Daneil Dolin, MD;  [facility];  Service: Endoscopy;  Laterality: N/A;   TUBAL  LIGATION      OB History     Gravida      Para      Term      Preterm      AB      Living  2      SAB      IAB      Ectopic      Multiple      Live Births               Home Medications    Prior to Admission medications   Medication Sig Start Date End Date Taking? Authorizing Provider  AJOVY 225 MG/1.5ML SOAJ INJECT 225 MG INTO THE SKIN EVERY 30 DAYS 05/17/21  Yes Ward Givens, NP  cholestyramine (QUESTRAN) 4 g packet TAKE 1 PACKET MIXED WITH 4 OUNCES OF WATER EVERY DAY WITH BREAKFAST TAKE OTHER MEDS 1 HOUR BEFORE OR 4 TO 6 HOURS LATER 06/30/21  Yes Aliene Altes S, PA-C  cyclobenzaprine (FLEXERIL) 10 MG tablet Take 1 tablet (10 mg total) by mouth at bedtime as needed for muscle spasms. 07/25/21  Yes Volney American, PA-C  gabapentin (NEURONTIN) 100 MG capsule Take 1 capsule (100 mg total) by mouth 2 (two) times daily. 05/12/21  Yes Ward Givens, NP  pantoprazole (PROTONIX) 40 MG tablet TAKE 1 TABLET(40 MG) BY MOUTH TWICE DAILY 01/14/21  Yes Erenest Rasher, PA-C  predniSONE (DELTASONE) 20 MG tablet Take 2 tablets (40 mg total) by mouth daily with breakfast. 07/25/21  Yes Particia Nearing, PA-C  cetirizine (ZYRTEC ALLERGY) 10 MG tablet Take 1 tablet (10 mg total) by mouth daily. 08/15/20 03/01/21  Bing Neighbors, FNP  ibuprofen (ADVIL,MOTRIN) 200 MG tablet Take 600 mg by mouth every 6 (six) hours as needed for headache.    [provider]  ipratropium (ATROVENT) 0.03 % nasal spray Place 2 sprays into both nostrils 2 (two) times daily as needed for rhinitis. 08/15/20   Bing Neighbors, FNP  metoprolol tartrate (LOPRESSOR) 100 MG tablet Take 100 mg 2 hours before cardiac ct 06/30/21   Little Ishikawa, MD  ondansetron (ZOFRAN) 4 MG tablet Take 1 tablet (4 mg total) by mouth every 8 (eight) hours as needed for nausea or vomiting. 12/18/19   [name], PA-C  rizatriptan (MAXALT-MLT) 10 MG disintegrating tablet Take 1 tablet  (10 mg total) by mouth as needed for migraine. May repeat in 2 hours if needed, no more than 2 pills in 24 hours. 05/05/20   [name], NP  mirtazapine (REMERON) 30 MG tablet Take 1 tablet (30 mg total) by mouth at bedtime. 10/25/20 12/01/20  [name], MD    Family History Family History  Problem Relation Age of Onset   Hypertension Mother    Cancer Mother    Alcohol abuse Father    Alcohol abuse Paternal Grandfather    Depression Maternal Aunt    Depression Paternal Aunt    Colon cancer Neg Hx     Social History Social History   Tobacco Use   Smoking status: Never   Smokeless tobacco: Never  Vaping Use   Vaping Use: Never used  Substance Use Topics   Alcohol use: No   Drug use: No     Allergies   Codeine   Review of Systems Review of Systems Per HPI  Physical Exam Triage Vital Signs ED Triage Vitals  Enc Vitals Group     BP 07/25/21 0857 114/77     Pulse Rate 07/25/21 0857 80     Resp 07/25/21 0857 16     Temp 07/25/21 0857 98.4 F (36.9 C)     Temp Source 07/25/21 0857 Temporal     SpO2 07/25/21 0857 97 %     Weight --      Height --      Head Circumference --      Peak Flow --      Pain Score 07/25/21 0858 7     Pain Loc --      Pain Edu? --      Excl. in GC? --    No data found.  Updated Vital Signs BP 114/77   Pulse 80   Temp 98.4 F (36.9 C) (Temporal)   Resp 16   LMP 07/11/2021 (Approximate)   SpO2 97%   Visual Acuity Right Eye Distance:   Left Eye Distance:   Bilateral Distance:    Right Eye Near:   Left Eye Near:    Bilateral Near:     Physical Exam Vitals and nursing note reviewed.  Constitutional:      Appearance: Normal appearance. She is not ill-appearing.  HENT:     Head: Atraumatic.  Eyes:     Extraocular Movements: Extraocular movements intact.     Conjunctiva/sclera: Conjunctivae normal.  Cardiovascular:     Rate and Rhythm: Normal rate and regular rhythm.     Heart sounds: Normal heart sounds.   Pulmonary:     Effort: Pulmonary effort is normal.  Breath sounds: Normal breath sounds.  Musculoskeletal:        General: Swelling and tenderness present. No deformity. Normal range of motion.     Cervical back: Normal range of motion and neck supple.     Comments: Trace edema to left elbow diffusely.  Range of motion intact but painful.  Generalized tenderness to palpation left elbow and muscles of the forearm  Skin:    General: Skin is warm and dry.  Neurological:     Mental Status: She is alert and oriented to person, place, and time.     Comments: Left upper extremity neurovascularly intact  Psychiatric:        Mood and Affect: Mood normal.        Thought Content: Thought content normal.        Judgment: Judgment normal.    UC Treatments / Results  Labs (all labs ordered are listed, but only abnormal results are displayed) Labs Reviewed - No data to display  EKG   Radiology No results found.  Procedures Procedures (including critical care time)  Medications Ordered in UC Medications - No data to display  Initial Impression / Assessment and Plan / UC Course  I have reviewed the triage vital signs and the nursing notes.  Pertinent labs & imaging results that were available during my care of the patient were reviewed by me and considered in my medical decision making (see chart for details).     Suspect tendinitis of the left elbow.  We will treat with prednisone, Flexeril, stretches, rest, Ace wrap, over-the-counter pain relievers.  Discussed behavioral modification for repetitive motions at work to help reduce the strain on this area.  Sports med follow-up if worsening or not resolving.  Final Clinical Impressions(s) / UC Diagnoses   Final diagnoses:  Left elbow pain   Discharge Instructions   None    ED Prescriptions     Medication Sig Dispense Auth. Provider   predniSONE (DELTASONE) 20 MG tablet Take 2 tablets (40 mg total) by mouth daily with  breakfast. 10 tablet Volney American, PA-C   cyclobenzaprine (FLEXERIL) 10 MG tablet Take 1 tablet (10 mg total) by mouth at bedtime as needed for muscle spasms. 10 tablet Volney American, [place]      PDMP not reviewed this encounter.   Volney American, [place] 07/29/21 1859

## 2021-08-03 ENCOUNTER — Other Ambulatory Visit: Payer: Self-pay | Admitting: Cardiology

## 2021-08-03 NOTE — Telephone Encounter (Signed)
This is a [place] pt.  °

## 2021-08-18 ENCOUNTER — Other Ambulatory Visit: Payer: Self-pay

## 2021-08-18 ENCOUNTER — Ambulatory Visit (INDEPENDENT_AMBULATORY_CARE_PROVIDER_SITE_OTHER): Payer: BC Managed Care – PPO

## 2021-08-18 ENCOUNTER — Ambulatory Visit
Admission: EM | Admit: 2021-08-18 | Discharge: 2021-08-18 | Disposition: A | Discharge status: Home / Self Care | Payer: BC Managed Care – PPO | Attending: Family Medicine | Admitting: Family Medicine

## 2021-08-18 DIAGNOSIS — J069 Acute upper respiratory infection, unspecified: Secondary | ICD-10-CM | POA: Diagnosis not present

## 2021-08-18 DIAGNOSIS — Z20828 Contact with and (suspected) exposure to other viral communicable diseases: Secondary | ICD-10-CM

## 2021-08-18 DIAGNOSIS — J029 Acute pharyngitis, unspecified: Secondary | ICD-10-CM

## 2021-08-18 DIAGNOSIS — R0789 Other chest pain: Secondary | ICD-10-CM

## 2021-08-18 DIAGNOSIS — R059 Cough, unspecified: Secondary | ICD-10-CM

## 2021-08-18 DIAGNOSIS — R062 Wheezing: Secondary | ICD-10-CM

## 2021-08-18 MED ORDER — PROMETHAZINE-DM 6.25-15 MG/5ML PO SYRP: 5.0000 mL | ORAL_SOLUTION | Freq: Four times a day (QID) | ORAL | 0 refills | Status: DC | PRN

## 2021-08-18 MED ORDER — PREDNISONE 20 MG PO TABS: 40.0000 mg | ORAL_TABLET | Freq: Every day | ORAL | 0 refills | Status: DC

## 2021-08-18 NOTE — ED Provider Notes (Signed)
[facility]   [id number] 08/18/21 Arrival Time: 1300  ASSESSMENT & PLAN:  1. Viral URI with cough   2. Exposure to the flu   3. Left-sided chest wall pain   4. Wheezing    I have personally viewed the imaging studies ordered this visit. No sings of PNA.  Discussed typical duration of viral illnesses. COVID-19/influenza testing sent. Work note. OTC symptom care as needed.  Meds ordered this encounter  Medications   predniSONE (DELTASONE) 20 MG tablet    Sig: Take 2 tablets (40 mg total) by mouth daily.    Dispense:  10 tablet    Refill:  0   promethazine-dextromethorphan (PROMETHAZINE-DM) 6.25-15 MG/5ML syrup    Sig: Take 5 mLs by mouth 4 (four) times daily as needed for cough.    Dispense:  118 mL    Refill:  0     Follow-up Information     [facility]   Specialty: Urgent Care Why: If worsening or failing to improve as anticipated. Contact information: [address] [place] [address] [phone]                Reviewed expectations re: course of current medical issues. Questions answered. Outlined signs and symptoms indicating need for more acute intervention. Understanding verbalized. After Visit Summary given.   SUBJECTIVE: History from: patient. Tonya Love is a 45 y.o. female who reports: ST, cough; x 4 days. Pain of L side chest with coughing. Denies: difficulty breathing. Normal PO intake without n/v/d. Ques wheezing at times.   OBJECTIVE:  Vitals:   08/18/21 1446  BP: (!) 128/91  Pulse: 82  Resp: 18  Temp: 97.9 F (36.6 C)  TempSrc: Oral  SpO2: 99%    General appearance: alert; no distress Eyes: PERRLA; EOMI; conjunctiva normal HENT: [place]; AT; with nasal congestion Neck: supple  Lungs: speaks full sentences without difficulty; unlabored; mild bilat wheezing Extremities: no edema Skin: warm and dry Neurologic: normal gait Psychological: alert and  cooperative; normal mood and affect  Labs:  Labs Reviewed  COVID-19, FLU A+B NAA    Imaging: DG Chest 2 View  Result Date: 08/18/2021 CLINICAL DATA:  Sore throat and productive cough.  Left-sided pain. EXAM: CHEST - 2 VIEW COMPARISON:  01/07/2005 FINDINGS: The cardiomediastinal silhouette is within normal limits. There is new peribronchial thickening with accentuation of the interstitial markings. No confluent airspace opacity, overt pulmonary edema, pleural effusion, or pneumothorax is identified. Right upper quadrant abdominal surgical clips are noted. No acute osseous abnormality is seen. IMPRESSION: Airway thickening which may reflect bronchitis. Electronically Signed   By: Sebastian Ache M.D.   On: 08/18/2021 15:40    Allergies  Allergen Reactions   Codeine Nausea Only    Past Medical History:  Diagnosis Date   Anxiety    Depression    GERD (gastroesophageal reflux disease)    Migraines    Pancreatitis    Social History   Socioeconomic History   Marital status: Legally Separated    Spouse name: Not on file   Number of children: Not on file   Years of education: Not on file   Highest education level: Not on file  Occupational History   Not on file  Tobacco Use   Smoking status: Never   Smokeless tobacco: Never  Vaping Use   Vaping Use: Never used  Substance and Sexual Activity   Alcohol use: No   Drug use: No   Sexual  activity: Not Currently  Other Topics Concern   Not on file  Social History Narrative   Not on file   Social Determinants of Health   Financial Resource Strain: Not on file  Food Insecurity: Not on file  Transportation Needs: Not on file  Physical Activity: Not on file  Stress: Not on file  Social Connections: Not on file  Intimate Partner Violence: Not on file   Family History  Problem Relation Age of Onset   Hypertension Mother    Cancer Mother    Alcohol abuse Father    Alcohol abuse Paternal Grandfather    Depression Maternal  Aunt    Depression Paternal Aunt    Colon cancer Neg Hx    Past Surgical History:  Procedure Laterality Date   BIOPSY  11/06/2019   Procedure: BIOPSY;  Surgeon: Corbin Ade, MD;  [facility];  Service: Endoscopy;;   CHOLECYSTECTOMY     COLONOSCOPY N/A 11/06/2019   Procedure: COLONOSCOPY;  Surgeon: Corbin Ade, diverticulosis in the sigmoid colon, minimal grade 2 hemorrhoids, otherwise normal.  Segmental biopsies benign.  Repeat colonoscopy in 10 years.   ESOPHAGOGASTRODUODENOSCOPY N/A 11/06/2019   Procedure: ESOPHAGOGASTRODUODENOSCOPY (EGD);  Surgeon: Corbin Ade, MD; mild Schatzki's ring s/p dilation, abnormal distal esophageal mucosa with benign biopsies, small hiatal hernia, otherwise normal.   MALONEY DILATION N/A 11/06/2019   Procedure: Elease Hashimoto DILATION;  Surgeon: Corbin Ade, MD;  [facility];  Service: Endoscopy;  Laterality: N/A;   TUBAL LIGATION       Mardella Layman, MD 08/18/21 [phone]

## 2021-08-18 NOTE — ED Triage Notes (Signed)
Patient states that on Friday morning she woke up with a bad sore throat. She is now having a wet cough with green mucus.   She states she took Dayquil and Vicks Vaper Rub without any relief, last dose at 9 am this morning.   She states she has a pain on her left side.  Denies Fever

## 2021-08-20 LAB — COVID-19, FLU A+B NAA
Influenza A, NAA: NOT DETECTED
Influenza B, NAA: NOT DETECTED
SARS-CoV-2, NAA: NOT DETECTED

## 2021-08-25 ENCOUNTER — Other Ambulatory Visit: Payer: Self-pay

## 2021-08-25 ENCOUNTER — Ambulatory Visit
Admission: RE | Admit: 2021-08-25 | Discharge: 2021-08-25 | Disposition: A | Discharge status: Home / Self Care | Payer: BC Managed Care – PPO | Source: Ambulatory Visit | Attending: Family Medicine | Admitting: Family Medicine

## 2021-08-25 VITALS — BP 142/89 | HR 96 | Temp 98.3°F | Resp 18 | Ht 62.0 in | Wt 193.0 lb

## 2021-08-25 DIAGNOSIS — J22 Unspecified acute lower respiratory infection: Secondary | ICD-10-CM

## 2021-08-25 MED ORDER — PROMETHAZINE-DM 6.25-15 MG/5ML PO SYRP: 5.0000 mL | ORAL_SOLUTION | Freq: Four times a day (QID) | ORAL | 0 refills | Status: DC | PRN

## 2021-08-25 MED ORDER — AZITHROMYCIN 250 MG PO TABS: ORAL_TABLET | ORAL | 0 refills | Status: DC

## 2021-08-25 NOTE — ED Provider Notes (Signed)
[facility]    CSN: [id number] Arrival date & time: 08/25/21  1439      History   Chief Complaint No chief complaint on file.   HPI Tonya Love is a 45 y.o. female.   Presenting today with ongoing worsening productive cough for over a week now.  Was tested for COVID and flu last week both being negative and was treated with prednisone, Phenergan DM which did not resolve symptoms.  Has been taking Mucinex, DayQuil over-the-counter still with no relief.  Denies fever, chills, chest pain, shortness of breath though is becoming sore on the left lower lung field particularly with coughing.  No known history of pulmonary disease.   Past Medical History:  Diagnosis Date   Anxiety    Depression    GERD (gastroesophageal reflux disease)    Migraines    Pancreatitis     Patient Active Problem List   Diagnosis Date Noted   Early satiety 12/18/2019   Dysphagia 08/27/2019   Anemia 08/26/2019   Diarrhea 08/26/2019   GERD (gastroesophageal reflux disease) 08/26/2019   Nausea with vomiting 08/26/2019   History of diverticulitis 08/26/2019   Rectal bleeding 08/26/2019   Anxiety state, unspecified 02/11/2014    Past Surgical History:  Procedure Laterality Date   BIOPSY  11/06/2019   Procedure: BIOPSY;  Surgeon: Daneil Dolin, MD;  [facility];  Service: Endoscopy;;   CHOLECYSTECTOMY     COLONOSCOPY N/A 11/06/2019   Procedure: COLONOSCOPY;  Surgeon: Daneil Dolin, diverticulosis in the sigmoid colon, minimal grade 2 hemorrhoids, otherwise normal.  Segmental biopsies benign.  Repeat colonoscopy in 10 years.   ESOPHAGOGASTRODUODENOSCOPY N/A 11/06/2019   Procedure: ESOPHAGOGASTRODUODENOSCOPY (EGD);  Surgeon: Daneil Dolin, MD; mild Schatzki's ring s/p dilation, abnormal distal esophageal mucosa with benign biopsies, small hiatal hernia, otherwise normal.   MALONEY DILATION N/A 11/06/2019   Procedure: Venia Minks DILATION;  Surgeon: Daneil Dolin, MD;   Location: [facility];  Service: Endoscopy;  Laterality: N/A;   TUBAL LIGATION      OB History     Gravida      Para      Term      Preterm      AB      Living  2      SAB      IAB      Ectopic      Multiple      Live Births               Home Medications    Prior to Admission medications   Medication Sig Start Date End Date Taking? Authorizing Provider  azithromycin (ZITHROMAX) 250 MG tablet Take first 2 tablets together, then 1 every day until finished. 08/25/21  Yes Volney American, PA-C  promethazine-dextromethorphan (PROMETHAZINE-DM) 6.25-15 MG/5ML syrup Take 5 mLs by mouth 4 (four) times daily as needed. 08/25/21  Yes Volney American, PA-C  AJOVY 225 MG/1.5ML SOAJ INJECT 225 MG INTO THE SKIN EVERY 30 DAYS 05/17/21   Ward Givens, NP  cetirizine (ZYRTEC ALLERGY) 10 MG tablet Take 1 tablet (10 mg total) by mouth daily. 08/15/20 03/01/21  Scot Jun, FNP  cholestyramine (QUESTRAN) 4 g packet TAKE 1 PACKET MIXED WITH 4 OUNCES OF WATER EVERY DAY WITH BREAKFAST TAKE OTHER MEDS 1 HOUR BEFORE OR 4 TO 6 HOURS LATER 06/30/21   Erenest Rasher, PA-C  cyclobenzaprine (FLEXERIL) 10 MG tablet Take 1 tablet (10 mg total) by mouth  at bedtime as needed for muscle spasms. 07/25/21   Particia Nearing, PA-C  gabapentin (NEURONTIN) 100 MG capsule Take 1 capsule (100 mg total) by mouth 2 (two) times daily. 05/12/21   [name], NP  ipratropium (ATROVENT) 0.03 % nasal spray Place 2 sprays into both nostrils 2 (two) times daily as needed for rhinitis. 08/15/20   Bing Neighbors, FNP  metoprolol tartrate (LOPRESSOR) 100 MG tablet Take 100 mg 2 hours before cardiac ct 06/30/21   Little Ishikawa, MD  ondansetron (ZOFRAN) 4 MG tablet Take 1 tablet (4 mg total) by mouth every 8 (eight) hours as needed for nausea or vomiting. 12/18/19   [name], PA-C  pantoprazole (PROTONIX) 40 MG tablet TAKE 1 TABLET(40 MG) BY MOUTH TWICE DAILY 01/14/21    [name], PA-C  predniSONE (DELTASONE) 20 MG tablet Take 2 tablets (40 mg total) by mouth daily. 08/18/21   Mardella Layman, MD  promethazine-dextromethorphan (PROMETHAZINE-DM) 6.25-15 MG/5ML syrup Take 5 mLs by mouth 4 (four) times daily as needed for cough. 08/18/21   Mardella Layman, MD  rizatriptan (MAXALT-MLT) 10 MG disintegrating tablet Take 1 tablet (10 mg total) by mouth as needed for migraine. May repeat in 2 hours if needed, no more than 2 pills in 24 hours. 05/05/20   [name], NP  mirtazapine (REMERON) 30 MG tablet Take 1 tablet (30 mg total) by mouth at bedtime. 10/25/20 12/01/20  [name], MD    Family History Family History  Problem Relation Age of Onset   Hypertension Mother    Cancer Mother    Alcohol abuse Father    Alcohol abuse Paternal Grandfather    Depression Maternal Aunt    Depression Paternal Aunt    Colon cancer Neg Hx     Social History Social History   Tobacco Use   Smoking status: Never   Smokeless tobacco: Never  Vaping Use   Vaping Use: Never used  Substance Use Topics   Alcohol use: No   Drug use: No     Allergies   Codeine   Review of Systems Review of Systems Per HPI  Physical Exam Triage Vital Signs ED Triage Vitals  Enc Vitals Group     BP 08/25/21 1457 (!) 142/89     Pulse Rate 08/25/21 1457 96     Resp 08/25/21 1457 18     Temp 08/25/21 1457 98.3 F (36.8 C)     Temp Source 08/25/21 1457 Oral     SpO2 08/25/21 1457 97 %     Weight 08/25/21 1458 193 lb (87.5 kg)     Height 08/25/21 1458 5\' 2"  (1.575 m)     Head Circumference --      Peak Flow --      Pain Score 08/25/21 1458 7     Pain Loc --      Pain Edu? --      Excl. in GC? --    No data found.  Updated Vital Signs BP (!) 142/89 (BP Location: Right Arm)   Pulse 96   Temp 98.3 F (36.8 C) (Oral)   Resp 18   Ht 5\' 2"  (1.575 m)   Wt 193 lb (87.5 kg)   LMP 08/25/2021 (Exact Date)   SpO2 97%   BMI 35.30 kg/m   Visual Acuity Right Eye  Distance:   Left Eye Distance:   Bilateral Distance:    Right Eye Near:   Left Eye Near:    Bilateral Near:  Physical Exam Vitals and nursing note reviewed.  Constitutional:      Appearance: Normal appearance.  HENT:     Head: Atraumatic.     Right Ear: Tympanic membrane and external ear normal.     Left Ear: Tympanic membrane and external ear normal.     Nose: Nose normal.     Mouth/Throat:     Mouth: Mucous membranes are moist.     Pharynx: Posterior oropharyngeal erythema present.  Eyes:     Extraocular Movements: Extraocular movements intact.     Conjunctiva/sclera: Conjunctivae normal.  Cardiovascular:     Rate and Rhythm: Normal rate and regular rhythm.     Heart sounds: Normal heart sounds.  Pulmonary:     Effort: Pulmonary effort is normal.     Breath sounds: Normal breath sounds. No wheezing or rales.  Musculoskeletal:        General: Normal range of motion.     Cervical back: Normal range of motion and neck supple.  Skin:    General: Skin is warm and dry.  Neurological:     Mental Status: She is alert and oriented to person, place, and time.  Psychiatric:        Mood and Affect: Mood normal.        Thought Content: Thought content normal.     UC Treatments / Results  Labs (all labs ordered are listed, but only abnormal results are displayed) Labs Reviewed - No data to display  EKG   Radiology No results found.  Procedures Procedures (including critical care time)  Medications Ordered in UC Medications - No data to display  Initial Impression / Assessment and Plan / UC Course  I have reviewed the triage vital signs and the nursing notes.  Pertinent labs & imaging results that were available during my care of the patient were reviewed by me and considered in my medical decision making (see chart for details).     Given worsening course and chronicity will treat with Zithromax, Phenergan DM and discussed supportive home care and return  precautions.  Final Clinical Impressions(s) / UC Diagnoses   Final diagnoses:  Lower respiratory infection   Discharge Instructions   None    ED Prescriptions     Medication Sig Dispense Auth. Provider   azithromycin (ZITHROMAX) 250 MG tablet Take first 2 tablets together, then 1 every day until finished. 6 tablet Volney American, [place]   promethazine-dextromethorphan (PROMETHAZINE-DM) 6.25-15 MG/5ML syrup Take 5 mLs by mouth 4 (four) times daily as needed. 100 mL Volney American, [place]      PDMP not reviewed this encounter.   Volney American, [place] 08/25/21 1542

## 2021-08-25 NOTE — ED Triage Notes (Signed)
Pt reports seen last Wednesday for same and reports was given prednisone and cough medicine with minimal relief. Pt reports continued productive cough and left rib/side pain. Nad noted. Pt denies any fever. Covid and flu were negative last week.

## 2021-09-01 ENCOUNTER — Other Ambulatory Visit: Payer: Self-pay

## 2021-09-01 ENCOUNTER — Ambulatory Visit (INDEPENDENT_AMBULATORY_CARE_PROVIDER_SITE_OTHER): Payer: BC Managed Care – PPO

## 2021-09-01 ENCOUNTER — Ambulatory Visit
Admission: RE | Admit: 2021-09-01 | Discharge: 2021-09-01 | Disposition: A | Discharge status: Home / Self Care | Payer: BC Managed Care – PPO | Source: Ambulatory Visit | Attending: Family Medicine | Admitting: Family Medicine

## 2021-09-01 VITALS — BP 121/81 | HR 98 | Temp 98.3°F | Resp 18

## 2021-09-01 DIAGNOSIS — R0781 Pleurodynia: Secondary | ICD-10-CM

## 2021-09-01 DIAGNOSIS — R0782 Intercostal pain: Secondary | ICD-10-CM

## 2021-09-01 DIAGNOSIS — R0602 Shortness of breath: Secondary | ICD-10-CM | POA: Diagnosis not present

## 2021-09-01 MED ORDER — TRAMADOL HCL 50 MG PO TABS
50.0000 mg | ORAL_TABLET | Freq: Four times a day (QID) | ORAL | 0 refills | Status: DC | PRN
Start: 2021-09-01 — End: 2022-02-24

## 2021-09-01 MED ORDER — DICLOFENAC SODIUM 75 MG PO TBEC: 75.0000 mg | DELAYED_RELEASE_TABLET | Freq: Two times a day (BID) | ORAL | 0 refills | Status: DC

## 2021-09-01 MED ORDER — ONDANSETRON 4 MG PO TBDP: 4.0000 mg | ORAL_TABLET | Freq: Three times a day (TID) | ORAL | 0 refills | Status: DC | PRN

## 2021-09-01 NOTE — Discharge Instructions (Addendum)
Be aware, you have been prescribed pain medications that may cause drowsiness. Do not combine with alcohol or recreational drugs. Please do not drive, operate heavy machinery, or take part in activities that require making important decisions while on this medication as your judgement may be clouded.  

## 2021-09-01 NOTE — ED Triage Notes (Signed)
Pt reports left sided middle back pain x 3 weeks, radiates to abdomen 3 days ago. Pain is worse when laying on the left side, walking.

## 2021-09-02 NOTE — Progress Notes (Signed)
Referring Provider: Kirstie Peri, MD Primary Care Physician:  Pcp, No Primary GI Physician: Dr. Jena Gauss  Chief Complaint  Patient presents with   Gastroesophageal Reflux    Ok as long as she takes meds   Diarrhea    occ   back pain    Left back under ribs. PCP thought was muscle related. Flexeril and Tramadol not helping    HPI:   Tonya Love is a 45 y.o. female presenting today for routine 6 month follow-up of GERD and diarrhea.   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 difficile and GI pathogen panel negative in December 2020, toilet tissue hematochezia in the setting of hemorrhoids, mild anemia with iron panel on the low end of normal suspected to be influenced by menorrhagia. TCS and EGD February 2021 with diverticulosis in sigmoid colon, minimal grade 2 hemorrhoids, and benign random colon biopsies with recommendations to repeat in 10 years.  EGD with mild Schatzki ring s/p dilation, abnormal distal esophageal mucosa with benign biopsy, small hiatal hernia, otherwise normal exam. GES wnl in April 2021.  Celiac serologies negative. Thyroid function low in March 2021, most recently within normal limits in October 2022.  CT A/P with contrast 05/08/2020 with no significant findings.  Previously noted improvement in nausea/vomiting with changing PPI and avoiding dairy products.  Last seen in our office 03/01/2021.  GERD was well controlled on Protonix 40 mg twice daily.  No nausea or vomiting.  Reported rare dysphagia, but did not feel this needed to be evaluated.  Diarrhea well controlled with Questran 4 g daily.  No overt GI bleeding.  She continue to avoid dairy and limit greasy foods.  Reported she was taking iron daily.  Had gone 5 months without a period and just had one 2 weeks prior to her  office visit lasting 6-7 days.  States she went to donate plasma and was turned away as her numbers were too low.  Plan to continue her current medications, update CBC and iron panel.  Labs completed 03/01/2021 with hemoglobin 11.4 (stable), iron panel low normal, but entirely stable.  Recommended she continue iron daily.  Most recent labs October 2022 with hemoglobin 12.0 with normocytic indices.   Today:  GERD: Well controlled as long as she takes Protonix daily.  No dysphagia. No nausea or vomiting.   Diarrhea: Overall, well controlled with the Questran 4 g daily.  Occasional breakthrough symptoms, but nothing routine.  No BRBPR or melena.  Weight is stable.  She has been having some left-sided back pain under her ribs.  Started about 3 weeks ago.  States she was just sitting on the couch when she first noticed the pain.  Couple days later, she developed a cough.  She was prescribed cough medication and prednisone.  She later went back to the doctor due to worsening pain and ongoing cough with phlegm production.  She was prescribed antibiotic which did help with her cough.  Since that time, she has had some mild increase shortness of breath.  She went back to the doctor on Wednesday.  She had a chest x-ray which did not reveal any abnormalities.  She was prescribed diclofenac and tramadol as back pain was suspected to be secondary to a muscle issue.  Pain does radiate from her back around to the left upper quadrant.  Hurts  when she is walking around, laying on the left side, and getting in and out of the car.  Not affected by meals or bowel movements.  Denies fever or urinary symptoms.  Denies nausea or vomiting.  Denies taking any NSAIDs prior to recently being started on diclofenac.  Past Medical History:  Diagnosis Date   Anxiety    Depression    GERD (gastroesophageal reflux disease)    Migraines    Pancreatitis     Past Surgical History:  Procedure Laterality Date   BIOPSY   11/06/2019   Procedure: BIOPSY;  Surgeon: Corbin Ade, MD;  [facility];  Service: Endoscopy;;   CHOLECYSTECTOMY     COLONOSCOPY N/A 11/06/2019   Procedure: COLONOSCOPY;  Surgeon: Corbin Ade, diverticulosis in the sigmoid colon, minimal grade 2 hemorrhoids, otherwise normal.  Segmental biopsies benign.  Repeat colonoscopy in 10 years.   ESOPHAGOGASTRODUODENOSCOPY N/A 11/06/2019   Procedure: ESOPHAGOGASTRODUODENOSCOPY (EGD);  Surgeon: Corbin Ade, MD; mild Schatzki's ring s/p dilation, abnormal distal esophageal mucosa with benign biopsies, small hiatal hernia, otherwise normal.   MALONEY DILATION N/A 11/06/2019   Procedure: Elease Hashimoto DILATION;  Surgeon: Corbin Ade, MD;  [facility];  Service: Endoscopy;  Laterality: N/A;   TUBAL LIGATION      Current Outpatient Medications  Medication Sig Dispense Refill   AJOVY 225 MG/1.5ML SOAJ INJECT 225 MG INTO THE SKIN EVERY 30 DAYS 1.5 mL 11   cetirizine (ZYRTEC ALLERGY) 10 MG tablet Take 1 tablet (10 mg total) by mouth daily. 30 tablet 0   cholestyramine (QUESTRAN) 4 g packet TAKE 1 PACKET MIXED WITH 4 OUNCES OF WATER EVERY DAY WITH BREAKFAST TAKE OTHER MEDS 1 HOUR BEFORE OR 4 TO 6 HOURS LATER 60 each 2   diclofenac (VOLTAREN) 75 MG EC tablet Take 1 tablet (75 mg total) by mouth 2 (two) times daily. 14 tablet 0   gabapentin (NEURONTIN) 100 MG capsule Take 1 capsule (100 mg total) by mouth 2 (two) times daily. 60 capsule 3   ipratropium (ATROVENT) 0.03 % nasal spray Place 2 sprays into both nostrils 2 (two) times daily as needed for rhinitis. 30 mL 0   ondansetron (ZOFRAN-ODT) 4 MG disintegrating tablet Take 1 tablet (4 mg total) by mouth every 8 (eight) hours as needed for nausea or vomiting. 15 tablet 0   pantoprazole (PROTONIX) 40 MG tablet TAKE 1 TABLET(40 MG) BY MOUTH TWICE DAILY 60 tablet 5   rizatriptan (MAXALT-MLT) 10 MG disintegrating tablet Take 1 tablet (10 mg total) by mouth as needed for migraine. May  repeat in 2 hours if needed, no more than 2 pills in 24 hours. 9 tablet 11   traMADol (ULTRAM) 50 MG tablet Take 1 tablet (50 mg total) by mouth every 6 (six) hours as needed. 15 tablet 0   Current Facility-Administered Medications  Medication Dose Route Frequency Provider Last Rate Last Admin   ipratropium (ATROVENT) 0.03 % nasal spray 2 spray  2 spray Each Nare BID PRN Bing Neighbors, FNP        Allergies as of 09/03/2021 - Review Complete 09/03/2021  Allergen Reaction Noted   Codeine Nausea Only 12/25/2014    Family History  Problem Relation Age of Onset   Hypertension Mother    Cancer Mother    Alcohol abuse Father    Alcohol abuse Paternal Grandfather    Depression Maternal Aunt    Depression Paternal Aunt    Colon cancer Neg Hx  Pancreatic cancer Neg Hx     Social History   Socioeconomic History   Marital status: Legally Separated    Spouse name: Not on file   Number of children: Not on file   Years of education: Not on file   Highest education level: Not on file  Occupational History   Not on file  Tobacco Use   Smoking status: Never   Smokeless tobacco: Never  Vaping Use   Vaping Use: Never used  Substance and Sexual Activity   Alcohol use: No   Drug use: No   Sexual activity: Not Currently  Other Topics Concern   Not on file  Social History Narrative   Not on file   Social Determinants of Health   Financial Resource Strain: Not on file  Food Insecurity: Not on file  Transportation Needs: Not on file  Physical Activity: Not on file  Stress: Not on file  Social Connections: Not on file    Review of Systems: Gen: Denies fever, chills, cold or flulike symptoms, presyncope, syncope. CV: Denies chest pain, palpitations. Resp: Admits to mild increase shortness of breath with exertion, no shortness of breath at rest, admits to mild cough. GI: See HPI Heme: See HPI  Physical Exam: BP 122/79    Pulse 97    Temp (!) 97.5 F (36.4 C) (Temporal)     Ht 5\' 2"  (1.575 m)    Wt 199 lb 12.8 oz (90.6 kg)    LMP 08/20/2021 (Approximate)    BMI 36.54 kg/m  General:   Alert and oriented. No distress noted. Pleasant and cooperative.  Head:  Normocephalic and atraumatic. Eyes:  Conjuctiva clear without scleral icterus. Heart:  S1, S2 present without murmurs appreciated. Lungs:  Clear to auscultation bilaterally. No wheezes, rales, or rhonchi. No distress.  Abdomen:  +BS, soft, and non-distended.  Mild TTP in epigastric and LUQ region, increasing tenderness in the left flank and in left back along lower ribs. No abdominal rebound or guarding. No HSM or masses noted. Msk:  Symmetrical without gross deformities. Normal posture. Extremities:  Without edema. Neurologic:  Alert and  oriented x4 Psych: Normal mood and affect.   Assessment: 45 year old female with history of GERD, dysphagia, diverticulitis in 2016 and 2020, postprandial diarrhea with associated abdominal cramping, presenting today for routine 72-month follow-up of GERD and diarrhea. Also reporting left back/flank pain.   GERD: Well-controlled on Protonix 40 mg twice daily.  No alarm symptoms.  Diarrhea:  Likely multifactorial in the setting of bile salt diarrhea, dietary intolerances, and possible IBS-D.  Currently well managed with Questran 4 g daily and avoiding dairy products.  No alarm symptoms.  Colonoscopy on file February 2021, due for repeat in 2031.  Left flank/left back pain: Started about 3 weeks ago when she also developed an acute respiratory illness with cough.  Respiratory symptoms improving.  Only with occasional cough and mild shortness of breath with exertion, but continues with left-sided back pain at the lower rib cage radiating around to the left upper quadrant without any improvement.  Symptoms are worsened by walking around, laying on her left side, and getting in and out of the car.  Denies any association with meals or bowel movements and denies nausea, vomiting,  fever, chills, urinary symptoms. She was previously prescribed Flexeril which was not helpful.  Recently evaluated at urgent care on 12/14.  Unilateral left ribs/chest x-ray with no significant abnormalities.  She is started on diclofenac and tramadol.  On exam, she does  have mild TTP in the epigastric and LUQ region, but increasing tenderness along the left flank and into left back along lower rib cage.  Overall, suspect MSK etiology.  Advise she complete her course of diclofenac and monitor her symptoms.  If persistent symptoms, requested that she let us know and we can pursue further evaluation.   Plan:  Continue Protonix 40 mg twice daily. Continue Questran 4 g daily. Complete course of diclofenac and continue to use tramadol as needed for left-sided back/flank pain radiating to abdomen.  If persistent symptoms, she is to let us know and we can evaluate this further. Follow-up in 1 year or sooner if needed.   Ermalinda Memos, PA-C [facility] 09/03/2021

## 2021-09-03 ENCOUNTER — Encounter: Payer: Self-pay | Admitting: Gastroenterology

## 2021-09-03 ENCOUNTER — Other Ambulatory Visit: Payer: Self-pay

## 2021-09-03 ENCOUNTER — Ambulatory Visit: Payer: BC Managed Care – PPO | Admitting: Gastroenterology

## 2021-09-03 VITALS — BP 122/79 | HR 97 | Temp 97.5°F | Ht 62.0 in | Wt 199.8 lb

## 2021-09-03 DIAGNOSIS — R109 Unspecified abdominal pain: Secondary | ICD-10-CM | POA: Insufficient documentation

## 2021-09-03 DIAGNOSIS — K219 Gastro-esophageal reflux disease without esophagitis: Secondary | ICD-10-CM

## 2021-09-03 DIAGNOSIS — R197 Diarrhea, unspecified: Secondary | ICD-10-CM

## 2021-09-03 NOTE — Patient Instructions (Addendum)
Continue taking Protonix 40 mg daily 30 minutes before breakfast and dinner.  Continue Questran 4 g daily to control your diarrhea.  Complete your course of diclofenac and continue to use tramadol as needed for left-sided back/flank pain that is radiating to your abdomen.  Your symptoms sound more musculoskeletal related.  However, if you continue with symptoms without improvement after her course of diclofenac, please let me know, and we will evaluate this further.   It was good to see you today! I hope you have a very [place] Christmas and happy new year!  Ermalinda Memos, PA-C [facility]

## 2021-09-04 NOTE — ED Provider Notes (Signed)
[facility] CARE CENTER   [id number] 09/01/21 Arrival Time: 1352  ASSESSMENT & PLAN:  1. Rib pain on left side    I have personally viewed the imaging studies ordered this visit. No rib fx. No pneumothorax. Discussed.  OTC symptom care as needed. Begin: Meds ordered this encounter  Medications   traMADol (ULTRAM) 50 MG tablet    Sig: Take 1 tablet (50 mg total) by mouth every 6 (six) hours as needed.    Dispense:  15 tablet    Refill:  0   diclofenac (VOLTAREN) 75 MG EC tablet    Sig: Take 1 tablet (75 mg total) by mouth 2 (two) times daily.    Dispense:  14 tablet    Refill:  0   ondansetron (ZOFRAN-ODT) 4 MG disintegrating tablet    Sig: Take 1 tablet (4 mg total) by mouth every 8 (eight) hours as needed for nausea or vomiting.    Dispense:  15 tablet    Refill:  0   Reports pain medications occas cause nausea.   Follow-up Information     [facility].   Specialty: Urgent Care Why: If worsening or failing to improve as anticipated. Contact information: [address], [address] [place] [address] [phone]                Reviewed expectations re: course of current medical issues. Questions answered. Outlined signs and symptoms indicating need for more acute intervention. Understanding verbalized. After Visit Summary given.   SUBJECTIVE: History from: patient. Tonya Love is a 45 y.o. female who returns with continuing L side pain after coughing past week or two. No SOB reported. aFebrile. Pain worse with certain movements. Tylenol without relief. Normal PO intake without n/v/d.  OBJECTIVE:  Vitals:   09/01/21 1421  BP: 121/81  Pulse: 98  Resp: 18  Temp: 98.3 F (36.8 C)  TempSrc: Oral  SpO2: 98%    General appearance: alert; no distress Eyes: PERRLA; EOMI; conjunctiva normal Neck: supple  Lungs: speaks full sentences without difficulty; unlabored; TTP over L mid-axillary  ribs Extremities: no edema Skin: warm and dry Neurologic: normal gait Psychological: alert and cooperative; normal mood and affect  Labs: Results for orders placed or performed during the hospital encounter of 08/18/21  Covid-19, Flu A+B ([facility])   Specimen: Nasopharyngeal Swab   Naso  Result Value Ref Range   SARS-CoV-2, NAA Not Detected Not Detected   Influenza A, NAA Not Detected Not Detected   Influenza B, NAA Not Detected Not Detected   Test Information: Comment    Labs Reviewed - No data to display  Imaging: DG Ribs Unilateral W/Chest Left  Result Date: 09/01/2021 CLINICAL DATA:  Left posterior rib pain and shortness of breath for the past 3 weeks. EXAM: LEFT RIBS AND CHEST - 3+ VIEW COMPARISON:  Chest x-ray dated August 18, 2021. FINDINGS: No fracture or other bone lesions are seen involving the ribs. There is no evidence of pneumothorax or pleural effusion. Both lungs are clear. Heart size and mediastinal contours are within normal limits. IMPRESSION: Negative. Electronically Signed   By: Obie Dredge M.D.   On: 09/01/2021 15:26    Allergies  Allergen Reactions   Codeine Nausea Only    Past Medical History:  Diagnosis Date   Anxiety    Depression    GERD (gastroesophageal reflux disease)    Migraines    Pancreatitis    Social History   Socioeconomic History  Marital status: Legally Separated    Spouse name: Not on file   Number of children: Not on file   Years of education: Not on file   Highest education level: Not on file  Occupational History   Not on file  Tobacco Use   Smoking status: Never   Smokeless tobacco: Never  Vaping Use   Vaping Use: Never used  Substance and Sexual Activity   Alcohol use: No   Drug use: No   Sexual activity: Not Currently  Other Topics Concern   Not on file  Social History Narrative   Not on file   Social Determinants of Health   Financial Resource Strain: Not on file  Food Insecurity: Not on file   Transportation Needs: Not on file  Physical Activity: Not on file  Stress: Not on file  Social Connections: Not on file  Intimate Partner Violence: Not on file   Family History  Problem Relation Age of Onset   Hypertension Mother    Cancer Mother    Alcohol abuse Father    Alcohol abuse Paternal Grandfather    Depression Maternal Aunt    Depression Paternal Aunt    Colon cancer Neg Hx    Pancreatic cancer Neg Hx    Past Surgical History:  Procedure Laterality Date   BIOPSY  11/06/2019   Procedure: BIOPSY;  Surgeon: Corbin Ade, MD;  [facility]: [facility];  Service: Endoscopy;;   CHOLECYSTECTOMY     COLONOSCOPY N/A 11/06/2019   Procedure: COLONOSCOPY;  Surgeon: Corbin Ade, diverticulosis in the sigmoid colon, minimal grade 2 hemorrhoids, otherwise normal.  Segmental biopsies benign.  Repeat colonoscopy in 10 years.   ESOPHAGOGASTRODUODENOSCOPY N/A 11/06/2019   Procedure: ESOPHAGOGASTRODUODENOSCOPY (EGD);  Surgeon: Corbin Ade, MD; mild Schatzki's ring s/p dilation, abnormal distal esophageal mucosa with benign biopsies, small hiatal hernia, otherwise normal.   MALONEY DILATION N/A 11/06/2019   Procedure: Elease Hashimoto DILATION;  Surgeon: Corbin Ade, MD;  Location: [facility];  Service: Endoscopy;  Laterality: N/A;   TUBAL LIGATION       Mardella Layman, MD 09/04/21 1015

## 2021-09-14 ENCOUNTER — Telehealth: Payer: Self-pay | Admitting: *Deleted

## 2021-09-14 DIAGNOSIS — R109 Unspecified abdominal pain: Secondary | ICD-10-CM

## 2021-09-14 NOTE — Telephone Encounter (Signed)
Advise CT abdomen with contrast.

## 2021-09-14 NOTE — Telephone Encounter (Signed)
Pt called stating she is still not feeling good. She stated she still has pain on left side. Has had some nausea and vomiting. No fever. Normal BM's  She has taking all of the medication that was prescribed. Is taking ibuprofen for pain, but is not helping. Informed her to go to ED if pain becomes unbearable.

## 2021-09-16 NOTE — Telephone Encounter (Signed)
Agree with recommendations. I do not see that a CT has been arranged. Please arrange CT abdomen with contrast ASAP. Dx: LUQ pain, left flank pain, nausea with vomiting.

## 2021-09-16 NOTE — Telephone Encounter (Signed)
Noted  

## 2021-09-16 NOTE — Telephone Encounter (Signed)
PA pending review via NIA. Track # [id number]

## 2021-09-21 ENCOUNTER — Other Ambulatory Visit: Payer: Self-pay | Admitting: Adult Health

## 2021-09-21 NOTE — Telephone Encounter (Signed)
PA still pending review.

## 2021-09-22 ENCOUNTER — Telehealth: Payer: Self-pay | Admitting: Cardiology

## 2021-09-22 ENCOUNTER — Other Ambulatory Visit: Payer: Self-pay | Admitting: Gastroenterology

## 2021-09-22 DIAGNOSIS — R109 Unspecified abdominal pain: Secondary | ICD-10-CM

## 2021-09-22 DIAGNOSIS — R1012 Left upper quadrant pain: Secondary | ICD-10-CM

## 2021-09-22 NOTE — Telephone Encounter (Signed)
Spoke with patient. She stated she was trying to schedule her cardiac ct but there was no order for it. She already picked up her metoprolol from the pharmacy. She is waiting for the order to be placed. Thank you.

## 2021-09-22 NOTE — Telephone Encounter (Addendum)
Noted.  Spoke with patient, she continues with same left-sided abdominal pain/flank pain with some radiation down towards her lower abdomen.  Pain is more constant, some associated nausea and occasional vomiting.  Denies association with meals or bowel movements.   We will plan for CBC, CMP, Lipase and Abdominal US. I am placing lab orders. Patient is aware to have blood work completed at [facility].   [facility] Clinical Pool:  Please arrange abdominal ultrasound ASAP. Dx: LUQ abdominal pain, left flank pain

## 2021-09-22 NOTE — Telephone Encounter (Signed)
Patient was calling in to schedule a CT but there is no order for it. Please advise

## 2021-09-22 NOTE — Telephone Encounter (Signed)
Received fax from RAD MD stating CT is being denied. Patient is needing a recent US done and updated lab work as well to show that CT is still needed.

## 2021-09-22 NOTE — Telephone Encounter (Signed)
Checked PA and it is still pending review 

## 2021-09-23 DIAGNOSIS — R1012 Left upper quadrant pain: Secondary | ICD-10-CM | POA: Diagnosis not present

## 2021-09-23 DIAGNOSIS — R109 Unspecified abdominal pain: Secondary | ICD-10-CM | POA: Diagnosis not present

## 2021-09-23 LAB — COMPLETE METABOLIC PANEL WITH GFR
AG Ratio: 1.4 (calc) (ref 1.0–2.5)
ALT: 10 U/L (ref 6–29)
AST: 15 U/L (ref 10–30)
Albumin: 3.9 g/dL (ref 3.6–5.1)
Alkaline phosphatase (APISO): 74 U/L (ref 31–125)
BUN: 10 mg/dL (ref 7–25)
CO2: 26 mmol/L (ref 20–32)
Calcium: 9.4 mg/dL (ref 8.6–10.2)
Chloride: 106 mmol/L (ref 98–110)
Creat: 0.85 mg/dL (ref 0.50–0.99)
Globulin: 2.7 g/dL (calc) (ref 1.9–3.7)
Glucose, Bld: 88 mg/dL (ref 65–139)
Potassium: 3.7 mmol/L (ref 3.5–5.3)
Sodium: 141 mmol/L (ref 135–146)
Total Bilirubin: 0.3 mg/dL (ref 0.2–1.2)
Total Protein: 6.6 g/dL (ref 6.1–8.1)
eGFR: 87 mL/min/{1.73_m2} (ref >=60)

## 2021-09-23 LAB — CBC WITH DIFFERENTIAL/PLATELET
Absolute Monocytes: 490 cells/uL (ref 200–950)
Basophils Absolute: 42 cells/uL (ref 0–200)
Basophils Relative: 0.5 %
Eosinophils Absolute: 257 cells/uL (ref 15–500)
Eosinophils Relative: 3.1 %
HCT: 32.8 % — ABNORMAL LOW (ref 35.0–45.0)
Hemoglobin: 10.8 g/dL — ABNORMAL LOW (ref 11.7–15.5)
Lymphs Abs: 2922 cells/uL (ref 850–3900)
MCH: 30.6 pg (ref 27.0–33.0)
MCHC: 32.9 g/dL (ref 32.0–36.0)
MCV: 92.9 fL (ref 80.0–100.0)
MPV: 9.4 fL (ref 7.5–12.5)
Monocytes Relative: 5.9 %
Neutro Abs: 4590 cells/uL (ref 1500–7800)
Neutrophils Relative %: 55.3 %
Platelets: 424 10*3/uL — ABNORMAL HIGH (ref 140–400)
RBC: 3.53 10*6/uL — ABNORMAL LOW (ref 3.80–5.10)
RDW: 12.1 % (ref 11.0–15.0)
Total Lymphocyte: 35.2 %
WBC: 8.3 10*3/uL (ref 3.8–10.8)

## 2021-09-23 LAB — LIPASE: Lipase: 28 U/L (ref 7–60)

## 2021-09-23 NOTE — Telephone Encounter (Signed)
Cardiac Ct order is entered, I have informed patient

## 2021-09-23 NOTE — Addendum Note (Signed)
Addended by: Barbarann Ehlers A on: 09/23/2021 07:45 AM   Modules accepted: Orders

## 2021-09-23 NOTE — Addendum Note (Signed)
Addended by: Cheron Every on: 09/23/2021 07:59 AM   Modules accepted: Orders

## 2021-09-23 NOTE — Telephone Encounter (Signed)
Called pt. Aware to arrive tomorrow 1/6 at 10:00am for u/s. Npo midnight. Also reminded to have labs done.

## 2021-09-24 ENCOUNTER — Other Ambulatory Visit: Payer: Self-pay

## 2021-09-24 ENCOUNTER — Ambulatory Visit (HOSPITAL_COMMUNITY)
Admission: RE | Admit: 2021-09-24 | Discharge: 2021-09-24 | Disposition: A | Discharge status: Home / Self Care | Payer: BC Managed Care – PPO | Source: Ambulatory Visit | Attending: Gastroenterology | Admitting: Gastroenterology

## 2021-09-24 DIAGNOSIS — N281 Cyst of kidney, acquired: Secondary | ICD-10-CM | POA: Diagnosis not present

## 2021-09-24 DIAGNOSIS — R1012 Left upper quadrant pain: Secondary | ICD-10-CM | POA: Diagnosis not present

## 2021-09-24 DIAGNOSIS — R109 Unspecified abdominal pain: Secondary | ICD-10-CM | POA: Diagnosis not present

## 2021-09-24 DIAGNOSIS — K760 Fatty (change of) liver, not elsewhere classified: Secondary | ICD-10-CM | POA: Diagnosis not present

## 2021-09-25 ENCOUNTER — Other Ambulatory Visit: Payer: Self-pay | Admitting: Family Medicine

## 2021-09-27 ENCOUNTER — Other Ambulatory Visit: Payer: Self-pay | Admitting: *Deleted

## 2021-09-27 DIAGNOSIS — R109 Unspecified abdominal pain: Secondary | ICD-10-CM

## 2021-09-27 DIAGNOSIS — R112 Nausea with vomiting, unspecified: Secondary | ICD-10-CM

## 2021-09-27 DIAGNOSIS — R1012 Left upper quadrant pain: Secondary | ICD-10-CM

## 2021-09-28 ENCOUNTER — Encounter (HOSPITAL_COMMUNITY): Payer: Self-pay | Admitting: Radiology

## 2021-09-28 ENCOUNTER — Other Ambulatory Visit: Payer: Self-pay

## 2021-09-28 ENCOUNTER — Ambulatory Visit (HOSPITAL_COMMUNITY)
Admission: RE | Admit: 2021-09-28 | Discharge: 2021-09-28 | Disposition: A | Discharge status: Home / Self Care | Payer: BC Managed Care – PPO | Source: Ambulatory Visit | Attending: Gastroenterology | Admitting: Gastroenterology

## 2021-09-28 DIAGNOSIS — R1012 Left upper quadrant pain: Secondary | ICD-10-CM

## 2021-09-28 DIAGNOSIS — N200 Calculus of kidney: Secondary | ICD-10-CM | POA: Diagnosis not present

## 2021-09-28 DIAGNOSIS — R109 Unspecified abdominal pain: Secondary | ICD-10-CM

## 2021-09-28 DIAGNOSIS — R112 Nausea with vomiting, unspecified: Secondary | ICD-10-CM

## 2021-09-28 MED ORDER — IOHEXOL 300 MG/ML  SOLN
100.0000 mL | Freq: Once | INTRAMUSCULAR | Status: AC | PRN
  Administered 2021-09-28: 100 mL via INTRAVENOUS

## 2021-09-29 ENCOUNTER — Other Ambulatory Visit: Payer: Self-pay | Admitting: Gastroenterology

## 2021-09-29 DIAGNOSIS — R197 Diarrhea, unspecified: Secondary | ICD-10-CM

## 2021-10-01 ENCOUNTER — Telehealth (HOSPITAL_COMMUNITY): Payer: Self-pay | Admitting: Emergency Medicine

## 2021-10-01 NOTE — Telephone Encounter (Signed)
Reaching out to patient to offer assistance regarding upcoming cardiac imaging study; pt verbalizes understanding of appt date/time, parking situation and where to check in, pre-test NPO status and medications ordered, and verified current allergies; name and call back number provided for further questions should they arise Tonya Bond RN Navigator Cardiac Imaging [facility] [phone] office [phone] cell  Arrival 9:00a Denies iv issues 100mg  metoprolol tartrate

## 2021-10-03 NOTE — Progress Notes (Signed)
Cardiology Office Note:    Date:  10/06/2021   ID:  Tonya Love, DOB 1975/12/21, MRN [id number]  PCP:  Pcp, No  Cardiologist:  Little Ishikawa, MD  Electrophysiologist:  None   Referring MD: No ref. provider found   Chief Complaint  Patient presents with   Chest Pain    History of Present Illness:    Tonya Love is a 46 y.o. female with a hx of anxiety, depression, pancreatitis who presents for follow-up.  She is seen initially for evaluation of palpitations and chest pain on 06/30/2021.Marland Kitchen  She reports she has been having palpitations which she describes as feeling her heart will race and then go slow.  Lasts for couple minutes, can occur throughout the day.  Also reports feeling short of breath with exertion.  In addition reports occasional chest pains.  Describes as sharp pain left-sided pain.  Can occur at rest.  However does report can be brought on with exertion.  She walks 1-2 times per week for 3 miles and will sometimes have chest pain when walking.  She has been told she seems to stop breathing when she sleeps.  States that she feels tired throughout the day.  Has been told she snores.  No smoking history.  Family history includes paternal uncle died of MI at 24.  Zio patch x9 days on 07/21/2021 showed no significant abnormalities.  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  Since last clinic visit, she reports that she has continued to have chest pain.  Has been occurring 3-4 times per week.  Continues to have palpitations and shortness of breath.  Reports she gets short of breath with walking up stairs.   Past Medical History:  Diagnosis Date   Anxiety    Depression    GERD (gastroesophageal reflux disease)    Migraines    Pancreatitis     Past Surgical History:  Procedure Laterality Date   BIOPSY  11/06/2019   Procedure: BIOPSY;  Surgeon: Corbin Ade, MD;  [facility];   Service: Endoscopy;;   CHOLECYSTECTOMY     COLONOSCOPY N/A 11/06/2019   Procedure: COLONOSCOPY;  Surgeon: Corbin Ade, diverticulosis in the sigmoid colon, minimal grade 2 hemorrhoids, otherwise normal.  Segmental biopsies benign.  Repeat colonoscopy in 10 years.   ESOPHAGOGASTRODUODENOSCOPY N/A 11/06/2019   Procedure: ESOPHAGOGASTRODUODENOSCOPY (EGD);  Surgeon: Corbin Ade, MD; mild Schatzki's ring s/p dilation, abnormal distal esophageal mucosa with benign biopsies, small hiatal hernia, otherwise normal.   MALONEY DILATION N/A 11/06/2019   Procedure: Elease Hashimoto DILATION;  Surgeon: Corbin Ade, MD;  Location[facility];  Service: Endoscopy;  Laterality: N/A;   TUBAL LIGATION      Current Medications: Current Meds  Medication Sig   AJOVY 225 MG/1.5ML SOAJ INJECT 225 MG INTO THE SKIN EVERY 30 DAYS   cetirizine (ZYRTEC ALLERGY) 10 MG tablet Take 1 tablet (10 mg total) by mouth daily.   cholestyramine (QUESTRAN) 4 g packet TAKE 1 PACKET MIXED WITH 4 OUNCES OF WATER EVERY DAY WITH BREAKFAST TAKE OTHER MEDS 1 HOUR BEFORE OR 4 TO 6 HOURS LATER   gabapentin (NEURONTIN) 100 MG capsule Take 1 capsule (100 mg total) by mouth 2 (two) times daily.   pantoprazole (PROTONIX) 40 MG tablet TAKE 1 TABLET(40 MG) BY MOUTH TWICE DAILY   rizatriptan (MAXALT-MLT) 10 MG disintegrating tablet Take 1 tablet (10 mg total) by mouth as needed for  migraine. May repeat in 2 hours if needed, no more than 2 pills in 24 hours.   Current Facility-Administered Medications for the 10/06/21 encounter (Office Visit) with Little Ishikawa, MD  Medication   ipratropium (ATROVENT) 0.03 % nasal spray 2 spray     Allergies:   Codeine   Social History   Socioeconomic History   Marital status: Legally Separated    Spouse name: Not on file   Number of children: Not on file   Years of education: Not on file   Highest education level: Not on file  Occupational History   Not on file  Tobacco Use   Smoking  status: Never   Smokeless tobacco: Never  Vaping Use   Vaping Use: Never used  Substance and Sexual Activity   Alcohol use: No   Drug use: No   Sexual activity: Not Currently  Other Topics Concern   Not on file  Social History Narrative   Not on file   Social Determinants of Health   Financial Resource Strain: Not on file  Food Insecurity: Not on file  Transportation Needs: Not on file  Physical Activity: Not on file  Stress: Not on file  Social Connections: Not on file     Family History: The patient's family history includes Alcohol abuse in her father and paternal grandfather; Cancer in her mother; Depression in her maternal aunt and paternal aunt; Hypertension in her mother. There is no history of Colon cancer or Pancreatic cancer.  ROS:   Please see the history of present illness.     All other systems reviewed and are negative.  EKGs/Labs/Other Studies Reviewed:    The following studies were reviewed today:   EKG:  EKG is  ordered today.  The ekg ordered today demonstrates normal sinus rhythm, rate 81, no ST abnormalities  Recent Labs: 06/30/2021: TSH 0.815 09/23/2021: ALT 10; BUN 10; Creat 0.85; Hemoglobin 10.8; Platelets 424; Potassium 3.7; Sodium 141  Recent Lipid Panel    Component Value Date/Time   CHOL 178 06/30/2021 1457   TRIG 190 (H) 06/30/2021 1457   HDL 43 06/30/2021 1457   CHOLHDL 4.1 06/30/2021 1457   VLDL 38 06/30/2021 1457   LDLCALC 97 06/30/2021 1457    Physical Exam:    VS:  BP 124/82    Pulse 94    Ht 5\' 2"  (1.575 m)    Wt 197 lb (89.4 kg)    SpO2 98%    BMI 36.03 kg/m     Wt Readings from Last 3 Encounters:  10/06/21 197 lb (89.4 kg)  09/03/21 199 lb 12.8 oz (90.6 kg)  08/25/21 193 lb (87.5 kg)     GEN:  Well nourished, well developed in no acute distress HEENT: Normal NECK: No JVD; No carotid bruits LYMPHATICS: No lymphadenopathy CARDIAC: RRR, no murmurs, rubs, gallops RESPIRATORY:  Clear to auscultation without rales,  wheezing or rhonchi  ABDOMEN: Soft, non-tender, non-distended MUSCULOSKELETAL:  No edema; No deformity  SKIN: Warm and dry NEUROLOGIC:  Alert and oriented x 3 PSYCHIATRIC:  Normal affect   ASSESSMENT:    1. SOB (shortness of breath)   2. Chest pain of uncertain etiology   3. Palpitations   4. Dilation of pulmonary artery (HCC)     PLAN:    Palpitations: Zio patch x9 days on 07/21/2021 showed no significant abnormalities.   Chest pain: Atypical in description but does report can be brought on by exertion.  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  Check echocardiogram to evaluate for pulmonary hypertension.  DOE: Check echocardiogram to rule out structural heart disease as above  Observed apnea/daytime somnolence/snoring: Recommend sleep study  RTC in 6 months  Medication Adjustments/Labs and Tests Ordered: Current medicines are reviewed at length with the patient today.  Concerns regarding medicines are outlined above.  Orders Placed This Encounter  Procedures   ECHOCARDIOGRAM COMPLETE   No orders of the defined types were placed in this encounter.   Patient Instructions  Medication Instructions:  Your physician recommends that you continue on your current medications as directed. Please refer to the Current Medication list given to you today.  *If you need a refill on your cardiac medications before your next appointment, please call your pharmacy*   Lab Work: None If you have labs (blood work) drawn today and your tests are completely normal, you will receive your results only by: MyChart Message (if you have MyChart) OR A paper copy in the mail If you have any lab test that is abnormal or we need to change your treatment, we will call you to review the results.   Testing/Procedures: Your physician has requested that you have an echocardiogram. Echocardiography is a painless test  that uses sound waves to create images of your heart. It provides your doctor with information about the size and shape of your heart and how well your hearts chambers and valves are working. This procedure takes approximately one hour. There are no restrictions for this procedure.    Follow-Up: At [facility], you and your health needs are our priority.  As part of our continuing mission to provide you with exceptional heart care, we have created designated Provider Care Teams.  These Care Teams include your primary Cardiologist (physician) and Advanced Practice Providers (APPs -  Physician Assistants and Nurse Practitioners) who all work together to provide you with the care you need, when you need it.  We recommend signing up for the patient portal called "MyChart".  Sign up information is provided on this After Visit Summary.  MyChart is used to connect with patients for Virtual Visits (Telemedicine).  Patients are able to view lab/test results, encounter notes, upcoming appointments, etc.  Non-urgent messages can be sent to your provider as well.   To learn more about what you can do with MyChart, go to [email or web address].    Your next appointment:   6 month(s)  The format for your next appointment:   In Person  Provider:   Epifanio Lesches, MD     Other Instructions      Signed, Little Ishikawa, MD  10/06/2021 2:17 PM    [facility]

## 2021-10-04 ENCOUNTER — Other Ambulatory Visit (HOSPITAL_COMMUNITY): Payer: Self-pay | Admitting: Cardiology

## 2021-10-04 ENCOUNTER — Telehealth: Payer: Self-pay | Admitting: *Deleted

## 2021-10-04 ENCOUNTER — Other Ambulatory Visit: Payer: Self-pay

## 2021-10-04 ENCOUNTER — Ambulatory Visit (HOSPITAL_COMMUNITY)
Admission: RE | Admit: 2021-10-04 | Discharge: 2021-10-04 | Disposition: A | Discharge status: Home / Self Care | Payer: BC Managed Care – PPO | Source: Ambulatory Visit | Attending: Cardiology | Admitting: Cardiology

## 2021-10-04 ENCOUNTER — Encounter (HOSPITAL_COMMUNITY): Payer: Self-pay

## 2021-10-04 DIAGNOSIS — Z789 Other specified health status: Secondary | ICD-10-CM

## 2021-10-04 DIAGNOSIS — R079 Chest pain, unspecified: Secondary | ICD-10-CM | POA: Insufficient documentation

## 2021-10-04 MED ORDER — METOPROLOL TARTRATE 5 MG/5ML IV SOLN
INTRAVENOUS | Status: AC
  Filled 2021-10-04: qty 5

## 2021-10-04 MED ORDER — NITROGLYCERIN 0.4 MG SL SUBL: 0.8000 mg | SUBLINGUAL_TABLET | Freq: Once | SUBLINGUAL | Status: AC

## 2021-10-04 MED ORDER — IOHEXOL 350 MG/ML SOLN
95.0000 mL | Freq: Once | INTRAVENOUS | Status: AC | PRN
  Administered 2021-10-04: 95 mL via INTRAVENOUS

## 2021-10-04 MED ORDER — NITROGLYCERIN 0.4 MG SL SUBL
SUBLINGUAL_TABLET | SUBLINGUAL | Status: AC
  Administered 2021-10-04: 0.8 mg via SUBLINGUAL
  Filled 2021-10-04: qty 2

## 2021-10-04 NOTE — Telephone Encounter (Signed)
Elizabeth from [facility] called and stated that pt needs to recollect the stool samples for C-diff, because it was to formed and they could not run the test. Spoke to pt, informed her that she would need to do another stool sample. Pt voiced understanding.

## 2021-10-04 NOTE — Telephone Encounter (Signed)
Is she having diarrhea?  If not, no need for stool studies at this point, and we can move forward with an EGD to evaluate her upper abdominal pain, nausea, and vomiting.

## 2021-10-05 ENCOUNTER — Encounter: Payer: Self-pay | Admitting: *Deleted

## 2021-10-05 ENCOUNTER — Other Ambulatory Visit: Payer: Self-pay

## 2021-10-05 DIAGNOSIS — R112 Nausea with vomiting, unspecified: Secondary | ICD-10-CM

## 2021-10-05 DIAGNOSIS — R1012 Left upper quadrant pain: Secondary | ICD-10-CM

## 2021-10-05 NOTE — Telephone Encounter (Signed)
Will await EGD order from [facility].

## 2021-10-05 NOTE — Telephone Encounter (Signed)
Called pt, EGD w/Dr. Marletta Lor scheduled for 10/15/21 at 12:45pm. Advised her to go [facility] lab 10/13/21 for pregnancy test. Lab letter/procedure instructions sent to pt via MyChart and mailed. Orders entered.

## 2021-10-05 NOTE — Telephone Encounter (Signed)
[address] [place], no [place] needed for EGD. Ref# [id number].

## 2021-10-05 NOTE — Telephone Encounter (Signed)
This encounter was created in error - please disregard.

## 2021-10-05 NOTE — Telephone Encounter (Signed)
-----   Message from Little Ishikawa, MD sent at 10/05/2021  6:49 AM EST ----- Normal heart arteries.  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

## 2021-10-05 NOTE — Telephone Encounter (Signed)
Spoke with patient. She is no longer having diarrhea.  She continues with LUQ abdominal pain and nausea.  No vomiting in the last couple of days.  We will proceed with EGD. Patient is ok to have procedure performed by Dr. Gala Romney or Dr. Abbey Chatters.  [facility] clinical pool: Please arrange EGD with Dr. Gala Romney with conscious sedation or Dr. Abbey Chatters with propofol. Dx: LUQ abdominal pain, nausea, vomiting. ASA II

## 2021-10-05 NOTE — Telephone Encounter (Signed)
Spoke to pt, informed her of recommendations. Pt voiced understanding. Informed her that someone would be in contact with her to arrange EGD.  Routing to [employer]

## 2021-10-06 ENCOUNTER — Other Ambulatory Visit: Payer: Self-pay

## 2021-10-06 ENCOUNTER — Ambulatory Visit (INDEPENDENT_AMBULATORY_CARE_PROVIDER_SITE_OTHER): Payer: BC Managed Care – PPO | Admitting: Cardiology

## 2021-10-06 ENCOUNTER — Encounter: Payer: Self-pay | Admitting: Cardiology

## 2021-10-06 VITALS — BP 124/82 | HR 94 | Ht 62.0 in | Wt 197.0 lb

## 2021-10-06 DIAGNOSIS — R002 Palpitations: Secondary | ICD-10-CM

## 2021-10-06 DIAGNOSIS — R0602 Shortness of breath: Secondary | ICD-10-CM

## 2021-10-06 DIAGNOSIS — R079 Chest pain, unspecified: Secondary | ICD-10-CM | POA: Diagnosis not present

## 2021-10-06 DIAGNOSIS — I288 Other diseases of pulmonary vessels: Secondary | ICD-10-CM

## 2021-10-06 DIAGNOSIS — M7661 Achilles tendinitis, right leg: Secondary | ICD-10-CM | POA: Diagnosis not present

## 2021-10-06 DIAGNOSIS — M79671 Pain in right foot: Secondary | ICD-10-CM | POA: Diagnosis not present

## 2021-10-06 NOTE — Patient Instructions (Signed)
Medication Instructions:  Your physician recommends that you continue on your current medications as directed. Please refer to the Current Medication list given to you today.  *If you need a refill on your cardiac medications before your next appointment, please call your pharmacy*   Lab Work: None If you have labs (blood work) drawn today and your tests are completely normal, you will receive your results only by: MyChart Message (if you have MyChart) OR A paper copy in the mail If you have any lab test that is abnormal or we need to change your treatment, we will call you to review the results.   Testing/Procedures: Your physician has requested that you have an echocardiogram. Echocardiography is a painless test that uses sound waves to create images of your heart. It provides your doctor with information about the size and shape of your heart and how well your hearts chambers and valves are working. This procedure takes approximately one hour. There are no restrictions for this procedure.    Follow-Up: At [facility], you and your health needs are our priority.  As part of our continuing mission to provide you with exceptional heart care, we have created designated Provider Care Teams.  These Care Teams include your primary Cardiologist (physician) and Advanced Practice Providers (APPs -  Physician Assistants and Nurse Practitioners) who all work together to provide you with the care you need, when you need it.  We recommend signing up for the patient portal called "MyChart".  Sign up information is provided on this After Visit Summary.  MyChart is used to connect with patients for Virtual Visits (Telemedicine).  Patients are able to view lab/test results, encounter notes, upcoming appointments, etc.  Non-urgent messages can be sent to your provider as well.   To learn more about what you can do with MyChart, go to [email or web address].    Your next appointment:   6  month(s)  The format for your next appointment:   In Person  Provider:   Epifanio Lesches, MD     Other Instructions

## 2021-10-10 ENCOUNTER — Other Ambulatory Visit: Payer: Self-pay

## 2021-10-10 ENCOUNTER — Ambulatory Visit
Admission: RE | Admit: 2021-10-10 | Discharge: 2021-10-10 | Disposition: A | Discharge status: Home / Self Care | Payer: BC Managed Care – PPO | Source: Ambulatory Visit | Attending: Urgent Care | Admitting: Urgent Care

## 2021-10-10 VITALS — BP 129/85 | HR 98 | Temp 98.1°F | Resp 18

## 2021-10-10 DIAGNOSIS — R0981 Nasal congestion: Secondary | ICD-10-CM | POA: Diagnosis not present

## 2021-10-10 DIAGNOSIS — R52 Pain, unspecified: Secondary | ICD-10-CM

## 2021-10-10 DIAGNOSIS — U071 COVID-19: Secondary | ICD-10-CM | POA: Diagnosis not present

## 2021-10-10 DIAGNOSIS — G43809 Other migraine, not intractable, without status migrainosus: Secondary | ICD-10-CM

## 2021-10-10 MED ORDER — PAXLOVID (300/100) 20 X 150 MG & 10 X 100MG PO TBPK: 1.0000 | ORAL_TABLET | Freq: Two times a day (BID) | ORAL | 0 refills | Status: DC

## 2021-10-10 NOTE — ED Triage Notes (Signed)
At home covid test yesterday was positive.  Headache, body aches, sore throat, runny nose since Thursday.

## 2021-10-10 NOTE — ED Provider Notes (Addendum)
[place]   MRN: [id number] DOB: [birth year]  Subjective:   Tonya Love is a 46 y.o. female presenting for 2 day history of acute onset headaches, body aches, malaise and fatigue, throat pain, runny and stuffy nose.  She had exposure to COVID-19 through her son.  She did a test herself and turned out to be positive.  Would like to get COVID antiviral medications.  She does have a history of migraines and takes medications for this.  She also has a history of pancreatitis, acid reflux, anxiety and depression.  No chest pain, shortness of breath or wheezing.  Patient has not had much of a cough.  She has been using over-the-counter medications to help with supportive care.  No history of respiratory disorders.   Current Facility-Administered Medications:    ipratropium (ATROVENT) 0.03 % nasal spray 2 spray, 2 spray, Each Nare, BID PRN, Scot Jun, FNP  Current Outpatient Medications:    AJOVY 225 MG/1.5ML SOAJ, INJECT 225 MG INTO THE SKIN EVERY 30 DAYS, Disp: 1.5 mL, Rfl: 11   cholestyramine (QUESTRAN) 4 g packet, TAKE 1 PACKET MIXED WITH 4 OUNCES OF WATER EVERY DAY WITH BREAKFAST TAKE OTHER MEDS 1 HOUR BEFORE OR 4 TO 6 HOURS LATER, Disp: 60 each, Rfl: 2   diclofenac (VOLTAREN) 75 MG EC tablet, Take 1 tablet (75 mg total) by mouth 2 (two) times daily. (Patient not taking: Reported on 10/04/2021), Disp: 14 tablet, Rfl: 0   gabapentin (NEURONTIN) 100 MG capsule, Take 1 capsule (100 mg total) by mouth 2 (two) times daily., Disp: 180 capsule, Rfl: 1   ibuprofen (ADVIL) 200 MG tablet, Take 400 mg by mouth every 8 (eight) hours as needed for headache or moderate pain., Disp: , Rfl:    ipratropium (ATROVENT) 0.03 % nasal spray, Place 2 sprays into both nostrils 2 (two) times daily as needed for rhinitis. (Patient not taking: Reported on 10/04/2021), Disp: 30 mL, Rfl: 0   methylPREDNISolone (MEDROL DOSEPAK) 4 MG TBPK tablet, Take 4 mg by mouth See admin instructions. Tapered  dose. Start date 10/07/21, Disp: , Rfl:    ondansetron (ZOFRAN-ODT) 4 MG disintegrating tablet, Take 1 tablet (4 mg total) by mouth every 8 (eight) hours as needed for nausea or vomiting., Disp: 15 tablet, Rfl: 0   pantoprazole (PROTONIX) 40 MG tablet, TAKE 1 TABLET(40 MG) BY MOUTH TWICE DAILY, Disp: 60 tablet, Rfl: 5   rizatriptan (MAXALT-MLT) 10 MG disintegrating tablet, Take 1 tablet (10 mg total) by mouth as needed for migraine. May repeat in 2 hours if needed, no more than 2 pills in 24 hours. (Patient not taking: Reported on 10/07/2021), Disp: 9 tablet, Rfl: 11   traMADol (ULTRAM) 50 MG tablet, Take 1 tablet (50 mg total) by mouth every 6 (six) hours as needed. (Patient not taking: Reported on 10/04/2021), Disp: 15 tablet, Rfl: 0   Allergies  Allergen Reactions   Codeine Nausea Only    Past Medical History:  Diagnosis Date   Anxiety    Depression    GERD (gastroesophageal reflux disease)    Migraines    Pancreatitis      Past Surgical History:  Procedure Laterality Date   BIOPSY  11/06/2019   Procedure: BIOPSY;  Surgeon: Daneil Dolin, MD;  [facility];  Service: Endoscopy;;   CHOLECYSTECTOMY     COLONOSCOPY N/A 11/06/2019   Procedure: COLONOSCOPY;  Surgeon: Daneil Dolin, diverticulosis in the sigmoid colon, minimal grade 2 hemorrhoids, otherwise normal.  Segmental  biopsies benign.  Repeat colonoscopy in 10 years.   ESOPHAGOGASTRODUODENOSCOPY N/A 11/06/2019   Procedure: ESOPHAGOGASTRODUODENOSCOPY (EGD);  Surgeon: Daneil Dolin, MD; mild Schatzki's ring s/p dilation, abnormal distal esophageal mucosa with benign biopsies, small hiatal hernia, otherwise normal.   MALONEY DILATION N/A 11/06/2019   Procedure: Venia Minks DILATION;  Surgeon: Daneil Dolin, MD;  Location: [facility];  Service: Endoscopy;  Laterality: N/A;   TUBAL LIGATION      Family History  Problem Relation Age of Onset   Hypertension Mother    Cancer Mother    Alcohol abuse Father    Alcohol  abuse Paternal Grandfather    Depression Maternal Aunt    Depression Paternal Aunt    Colon cancer Neg Hx    Pancreatic cancer Neg Hx     Social History   Tobacco Use   Smoking status: Never   Smokeless tobacco: Never  Vaping Use   Vaping Use: Never used  Substance Use Topics   Alcohol use: No   Drug use: No    ROS   Objective:   Vitals: BP 129/85 (BP Location: Right Arm)    Pulse 98    Temp 98.1 F (36.7 C) (Temporal)    Resp 18    LMP 09/20/2021 (Approximate)    SpO2 97%   Physical Exam Constitutional:      General: She is not in acute distress.    Appearance: Normal appearance. She is well-developed. She is not ill-appearing, toxic-appearing or diaphoretic.  HENT:     Head: Normocephalic and atraumatic.     Nose: Nose normal.     Mouth/Throat:     Mouth: Mucous membranes are moist.  Eyes:     General: No scleral icterus.       Right eye: No discharge.        Left eye: No discharge.     Extraocular Movements: Extraocular movements intact.  Cardiovascular:     Rate and Rhythm: Normal rate.     Heart sounds: No murmur heard.   No friction rub. No gallop.  Pulmonary:     Effort: Pulmonary effort is normal. No respiratory distress.     Breath sounds: No stridor. No wheezing, rhonchi or rales.  Chest:     Chest wall: No tenderness.  Skin:    General: Skin is warm and dry.  Neurological:     General: No focal deficit present.     Mental Status: She is alert and oriented to person, place, and time.  Psychiatric:        Mood and Affect: Mood normal.        Behavior: Behavior normal.   Recent Results (from the past 2160 hour(s))  Covid-19, Flu A+B ([facility])     Status: None   Collection Time: 08/18/21  2:46 PM   Specimen: Nasopharyngeal Swab   Naso  Result Value Ref Range   SARS-CoV-2, NAA Not Detected Not Detected   Influenza A, NAA Not Detected Not Detected   Influenza B, NAA Not Detected Not Detected   Test Information: Comment     Comment: This  nucleic acid amplification test was developed and its performance characteristics determined by [employer]. Nucleic acid amplification tests include RT-PCR and TMA. This test has not been FDA cleared or approved. This test has been authorized by FDA under an Emergency Use Authorization (EUA). This test is only authorized for the duration of time the declaration that circumstances exist justifying the authorization of the emergency use of  in vitro diagnostic tests for detection of SARS-CoV-2 virus and/or diagnosis of COVID-19 infection under section 564(b)(1) of the Act, 21 U.S.C. 093GHW-2(X) (1), unless the authorization is terminated or revoked sooner. When diagnostic testing is negative, the possibility of a false negative result should be considered in the context of a patient's recent exposures and the presence of clinical signs and symptoms consistent with COVID-19. An individual without symptoms of COVID-19 and who is not shedding SARS-CoV-2 virus wo uld expect to have a negative (not detected) result in this assay.   CBC w/Diff/Platelet     Status: Abnormal   Collection Time: 09/23/21  2:03 PM  Result Value Ref Range   WBC 8.3 3.8 - 10.8 Thousand/uL   RBC 3.53 (L) 3.80 - 5.10 Million/uL   Hemoglobin 10.8 (L) 11.7 - 15.5 g/dL   HCT 32.8 (L) 35.0 - 45.0 %   MCV 92.9 80.0 - 100.0 fL   MCH 30.6 27.0 - 33.0 pg   MCHC 32.9 32.0 - 36.0 g/dL   RDW 12.1 11.0 - 15.0 %   Platelets 424 (H) 140 - 400 Thousand/uL   MPV 9.4 7.5 - 12.5 fL   Neutro Abs 4,590 1,500 - 7,800 cells/uL   Lymphs Abs 2,922 850 - 3,900 cells/uL   Absolute Monocytes 490 200 - 950 cells/uL   Eosinophils Absolute 257 15 - 500 cells/uL   Basophils Absolute 42 0 - 200 cells/uL   Neutrophils Relative % 55.3 %   Total Lymphocyte 35.2 %   Monocytes Relative 5.9 %   Eosinophils Relative 3.1 %   Basophils Relative 0.5 %  COMPLETE METABOLIC PANEL WITH GFR     Status: None   Collection Time: 09/23/21  2:03 PM   Result Value Ref Range   Glucose, Bld 88 65 - 139 mg/dL    Comment: .        Non-fasting reference interval .    BUN 10 7 - 25 mg/dL   Creat 0.85 0.50 - 0.99 mg/dL   eGFR 87 > OR = 60 mL/min/1.8m    Comment: The eGFR is based on the CKD-EPI 2021 equation. To calculate  the new eGFR from a previous Creatinine or Cystatin C result, go to https://www.kidney.org/professionals/ kdoqi/gfr%5Fcalculator    BUN/Creatinine Ratio NOT APPLICABLE 6 - 22 (calc)   Sodium 141 135 - 146 mmol/L   Potassium 3.7 3.5 - 5.3 mmol/L   Chloride 106 98 - 110 mmol/L   CO2 26 20 - 32 mmol/L   Calcium 9.4 8.6 - 10.2 mg/dL   Total Protein 6.6 6.1 - 8.1 g/dL   Albumin 3.9 3.6 - 5.1 g/dL   Globulin 2.7 1.9 - 3.7 g/dL (calc)   AG Ratio 1.4 1.0 - 2.5 (calc)   Total Bilirubin 0.3 0.2 - 1.2 mg/dL   Alkaline phosphatase (APISO) 74 31 - 125 U/L   AST 15 10 - 30 U/L   ALT 10 6 - 29 U/L  Lipase     Status: None   Collection Time: 09/23/21  2:03 PM  Result Value Ref Range   Lipase 28 7 - 60 U/L      Assessment and Plan :   PDMP not reviewed this encounter.  1. Clinical diagnosis of COVID-19   2. Body aches   3. Sinus congestion   4. Other migraine without status migrainosus, not intractable    Confirmation test pending for COVID-19.  Recommended taking Paxlovid as she has risk factors that qualify her for COVID antivirals. Deferred imaging  given clear cardiopulmonary exam, hemodynamically stable vital signs.  Otherwise, will manage COVID-19 with supportive care. Counseled patient on nature of COVID-19 including modes of transmission, diagnostic testing, management and supportive care.  Counseled patient on potential for adverse effects with medications prescribed/recommended today, strict ER and return-to-clinic precautions discussed, patient verbalized understanding.     Jaynee Eagles, [place] 10/10/21 [phone]

## 2021-10-11 ENCOUNTER — Other Ambulatory Visit: Payer: Self-pay | Admitting: Gastroenterology

## 2021-10-11 ENCOUNTER — Telehealth: Payer: Self-pay | Admitting: *Deleted

## 2021-10-11 DIAGNOSIS — K219 Gastro-esophageal reflux disease without esophagitis: Secondary | ICD-10-CM

## 2021-10-11 LAB — NOVEL CORONAVIRUS, NAA: SARS-CoV-2, NAA: DETECTED — AB

## 2021-10-11 LAB — SARS-COV-2, NAA 2 DAY TAT

## 2021-10-11 NOTE — Telephone Encounter (Signed)
Called pt, EGD rescheduled for 11/12/21 at 10:00am. Needs urine pregnancy test 11/10/21. Lab letter and new instructions mailed. Endo scheduler informed to reschedule procedure that was for 10/15/21.

## 2021-10-11 NOTE — Telephone Encounter (Signed)
Pt called in.  Has procedure on 10/15/2021.  Needs to reschedule due to Covid.

## 2021-10-20 DIAGNOSIS — M79671 Pain in right foot: Secondary | ICD-10-CM | POA: Diagnosis not present

## 2021-10-20 DIAGNOSIS — B9681 Helicobacter pylori [H. pylori] as the cause of diseases classified elsewhere: Secondary | ICD-10-CM

## 2021-10-20 DIAGNOSIS — M7661 Achilles tendinitis, right leg: Secondary | ICD-10-CM | POA: Diagnosis not present

## 2021-10-20 HISTORY — DX: Helicobacter pylori (H. pylori) as the cause of diseases classified elsewhere: B96.81

## 2021-10-26 NOTE — Progress Notes (Signed)
PATIENT: Tonya Love DOB: Jan 06, 1976  REASON FOR VISIT: follow up HISTORY FROM: patient Primary neurologist: Dr. Frances Furbish  HISTORY OF PRESENT ILLNESS: Today 10/26/21:  Tonya Love is a 46 year old female with a history of migraine headaches. She returns today for follow-up. She is currently taking Ajovy and gabapentin.  She states with the addition of gabapentin she is only had 1 headache this month.  States that she is not having to use Maxalt.  Denies any new symptoms.  Returns today for an evaluation.  04/20/21:Tonya Love is a 46 year old female with a history of migraine headaches.  She continues on Ajovy and Maxalt.  She reports that she is 2 weeks behind on Ajovy due to prior authorization.  She states that she is having sharp shooting pain in the temporal regions and across the forehead.  She reports some blurry vision when she has these episodes.  Reports that they are very brief and only last for seconds.  This is only been occurring in the last month.   05/05/20: Tonya Love is a 46 year old female with a history of migraine. She returns today for follow-up. She remains on Ajovy.  She reports that this controls her headache.  She typically gets headaches the week before the next dose is due.  She states that her headaches typically occur across the forehead.  She does have photophobia and phonophobia.  On occasion she will have nausea.  She reports that Maxalt continues to give her good benefit.  On occasion she will have to take a second dose.  HISTORY 11/04/2019: She reports that the Maxalt helps, some days better than others, she needs a prescription for Zofran as she is out.  Her migraine frequency has increased a little bit, she can have 2 or 3 migraines per week, they can last 24 to 36 hours.  She has not been on preventative medication in the past but has tried as needed medication in the past.  She has had some intermittent palpitations and chest discomfort, also  jitteriness.  This is not after taking Maxalt actually.  It can be when she is just sitting and she feels unwell, she has noticed on her health tracker watch that her pulse rate can go up to the 150s, yesterday she had a similar episode in her pulse rate was 158.  She has not seen her primary care physician or nurse practitioner yet for this.  She has not seen a cardiologist in the past.  She has not seen a correlation between her caffeine intake and her symptoms and also she had not skipped any meals.  She tries to hydrate well with water, drinks caffeine in the form of [facility] occasionally, not every day, does not like coffee.   The patient's allergies, current medications, family history, past medical history, past social history, past surgical history and problem list were reviewed and updated as appropriate.      REVIEW OF SYSTEMS: Out of a complete 14 system review of symptoms, the patient complains only of the following symptoms, and all other reviewed systems are negative.  See HPI  ALLERGIES: Allergies  Allergen Reactions   Codeine Nausea Only    HOME MEDICATIONS: Outpatient Medications Prior to Visit  Medication Sig Dispense Refill   AJOVY 225 MG/1.5ML SOAJ INJECT 225 MG INTO THE SKIN EVERY 30 DAYS 1.5 mL 11   cholestyramine (QUESTRAN) 4 g packet TAKE 1 PACKET MIXED WITH 4 OUNCES OF WATER EVERY DAY WITH  BREAKFAST TAKE OTHER MEDS 1 HOUR BEFORE OR 4 TO 6 HOURS LATER 60 each 2   diclofenac (VOLTAREN) 75 MG EC tablet Take 1 tablet (75 mg total) by mouth 2 (two) times daily. (Patient not taking: Reported on 10/04/2021) 14 tablet 0   gabapentin (NEURONTIN) 100 MG capsule Take 1 capsule (100 mg total) by mouth 2 (two) times daily. 180 capsule 1   ibuprofen (ADVIL) 200 MG tablet Take 400 mg by mouth every 8 (eight) hours as needed for headache or moderate pain.     ipratropium (ATROVENT) 0.03 % nasal spray Place 2 sprays into both nostrils 2 (two) times daily as needed for rhinitis.  (Patient not taking: Reported on 10/04/2021) 30 mL 0   methylPREDNISolone (MEDROL DOSEPAK) 4 MG TBPK tablet Take 4 mg by mouth See admin instructions. Tapered dose. Start date 10/07/21     nirmatrelvir & ritonavir (PAXLOVID, 300/100,) 20 x 150 MG & 10 x 100MG  TBPK Take 1 tablet by mouth 2 (two) times daily. 30 tablet 0   ondansetron (ZOFRAN-ODT) 4 MG disintegrating tablet Take 1 tablet (4 mg total) by mouth every 8 (eight) hours as needed for nausea or vomiting. 15 tablet 0   pantoprazole (PROTONIX) 40 MG tablet TAKE 1 TABLET(40 MG) BY MOUTH TWICE DAILY 60 tablet 5   rizatriptan (MAXALT-MLT) 10 MG disintegrating tablet Take 1 tablet (10 mg total) by mouth as needed for migraine. May repeat in 2 hours if needed, no more than 2 pills in 24 hours. (Patient not taking: Reported on 10/07/2021) 9 tablet 11   traMADol (ULTRAM) 50 MG tablet Take 1 tablet (50 mg total) by mouth every 6 (six) hours as needed. (Patient not taking: Reported on 10/04/2021) 15 tablet 0   Facility-Administered Medications Prior to Visit  Medication Dose Route Frequency Provider Last Rate Last Admin   ipratropium (ATROVENT) 0.03 % nasal spray 2 spray  2 spray Each Nare BID PRN 10/06/2021, FNP        PAST MEDICAL HISTORY: Past Medical History:  Diagnosis Date   Anxiety    Depression    GERD (gastroesophageal reflux disease)    Migraines    Pancreatitis     PAST SURGICAL HISTORY: Past Surgical History:  Procedure Laterality Date   BIOPSY  11/06/2019   Procedure: BIOPSY;  Surgeon: 11/08/2019, MD;  [facility];  Service: Endoscopy;;   CHOLECYSTECTOMY     COLONOSCOPY N/A 11/06/2019   Procedure: COLONOSCOPY;  Surgeon: 11/08/2019, diverticulosis in the sigmoid colon, minimal grade 2 hemorrhoids, otherwise normal.  Segmental biopsies benign.  Repeat colonoscopy in 10 years.   ESOPHAGOGASTRODUODENOSCOPY N/A 11/06/2019   Procedure: ESOPHAGOGASTRODUODENOSCOPY (EGD);  Surgeon: 11/08/2019, MD; mild  Schatzki's ring s/p dilation, abnormal distal esophageal mucosa with benign biopsies, small hiatal hernia, otherwise normal.   MALONEY DILATION N/A 11/06/2019   Procedure: 11/08/2019 DILATION;  Surgeon: Elease Hashimoto, MD;  [facility];  Service: Endoscopy;  Laterality: N/A;   TUBAL LIGATION      FAMILY HISTORY: Family History  Problem Relation Age of Onset   Hypertension Mother    Cancer Mother    Alcohol abuse Father    Alcohol abuse Paternal Grandfather    Depression Maternal Aunt    Depression Paternal Aunt    Colon cancer Neg Hx    Pancreatic cancer Neg Hx     SOCIAL HISTORY: Social History   Socioeconomic History   Marital status: Legally Separated  Spouse name: Not on file   Number of children: Not on file   Years of education: Not on file   Highest education level: Not on file  Occupational History   Not on file  Tobacco Use   Smoking status: Never   Smokeless tobacco: Never  Vaping Use   Vaping Use: Never used  Substance and Sexual Activity   Alcohol use: No   Drug use: No   Sexual activity: Not Currently  Other Topics Concern   Not on file  Social History Narrative   Not on file   Social Determinants of Health   Financial Resource Strain: Not on file  Food Insecurity: Not on file  Transportation Needs: Not on file  Physical Activity: Not on file  Stress: Not on file  Social Connections: Not on file  Intimate Partner Violence: Not on file      PHYSICAL EXAM  Vitals:   10/27/21 0924  BP: 119/76  Pulse: 80  Weight: 199 lb 12.8 oz (90.6 kg)  Height: 5\' 1"  (1.549 m)    Body mass index is 37.75 kg/m.  Generalized: Well developed, in no acute distress   Neurological examination  Mentation: Alert oriented to time, place, history taking. Follows all commands speech and language fluent Cranial nerve II-XII: Pupils were equal round reactive to light. Extraocular movements were full, visual field were full on confrontational test..  Head turning and shoulder shrug  were normal and symmetric. Motor: The motor testing reveals 5 over 5 strength of all 4 extremities. Good symmetric motor tone is noted throughout.  Sensory: Sensory testing is intact to soft touch on all 4 extremities. No evidence of extinction is noted.  Coordination: Cerebellar testing reveals good finger-nose-finger and heel-to-shin bilaterally.  Gait and station: Gait is normal.  Reflexes: Deep tendon reflexes are symmetric and normal bilaterally.   DIAGNOSTIC DATA (LABS, IMAGING, TESTING) - I reviewed patient records, labs, notes, testing and imaging myself where available.  Lab Results  Component Value Date   WBC 8.3 09/23/2021   HGB 10.8 (L) 09/23/2021   HCT 32.8 (L) 09/23/2021   MCV 92.9 09/23/2021   PLT 424 (H) 09/23/2021      Component Value Date/Time   NA 141 09/23/2021 1403   NA 140 08/05/2019 1215   K 3.7 09/23/2021 1403   CL 106 09/23/2021 1403   CO2 26 09/23/2021 1403   GLUCOSE 88 09/23/2021 1403   BUN 10 09/23/2021 1403   BUN 7 08/05/2019 1215   CREATININE 0.85 09/23/2021 1403   CALCIUM 9.4 09/23/2021 1403   PROT 6.6 09/23/2021 1403   PROT 7.0 08/05/2019 1215   ALBUMIN 4.3 08/05/2019 1215   AST 15 09/23/2021 1403   ALT 10 09/23/2021 1403   ALKPHOS 72 08/05/2019 1215   BILITOT 0.3 09/23/2021 1403   BILITOT 0.3 08/05/2019 1215   GFRNONAA >60 06/30/2021 1457   GFRNONAA 73 08/26/2019 1551   GFRAA 85 08/26/2019 1551    Lab Results  Component Value Date   TSH 0.815 06/30/2021      ASSESSMENT AND PLAN 46 y.o. year old female  has a past medical history of Anxiety, Depression, GERD (gastroesophageal reflux disease), Migraines, and Pancreatitis. here with:  1.  Migraine headaches  Continue Ajovy Continue gabapentin 100 mg BID Advised if headaches worsen or she develops new symptoms she should let 59 know. Follow-up in 1 year or sooner if needed    [place], MSN, NP-C 10/26/2021, 2:49 PM [facility] [phone]  [address], [place], [place] [address] [phone]

## 2021-10-27 ENCOUNTER — Ambulatory Visit (INDEPENDENT_AMBULATORY_CARE_PROVIDER_SITE_OTHER): Payer: BC Managed Care – PPO | Admitting: Adult Health

## 2021-10-27 ENCOUNTER — Encounter: Payer: Self-pay | Admitting: Adult Health

## 2021-10-27 VITALS — BP 119/76 | HR 80 | Ht 61.0 in | Wt 199.8 lb

## 2021-10-27 DIAGNOSIS — G43709 Chronic migraine without aura, not intractable, without status migrainosus: Secondary | ICD-10-CM

## 2021-10-27 NOTE — Patient Instructions (Addendum)
Continue Ajovy Continue gabapentin

## 2021-11-03 ENCOUNTER — Ambulatory Visit (HOSPITAL_COMMUNITY): Admission: RE | Admit: 2021-11-03 | Payer: BC Managed Care – PPO | Source: Ambulatory Visit

## 2021-11-04 ENCOUNTER — Other Ambulatory Visit: Payer: Self-pay

## 2021-11-04 ENCOUNTER — Ambulatory Visit (HOSPITAL_COMMUNITY)
Admission: RE | Admit: 2021-11-04 | Discharge: 2021-11-04 | Disposition: A | Discharge status: Home / Self Care | Payer: BC Managed Care – PPO | Source: Ambulatory Visit | Attending: Cardiology | Admitting: Cardiology

## 2021-11-04 DIAGNOSIS — R0602 Shortness of breath: Secondary | ICD-10-CM | POA: Diagnosis not present

## 2021-11-04 LAB — ECHOCARDIOGRAM COMPLETE
Area-P 1/2: 3.48 cm2
S' Lateral: 2.5 cm

## 2021-11-04 NOTE — Progress Notes (Signed)
*  PRELIMINARY RESULTS* Echocardiogram 2D Echocardiogram has been performed.  Stacey Drain 11/04/2021, 9:16 AM

## 2021-11-10 ENCOUNTER — Other Ambulatory Visit (HOSPITAL_COMMUNITY)
Admission: RE | Admit: 2021-11-10 | Discharge: 2021-11-10 | Disposition: A | Discharge status: Home / Self Care | Payer: BC Managed Care – PPO | Source: Ambulatory Visit | Attending: Internal Medicine | Admitting: Internal Medicine

## 2021-11-10 ENCOUNTER — Telehealth: Payer: Self-pay

## 2021-11-10 DIAGNOSIS — M7661 Achilles tendinitis, right leg: Secondary | ICD-10-CM | POA: Diagnosis not present

## 2021-11-10 DIAGNOSIS — K449 Diaphragmatic hernia without obstruction or gangrene: Secondary | ICD-10-CM | POA: Diagnosis not present

## 2021-11-10 DIAGNOSIS — R1012 Left upper quadrant pain: Secondary | ICD-10-CM | POA: Insufficient documentation

## 2021-11-10 DIAGNOSIS — R112 Nausea with vomiting, unspecified: Secondary | ICD-10-CM | POA: Insufficient documentation

## 2021-11-10 DIAGNOSIS — K219 Gastro-esophageal reflux disease without esophagitis: Secondary | ICD-10-CM | POA: Diagnosis not present

## 2021-11-10 DIAGNOSIS — K222 Esophageal obstruction: Secondary | ICD-10-CM | POA: Diagnosis not present

## 2021-11-10 DIAGNOSIS — F419 Anxiety disorder, unspecified: Secondary | ICD-10-CM | POA: Diagnosis not present

## 2021-11-10 DIAGNOSIS — M79671 Pain in right foot: Secondary | ICD-10-CM | POA: Diagnosis not present

## 2021-11-10 DIAGNOSIS — K3189 Other diseases of stomach and duodenum: Secondary | ICD-10-CM | POA: Diagnosis not present

## 2021-11-10 DIAGNOSIS — F32A Depression, unspecified: Secondary | ICD-10-CM | POA: Diagnosis not present

## 2021-11-10 DIAGNOSIS — Z79899 Other long term (current) drug therapy: Secondary | ICD-10-CM | POA: Diagnosis not present

## 2021-11-10 DIAGNOSIS — K2950 Unspecified chronic gastritis without bleeding: Secondary | ICD-10-CM | POA: Diagnosis not present

## 2021-11-10 LAB — PREGNANCY, URINE: Preg Test, Ur: NEGATIVE

## 2021-11-10 NOTE — Telephone Encounter (Signed)
PA for Ajovy has been approved.  This request has received a Favorable outcome.  Please note any additional information provided by [facility] at the bottom of your screen. VALID 11/10/2021 - 11/10/2022  Pharmacy notified of PA approval.

## 2021-11-10 NOTE — Telephone Encounter (Signed)
PA for ajovy has been sent.  (Key: [id number])  Your information has been sent to [facility].  PA Case ID: [id number]

## 2021-11-12 ENCOUNTER — Ambulatory Visit (HOSPITAL_COMMUNITY): Payer: BC Managed Care – PPO | Admitting: Anesthesiology

## 2021-11-12 ENCOUNTER — Encounter (HOSPITAL_COMMUNITY): Payer: Self-pay

## 2021-11-12 ENCOUNTER — Encounter (HOSPITAL_COMMUNITY)
Admission: RE | Disposition: A | Discharge status: Home / Self Care | Payer: Self-pay | Source: Home / Self Care | Attending: Internal Medicine

## 2021-11-12 ENCOUNTER — Ambulatory Visit (HOSPITAL_COMMUNITY)
Admission: RE | Admit: 2021-11-12 | Discharge: 2021-11-12 | Disposition: A | Discharge status: Home / Self Care | Payer: BC Managed Care – PPO | Attending: Internal Medicine | Admitting: Internal Medicine

## 2021-11-12 ENCOUNTER — Other Ambulatory Visit: Payer: Self-pay

## 2021-11-12 DIAGNOSIS — K222 Esophageal obstruction: Secondary | ICD-10-CM | POA: Insufficient documentation

## 2021-11-12 DIAGNOSIS — R1310 Dysphagia, unspecified: Secondary | ICD-10-CM

## 2021-11-12 DIAGNOSIS — F419 Anxiety disorder, unspecified: Secondary | ICD-10-CM | POA: Diagnosis not present

## 2021-11-12 DIAGNOSIS — R1012 Left upper quadrant pain: Secondary | ICD-10-CM | POA: Diagnosis not present

## 2021-11-12 DIAGNOSIS — K2950 Unspecified chronic gastritis without bleeding: Secondary | ICD-10-CM | POA: Insufficient documentation

## 2021-11-12 DIAGNOSIS — F32A Depression, unspecified: Secondary | ICD-10-CM | POA: Insufficient documentation

## 2021-11-12 DIAGNOSIS — K449 Diaphragmatic hernia without obstruction or gangrene: Secondary | ICD-10-CM | POA: Insufficient documentation

## 2021-11-12 DIAGNOSIS — K219 Gastro-esophageal reflux disease without esophagitis: Secondary | ICD-10-CM | POA: Diagnosis not present

## 2021-11-12 DIAGNOSIS — Z79899 Other long term (current) drug therapy: Secondary | ICD-10-CM | POA: Diagnosis not present

## 2021-11-12 DIAGNOSIS — F418 Other specified anxiety disorders: Secondary | ICD-10-CM | POA: Diagnosis not present

## 2021-11-12 DIAGNOSIS — K3189 Other diseases of stomach and duodenum: Secondary | ICD-10-CM | POA: Diagnosis not present

## 2021-11-12 DIAGNOSIS — A048 Other specified bacterial intestinal infections: Secondary | ICD-10-CM | POA: Diagnosis not present

## 2021-11-12 DIAGNOSIS — R112 Nausea with vomiting, unspecified: Secondary | ICD-10-CM | POA: Diagnosis not present

## 2021-11-12 HISTORY — PX: ESOPHAGOGASTRODUODENOSCOPY (EGD) WITH PROPOFOL: SHX5813

## 2021-11-12 HISTORY — PX: BIOPSY: SHX5522

## 2021-11-12 SURGERY — ESOPHAGOGASTRODUODENOSCOPY (EGD) WITH PROPOFOL
Anesthesia: General

## 2021-11-12 MED ORDER — PROPOFOL 10 MG/ML IV BOLUS
INTRAVENOUS | Status: DC | PRN
Start: 2021-11-12 — End: 2021-11-12
  Administered 2021-11-12: 30 mg via INTRAVENOUS
  Administered 2021-11-12: 110 mg via INTRAVENOUS
  Administered 2021-11-12: 30 mg via INTRAVENOUS

## 2021-11-12 MED ORDER — LIDOCAINE HCL (CARDIAC) PF 100 MG/5ML IV SOSY
PREFILLED_SYRINGE | INTRAVENOUS | Status: DC | PRN
Start: 2021-11-12 — End: 2021-11-12
  Administered 2021-11-12: 50 mg via INTRAVENOUS

## 2021-11-12 MED ORDER — LACTATED RINGERS IV SOLN: INTRAVENOUS | Status: DC

## 2021-11-12 NOTE — Op Note (Signed)
[facility] Patient Name: Tonya Love Procedure Date: 11/12/2021 10:06 AM MRN: [id number] Date of Birth: April 08, 1976 Attending MD: Elon Alas. Abbey Chatters DO CSN: [id number] Age: 46 Admit Type: Outpatient Procedure:                Upper GI endoscopy Indications:              Abdominal pain in the left upper quadrant,                            Dysphagia, Nausea with vomiting Providers:                Elon Alas. Abbey Chatters, DO, Lambert Mody, Aram Candela Referring MD:              Medicines:                See the Anesthesia note for documentation of the                            administered medications Complications:            No immediate complications. Estimated Blood Loss:     Estimated blood loss was minimal. Procedure:                Pre-Anesthesia Assessment:                           - The anesthesia plan was to use monitored                            anesthesia care (MAC).                           After obtaining informed consent, the endoscope was                            passed under direct vision. Throughout the                            procedure, the patient's blood pressure, pulse, and                            oxygen saturations were monitored continuously. The                            GIF-H190 ([id number]) scope was introduced through the                            mouth, and advanced to the second part of duodenum.                            The upper GI endoscopy was accomplished without                            difficulty. The patient tolerated the  procedure                            well. Scope In: 10:21:51 AM Scope Out: 10:26:17 AM Total Procedure Duration: 0 hours 4 minutes 26 seconds  Findings:      A small hiatal hernia was present.      A mild Schatzki ring was found in the distal esophagus. A TTS dilator       was passed through the scope. Dilation with an 18-19-20 mm balloon       dilator was performed to 20  mm. The dilation site was examined and       showed moderate improvement in luminal narrowing.      Biopsies were taken with a cold forceps in the middle third of the       esophagus for histology.      Localized mildly erythematous mucosa without bleeding was found in the       gastric antrum. Biopsies were taken with a cold forceps for Helicobacter       pylori testing.      The duodenal bulb, first portion of the duodenum and second portion of       the duodenum were normal. Impression:               - Small hiatal hernia.                           - Mild Schatzki ring. Dilated.                           - Erythematous mucosa in the antrum. Biopsied.                           - Normal duodenal bulb, first portion of the                            duodenum and second portion of the duodenum.                           - Biopsies were taken with a cold forceps for                            histology in the middle third of the esophagus. Moderate Sedation:      Per Anesthesia Care Recommendation:           - Patient has a contact number available for                            emergencies. The signs and symptoms of potential                            delayed complications were discussed with the                            patient. Return to normal activities tomorrow.                            Written discharge instructions were  provided to the                            patient.                           - Resume previous diet.                           - Continue present medications.                           - Await pathology results.                           - Return to GI clinic in 3 months.                           - Use a proton pump inhibitor PO BID. Procedure Code(s):        --- Professional ---                           [phone], Esophagogastroduodenoscopy, flexible,                            transoral; with transendoscopic balloon dilation of                             esophagus (less than 30 mm diameter)                           43239, 59, Esophagogastroduodenoscopy, flexible,                            transoral; with biopsy, single or multiple Diagnosis Code(s):        --- Professional ---                           K44.9, Diaphragmatic hernia without obstruction or                            gangrene                           K22.2, Esophageal obstruction                           K31.89, Other diseases of stomach and duodenum                           R10.12, Left upper quadrant pain                           R13.10, Dysphagia, unspecified                           R11.2, Nausea with vomiting, unspecified CPT copyright 2019 American Medical Association. All rights reserved. The codes documented in this report  are preliminary and upon coder review may  be revised to meet current compliance requirements. Elon Alas. Abbey Chatters, DO McNeal Abbey Chatters, DO 11/12/2021 10:29:44 AM This report has been signed electronically. Number of Addenda: 0

## 2021-11-12 NOTE — Transfer of Care (Signed)
Immediate Anesthesia Transfer of Care Note  Patient: Tonya Love  Procedure(s) Performed: ESOPHAGOGASTRODUODENOSCOPY (EGD) WITH PROPOFOL  Patient Location: Short Stay  Anesthesia Type:General  Level of Consciousness: awake  Airway & Oxygen Therapy: Patient Spontanous Breathing  Post-op Assessment: Report given to RN and Post -op Vital signs reviewed and stable  Post vital signs: Reviewed and stable  Last Vitals:  Vitals Value Taken Time  BP    Temp    Pulse    Resp    SpO2      Last Pain:  Vitals:   11/12/21 1019  TempSrc:   PainSc: 6       Patients Stated Pain Goal: 8 (11/12/21 1005)  Complications: No notable events documented.

## 2021-11-12 NOTE — Anesthesia Preprocedure Evaluation (Addendum)
Anesthesia Evaluation  Patient identified by MRN, date of birth, ID band Patient awake    Reviewed: Allergy & Precautions, NPO status , Patient's Chart, lab work & pertinent test results  Airway Mallampati: II  TM Distance: >3 FB Neck ROM: Full    Dental  (+) Dental Advisory Given, Teeth Intact   Pulmonary sleep apnea (??) ,    Pulmonary exam normal breath sounds clear to auscultation       Cardiovascular negative cardio ROS Normal cardiovascular exam Rhythm:Regular Rate:Normal     Neuro/Psych  Headaches, PSYCHIATRIC DISORDERS Anxiety Depression    GI/Hepatic Neg liver ROS, GERD  Medicated and Controlled,  Endo/Other  negative endocrine ROS  Renal/GU negative Renal ROS  negative genitourinary   Musculoskeletal negative musculoskeletal ROS (+)   Abdominal   Peds negative pediatric ROS (+)  Hematology  (+) Blood dyscrasia, anemia ,   Anesthesia Other Findings   Reproductive/Obstetrics negative OB ROS                            Anesthesia Physical Anesthesia Plan  ASA: 2  Anesthesia Plan: General   Post-op Pain Management: Minimal or no pain anticipated   Induction: Intravenous  PONV Risk Score and Plan:   Airway Management Planned: Nasal Cannula and Natural Airway  Additional Equipment:   Intra-op Plan:   Post-operative Plan:   Informed Consent: I have reviewed the patients History and Physical, chart, labs and discussed the procedure including the risks, benefits and alternatives for the proposed anesthesia with the patient or authorized representative who has indicated his/her understanding and acceptance.     Dental advisory given  Plan Discussed with: CRNA and Surgeon  Anesthesia Plan Comments:         Anesthesia Quick Evaluation

## 2021-11-12 NOTE — Discharge Instructions (Addendum)
EGD Discharge instructions Please read the instructions outlined below and refer to this sheet in the next few weeks. These discharge instructions provide you with general information on caring for yourself after you leave the hospital. Your doctor may also give you specific instructions. While your treatment has been planned according to the most current medical practices available, unavoidable complications occasionally occur. If you have any problems or questions after discharge, please call your doctor. ACTIVITY You may resume your regular activity but move at a slower pace for the next 24 hours.  Take frequent rest periods for the next 24 hours.  Walking will help expel (get rid of) the air and reduce the bloated feeling in your abdomen.  No driving for 24 hours (because of the anesthesia (medicine) used during the test).  You may shower.  Do not sign any important legal documents or operate any machinery for 24 hours (because of the anesthesia used during the test).  NUTRITION Drink plenty of fluids.  You may resume your normal diet.  Begin with a light meal and progress to your normal diet.  Avoid alcoholic beverages for 24 hours or as instructed by your caregiver.  MEDICATIONS You may resume your normal medications unless your caregiver tells you otherwise.  WHAT YOU CAN EXPECT TODAY You may experience abdominal discomfort such as a feeling of fullness or gas pains.  FOLLOW-UP Your doctor will discuss the results of your test with you.  SEEK IMMEDIATE MEDICAL ATTENTION IF ANY OF THE FOLLOWING OCCUR: Excessive nausea (feeling sick to your stomach) and/or vomiting.  Severe abdominal pain and distention (swelling).  Trouble swallowing.  Temperature over 101 F (37.8 C).  Rectal bleeding or vomiting of blood.    Your EGD revealed small hiatal hernia.  You also had a Schatzki's ring which I dilated with a balloon today.  I took biopsies of both the esophagus and stomach as well to  rule out infections/inflammation.  Continue on pantoprazole twice daily.  We will call you with biopsy results likely next week.  Follow-up with Baxter Hire in 3 months.  I hope you have a great rest of your week!  Hennie Duos. Marletta Lor, D.O. Gastroenterology and Hepatology [facility]

## 2021-11-12 NOTE — Anesthesia Postprocedure Evaluation (Signed)
Anesthesia Post Note  Patient: Tonya Love  Procedure(s) Performed: ESOPHAGOGASTRODUODENOSCOPY (EGD) WITH PROPOFOL Balloon dilation wire-guided BIOPSY  Patient location during evaluation: Endoscopy Anesthesia Type: General Level of consciousness: awake and alert and oriented Pain management: pain level controlled Vital Signs Assessment: post-procedure vital signs reviewed and stable Respiratory status: spontaneous breathing, nonlabored ventilation and respiratory function stable Cardiovascular status: blood pressure returned to baseline and stable Postop Assessment: no apparent nausea or vomiting Anesthetic complications: no   No notable events documented.   Last Vitals:  Vitals:   11/12/21 1029 11/12/21 1034  BP: (!) 87/57 (!) 95/57  Pulse:    Resp: (!) 21   Temp: 36.6 C   SpO2: 95%     Last Pain:  Vitals:   11/12/21 1029  TempSrc: Oral  PainSc: 0-No pain                 Bobby Barton C Greysin Medlen

## 2021-11-12 NOTE — H&P (Signed)
Primary Care Physician:  Pcp, No Primary Gastroenterologist:  Dr. Marletta Lor  Pre-Procedure History & Physical: HPI:  Tonya Love is a 46 y.o. female is here for an EGD for upper abdominal pain, nausea, and vomiting  Past Medical History:  Diagnosis Date   Anxiety    Depression    GERD (gastroesophageal reflux disease)    Migraines    Pancreatitis     Past Surgical History:  Procedure Laterality Date   BIOPSY  11/06/2019   Procedure: BIOPSY;  Surgeon: Corbin Ade, MD;  Location: [facility];  Service: Endoscopy;;   CHOLECYSTECTOMY     COLONOSCOPY N/A 11/06/2019   Procedure: COLONOSCOPY;  Surgeon: Corbin Ade, diverticulosis in the sigmoid colon, minimal grade 2 hemorrhoids, otherwise normal.  Segmental biopsies benign.  Repeat colonoscopy in 10 years.   ESOPHAGOGASTRODUODENOSCOPY N/A 11/06/2019   Procedure: ESOPHAGOGASTRODUODENOSCOPY (EGD);  Surgeon: Corbin Ade, MD; mild Schatzki's ring s/p dilation, abnormal distal esophageal mucosa with benign biopsies, small hiatal hernia, otherwise normal.   MALONEY DILATION N/A 11/06/2019   Procedure: Elease Hashimoto DILATION;  Surgeon: Corbin Ade, MD;  Location: [facility];  Service: Endoscopy;  Laterality: N/A;   TUBAL LIGATION      Prior to Admission medications   Medication Sig Start Date End Date Taking? Authorizing Provider  AJOVY 225 MG/1.5ML SOAJ INJECT 225 MG INTO THE SKIN EVERY 30 DAYS 05/17/21  Yes [name], NP  cholestyramine (QUESTRAN) 4 g packet TAKE 1 PACKET MIXED WITH 4 OUNCES OF WATER EVERY DAY WITH BREAKFAST TAKE OTHER MEDS 1 HOUR BEFORE OR 4 TO 6 HOURS LATER 06/30/21  Yes Ermalinda Memos S, PA-C  gabapentin (NEURONTIN) 100 MG capsule Take 1 capsule (100 mg total) by mouth 2 (two) times daily. 09/22/21  Yes [name], NP  pantoprazole (PROTONIX) 40 MG tablet TAKE 1 TABLET(40 MG) BY MOUTH TWICE DAILY 10/13/21  Yes Ermalinda Memos S, PA-C  ibuprofen (ADVIL) 200 MG tablet Take 400 mg by mouth every 8 (eight)  hours as needed for headache or moderate pain.    [provider]  ipratropium (ATROVENT) 0.03 % nasal spray Place 2 sprays into both nostrils 2 (two) times daily as needed for rhinitis. 08/15/20   Bing Neighbors, FNP  ondansetron (ZOFRAN-ODT) 4 MG disintegrating tablet Take 1 tablet (4 mg total) by mouth every 8 (eight) hours as needed for nausea or vomiting. 09/01/21   Mardella Layman, MD  traMADol (ULTRAM) 50 MG tablet Take 1 tablet (50 mg total) by mouth every 6 (six) hours as needed. 09/01/21   Mardella Layman, MD  mirtazapine (REMERON) 30 MG tablet Take 1 tablet (30 mg total) by mouth at bedtime. 10/25/20 12/01/20  [name], MD    Allergies as of 10/05/2021 - Review Complete 10/04/2021  Allergen Reaction Noted   Codeine Nausea Only 12/25/2014    Family History  Problem Relation Age of Onset   Hypertension Mother    Cancer Mother    Alcohol abuse Father    Alcohol abuse Paternal Grandfather    Depression Maternal Aunt    Depression Paternal Aunt    Colon cancer Neg Hx    Pancreatic cancer Neg Hx     Social History   Socioeconomic History   Marital status: Legally Separated    Spouse name: Not on file   Number of children: Not on file   Years of education: Not on file   Highest education level: Not on file  Occupational History   Not on  file  Tobacco Use   Smoking status: Never   Smokeless tobacco: Never  Vaping Use   Vaping Use: Never used  Substance and Sexual Activity   Alcohol use: No   Drug use: No   Sexual activity: Not Currently  Other Topics Concern   Not on file  Social History Narrative   Not on file   Social Determinants of Health   Financial Resource Strain: Not on file  Food Insecurity: Not on file  Transportation Needs: Not on file  Physical Activity: Not on file  Stress: Not on file  Social Connections: Not on file  Intimate Partner Violence: Not on file    Review of Systems: See HPI, otherwise negative ROS  Physical  Exam: Vital signs in last 24 hours: Weight:  [89.8 kg] 89.8 kg (02/24 1005)   General:   Alert,  Well-developed, well-nourished, pleasant and cooperative in NAD Head:  Normocephalic and atraumatic. Eyes:  Sclera clear, no icterus.   Conjunctiva pink. Ears:  Normal auditory acuity. Nose:  No deformity, discharge,  or lesions. Mouth:  No deformity or lesions, dentition normal. Neck:  Supple; no masses or thyromegaly. Lungs:  Clear throughout to auscultation.   No wheezes, crackles, or rhonchi. No acute distress. Heart:  Regular rate and rhythm; no murmurs, clicks, rubs,  or gallops. Abdomen:  Soft, nontender and nondistended. No masses, hepatosplenomegaly or hernias noted. Normal bowel sounds, without guarding, and without rebound.   Msk:  Symmetrical without gross deformities. Normal posture. Extremities:  Without clubbing or edema. Neurologic:  Alert and  oriented x4;  grossly normal neurologically. Skin:  Intact without significant lesions or rashes. Cervical Nodes:  No significant cervical adenopathy. Psych:  Alert and cooperative. Normal mood and affect.  Impression/Plan: LENOX Tonya Love is here for an EGD for upper abdominal pain, nausea, and vomiting  The risks of the procedure including infection, bleed, or perforation as well as benefits, limitations, alternatives and imponderables have been reviewed with the patient. Questions have been answered. All parties agreeable.

## 2021-11-15 LAB — SURGICAL PATHOLOGY

## 2021-11-16 ENCOUNTER — Other Ambulatory Visit: Payer: Self-pay

## 2021-11-16 MED ORDER — AMOXICILLIN 500 MG PO TABS: 1000.0000 mg | ORAL_TABLET | Freq: Two times a day (BID) | ORAL | 0 refills | Status: AC

## 2021-11-16 MED ORDER — LANSOPRAZOLE 30 MG PO CPDR: 30.0000 mg | DELAYED_RELEASE_CAPSULE | Freq: Two times a day (BID) | ORAL | 0 refills | Status: DC

## 2021-11-16 MED ORDER — CLARITHROMYCIN 500 MG PO TABS: 500.0000 mg | ORAL_TABLET | Freq: Two times a day (BID) | ORAL | 0 refills | Status: AC

## 2021-11-18 ENCOUNTER — Encounter (HOSPITAL_COMMUNITY): Payer: Self-pay | Admitting: Internal Medicine

## 2021-11-30 ENCOUNTER — Other Ambulatory Visit: Payer: Self-pay

## 2021-11-30 ENCOUNTER — Ambulatory Visit: Payer: BC Managed Care – PPO

## 2021-11-30 DIAGNOSIS — R0683 Snoring: Secondary | ICD-10-CM

## 2021-11-30 DIAGNOSIS — G4733 Obstructive sleep apnea (adult) (pediatric): Secondary | ICD-10-CM

## 2021-12-02 ENCOUNTER — Telehealth: Payer: Self-pay | Admitting: Pulmonary Disease

## 2021-12-02 DIAGNOSIS — G4733 Obstructive sleep apnea (adult) (pediatric): Secondary | ICD-10-CM | POA: Diagnosis not present

## 2021-12-02 NOTE — Telephone Encounter (Signed)
HST 11/30/21 >> AHI 5.6, SpO2 low 88% ? ? ?Please inform her that her sleep study shows mild obstructive sleep apnea.  Please arrange for ROV with me or NP to discuss treatment options. ? ? ? ? ?

## 2021-12-02 NOTE — Telephone Encounter (Signed)
Called and went over results with patient. She voiced understanding. Scheduled for 12/13/21 in [place] office with [facility] NP. Nothing further needed. ?

## 2021-12-13 ENCOUNTER — Ambulatory Visit (INDEPENDENT_AMBULATORY_CARE_PROVIDER_SITE_OTHER): Payer: BC Managed Care – PPO | Admitting: Primary Care

## 2021-12-13 ENCOUNTER — Other Ambulatory Visit: Payer: Self-pay

## 2021-12-13 ENCOUNTER — Encounter: Payer: Self-pay | Admitting: Primary Care

## 2021-12-13 VITALS — BP 124/76 | HR 74 | Temp 98.6°F | Ht 62.0 in | Wt 194.6 lb

## 2021-12-13 DIAGNOSIS — R6881 Early satiety: Secondary | ICD-10-CM

## 2021-12-13 DIAGNOSIS — Z6835 Body mass index (BMI) 35.0-35.9, adult: Secondary | ICD-10-CM

## 2021-12-13 DIAGNOSIS — R0683 Snoring: Secondary | ICD-10-CM

## 2021-12-13 DIAGNOSIS — E669 Obesity, unspecified: Secondary | ICD-10-CM | POA: Diagnosis not present

## 2021-12-13 DIAGNOSIS — G4730 Sleep apnea, unspecified: Secondary | ICD-10-CM

## 2021-12-13 NOTE — Progress Notes (Signed)
Reviewed and agree with assessment/plan. ? ? ?Tecla Mailloux, MD ?[facility] ?12/13/2021, 12:12 PM ?Pager:  [phone] ? ?

## 2021-12-13 NOTE — Patient Instructions (Signed)
Home sleep study showed mild obstructive sleep apnea, you have on average 5.6 events an hour. Average oxygen was 96%  ? ?Work on weight loss ?Elevate head of bed at night 30 degrees with wedge pillow  ?Do not drive if tired  ? ?Referral: ?Orthodontics re: mild OSA ?Dr. Toni Arthurs [phone]  ? ?Follow-up: ?6 months with Dr. Craige Cotta or Waynetta Sandy NP  ? ?Sleep Apnea ?Sleep apnea affects breathing during sleep. It causes breathing to stop for 10 seconds or more, or to become shallow. People with sleep apnea usually snore loudly. ?It can also increase the risk of: ?Heart attack. ?Stroke. ?Being very overweight (obese). ?Diabetes. ?Heart failure. ?Irregular heartbeat. ?High blood pressure. ?The goal of treatment is to help you breathe normally again. ?What are the causes? ?The most common cause of this condition is a collapsed or blocked airway. ?There are three kinds of sleep apnea: ?Obstructive sleep apnea. This is caused by a blocked or collapsed airway. ?Central sleep apnea. This happens when the brain does not send the right signals to the muscles that control breathing. ?Mixed sleep apnea. This is a combination of obstructive and central sleep apnea. ?What increases the risk? ?Being overweight. ?Smoking. ?Having a small airway. ?Being older. ?Being female. ?Drinking alcohol. ?Taking medicines to calm yourself (sedatives or tranquilizers). ?Having family members with the condition. ?Having a tongue or tonsils that are larger than normal. ?What are the signs or symptoms? ?Trouble staying asleep. ?Loud snoring. ?Headaches in the morning. ?Waking up gasping. ?Dry mouth or sore throat in the morning. ?Being sleepy or tired during the day. ?If you are sleepy or tired during the day, you may also: ?Not be able to focus your mind (concentrate). ?Forget things. ?Get angry a lot and have mood swings. ?Feel sad (depressed). ?Have changes in your personality. ?Have less interest in sex, if you are female. ?Be unable to have an erection,  if you are female. ?How is this treated? ? ?Sleeping on your side. ?Using a medicine to get rid of mucus in your nose (decongestant). ?Avoiding the use of alcohol, medicines to help you relax, or certain pain medicines (narcotics). ?Losing weight, if needed. ?Changing your diet. ?Quitting smoking. ?Using a machine to open your airway while you sleep, such as: ?An oral appliance. This is a mouthpiece that shifts your lower jaw forward. ?A CPAP device. This device blows air through a mask when you breathe out (exhale). ?An EPAP device. This has valves that you put in each nostril. ?A BIPAP device. This device blows air through a mask when you breathe in (inhale) and breathe out. ?Having surgery if other treatments do not work. ?Follow these instructions at home: ?Lifestyle ?Make changes that your doctor recommends. ?Eat a healthy diet. ?Lose weight if needed. ?Avoid alcohol, medicines to help you relax, and some pain medicines. ?Do not smoke or use any products that contain nicotine or tobacco. If you need help quitting, ask your doctor. ?General instructions ?Take over-the-counter and prescription medicines only as told by your doctor. ?If you were given a machine to use while you sleep, use it only as told by your doctor. ?If you are having surgery, make sure to tell your doctor you have sleep apnea. You may need to bring your device with you. ?Keep all follow-up visits. ?Contact a doctor if: ?The machine that you were given to use during sleep bothers you or does not seem to be working. ?You do not get better. ?You get worse. ?Get help right away if: ?  Your chest hurts. ?You have trouble breathing in enough air. ?You have an uncomfortable feeling in your back, arms, or stomach. ?You have trouble talking. ?One side of your body feels weak. ?A part of your face is hanging down. ?These symptoms may be an emergency. Get help right away. Call your local emergency services (911 in the [place] ?Do not wait to see if the  symptoms will go away. ?Do not drive yourself to the hospital. ?Summary ?This condition affects breathing during sleep. ?The most common cause is a collapsed or blocked airway. ?The goal of treatment is to help you breathe normally while you sleep. ?This information is not intended to replace advice given to you by your health care provider. Make sure you discuss any questions you have with your health care provider. ?Document Revised: 04/14/2021 Document Reviewed: 08/14/2020 ?Elsevier Patient Education ? 2022 [employer] ? ? ? ?

## 2021-12-13 NOTE — Assessment & Plan Note (Signed)
-   Encourage small frequent meals ?- Following with GI  ?

## 2021-12-13 NOTE — Progress Notes (Signed)
? ?@Patient  ID: , female    DOB: 1976-06-09, 46 y.o.   MRN: [id number] ? ?Chief Complaint  ?Patient presents with  ? Follow-up  ?  Follow up for home sleep study results   ? ? ?Referring provider: ?No ref. provider found ? ?HPI: ?46 year old female.  Past medical history significant for GERD, early satiety, anemia, anxiety.  Patient of Dr. 59, last seen for initial sleep consult in November due to snoring. Epworth 15.  ? ?12/13/2021 ?Patient presents today to review sleep study results. Patent has symptoms of snoring. Patient had home sleep study on 11/30/2021 that showed mild obstructive sleep apnea, AHI 5.6 with SPO2 low 89% (average 96%).  We reviewed sleep study results, risks of untreated sleep apnea and treatment options.  Patient has elected to work on weight loss, positional sleep therapy and is open to consult with orthodontic dentist for possible oral appliance to treat OSA. Her weight has increased 30-40lbs over the last year or two. She will consider referral to healthy weight and wellness. She does not drink alcohol.  ? ? ?Allergies  ?Allergen Reactions  ? Codeine Nausea Only  ? ? ? ?There is no immunization history on file for this patient. ? ?Past Medical History:  ?Diagnosis Date  ? Anxiety   ? Depression   ? GERD (gastroesophageal reflux disease)   ? Migraines   ? Pancreatitis   ? ? ?Tobacco History: ?Social History  ? ?Tobacco Use  ?Smoking Status Never  ?Smokeless Tobacco Never  ? ?Counseling given: Not Answered ? ? ?Outpatient Medications Prior to Visit  ?Medication Sig Dispense Refill  ? AJOVY 225 MG/1.5ML SOAJ INJECT 225 MG INTO THE SKIN EVERY 30 DAYS 1.5 mL 11  ? cholestyramine (QUESTRAN) 4 g packet TAKE 1 PACKET MIXED WITH 4 OUNCES OF WATER EVERY DAY WITH BREAKFAST TAKE OTHER MEDS 1 HOUR BEFORE OR 4 TO 6 HOURS LATER 60 each 2  ? gabapentin (NEURONTIN) 100 MG capsule Take 1 capsule (100 mg total) by mouth 2 (two) times daily. 180 capsule 1  ? ibuprofen (ADVIL) 200 MG tablet  Take 400 mg by mouth every 8 (eight) hours as needed for headache or moderate pain.    ? ipratropium (ATROVENT) 0.03 % nasal spray Place 2 sprays into both nostrils 2 (two) times daily as needed for rhinitis. 30 mL 0  ? ondansetron (ZOFRAN-ODT) 4 MG disintegrating tablet Take 1 tablet (4 mg total) by mouth every 8 (eight) hours as needed for nausea or vomiting. 15 tablet 0  ? pantoprazole (PROTONIX) 40 MG tablet TAKE 1 TABLET(40 MG) BY MOUTH TWICE DAILY 60 tablet 5  ? traMADol (ULTRAM) 50 MG tablet Take 1 tablet (50 mg total) by mouth every 6 (six) hours as needed. 15 tablet 0  ? lansoprazole (PREVACID) 30 MG capsule Take 1 capsule (30 mg total) by mouth 2 (two) times daily before a meal for 14 days. 28 capsule 0  ? ?Facility-Administered Medications Prior to Visit  ?Medication Dose Route Frequency Provider Last Rate Last Admin  ? ipratropium (ATROVENT) 0.03 % nasal spray 2 spray  2 spray Each Nare BID PRN 12/02/2021, FNP      ? ?Review of Systems ? ?Review of Systems  ?Constitutional: Negative.   ?HENT: Negative.    ?Respiratory: Negative.    ? ? ?Physical Exam ? ?BP 124/76 (BP Location: Left Arm, Patient Position: Sitting, Cuff Size: Normal)   Pulse 74   Temp 98.6 ?F (37 ?C) (Oral)   Ht  5\' 2"  (1.575 m)   Wt 194 lb 9.6 oz (88.3 kg)   SpO2 98%   BMI 35.59 kg/m?  ?Physical Exam ?Constitutional:   ?   Appearance: Normal appearance.  ?HENT:  ?   Head: Normocephalic and atraumatic.  ?Cardiovascular:  ?   Rate and Rhythm: Normal rate and regular rhythm.  ?Pulmonary:  ?   Effort: Pulmonary effort is normal.  ?   Breath sounds: Normal breath sounds.  ?Musculoskeletal:     ?   General: Normal range of motion.  ?Skin: ?   General: Skin is warm and dry.  ?Neurological:  ?   General: No focal deficit present.  ?   Mental Status: She is alert and oriented to person, place, and time. Mental status is at baseline.  ?Psychiatric:     ?   Mood and Affect: Mood normal.     ?   Behavior: Behavior normal.     ?   Thought  Content: Thought content normal.     ?   Judgment: Judgment normal.  ?  ? ?Lab Results: ? ?CBC ?   ?Component Value Date/Time  ? WBC 8.3 09/23/2021 1403  ? RBC 3.53 (L) 09/23/2021 1403  ? HGB 10.8 (L) 09/23/2021 1403  ? HCT 32.8 (L) 09/23/2021 1403  ? PLT 424 (H) 09/23/2021 1403  ? MCV 92.9 09/23/2021 1403  ? MCH 30.6 09/23/2021 1403  ? MCHC 32.9 09/23/2021 1403  ? RDW 12.1 09/23/2021 1403  ? LYMPHSABS 2,922 09/23/2021 1403  ? MONOABS 0.6 01/23/2015 0158  ? EOSABS 257 09/23/2021 1403  ? BASOSABS 42 09/23/2021 1403  ? ? ?BMET ?   ?Component Value Date/Time  ? NA 141 09/23/2021 1403  ? NA 140 08/05/2019 1215  ? K 3.7 09/23/2021 1403  ? CL 106 09/23/2021 1403  ? CO2 26 09/23/2021 1403  ? GLUCOSE 88 09/23/2021 1403  ? BUN 10 09/23/2021 1403  ? BUN 7 08/05/2019 1215  ? CREATININE 0.85 09/23/2021 1403  ? CALCIUM 9.4 09/23/2021 1403  ? GFRNONAA >60 06/30/2021 1457  ? GFRNONAA 73 08/26/2019 1551  ? GFRAA 85 08/26/2019 1551  ? ? ?BNP ?No results found for: BNP ? ?ProBNP ?No results found for: PROBNP ? ?Imaging: ?No results found. ? ? ?Assessment & Plan:  ? ?Mild sleep apnea ?- Patient has symptoms of snoring. Epworth 15. ?- HST  11/30/2021 >> AHI 5.6 with SPO2 low 89%  ?- Reviewed sleep study results, risks of untreated sleep apnea and treatment options.  Patient has elected to work on weight loss, positional sleep therapy and is open to consult with orthodontics for possible oral appliance to treat OSA ?- Referral placed to Dr. 12/02/2021 for oral appliance  ?- Advised against drinking alcohol or taking sedating medication prior to bedtime and driving if experiencing excessive fatigue or daytime sleepiness ?- Follow-up in 6 months or sooner if needed ? ?Obesity ?- Encourage weight loss effort, patient will consider referral to healthy weight and wellness ? ?Early satiety ?- Encourage small frequent meals ?- Following with GI  ? ? ?Toni Arthurs, NP ?12/13/2021 ? ?

## 2021-12-13 NOTE — Assessment & Plan Note (Signed)
-   Encourage weight loss effort, patient will consider referral to healthy weight and wellness ?

## 2021-12-13 NOTE — Assessment & Plan Note (Addendum)
-   Patient has symptoms of snoring. Epworth 15. ?- HST  11/30/2021 >> AHI 5.6 with SPO2 low 89%  ?- Reviewed sleep study results, risks of untreated sleep apnea and treatment options.  Patient has elected to work on weight loss, positional sleep therapy and is open to consult with orthodontics for possible oral appliance to treat OSA ?- Referral placed to Dr. Toni Arthurs for oral appliance  ?- Advised against drinking alcohol or taking sedating medication prior to bedtime and driving if experiencing excessive fatigue or daytime sleepiness ?- Follow-up in 6 months or sooner if needed ?

## 2021-12-29 ENCOUNTER — Other Ambulatory Visit: Payer: Self-pay | Admitting: Gastroenterology

## 2021-12-29 DIAGNOSIS — R197 Diarrhea, unspecified: Secondary | ICD-10-CM

## 2021-12-29 NOTE — Telephone Encounter (Signed)
Phoned and advised the pt that her Rx has been sent in. 

## 2021-12-29 NOTE — Telephone Encounter (Signed)
Pt needs a refill o her cholestyramine. Her last ov was 09/03/2021 and her last refill was 10/26/2021. ?

## 2022-01-01 ENCOUNTER — Ambulatory Visit
Admission: RE | Admit: 2022-01-01 | Discharge: 2022-01-01 | Disposition: A | Discharge status: Home / Self Care | Payer: BC Managed Care – PPO | Source: Ambulatory Visit | Attending: Urgent Care | Admitting: Urgent Care

## 2022-01-01 VITALS — BP 113/79 | HR 99 | Temp 98.5°F | Resp 18

## 2022-01-01 DIAGNOSIS — H6982 Other specified disorders of Eustachian tube, left ear: Secondary | ICD-10-CM

## 2022-01-01 DIAGNOSIS — G5631 Lesion of radial nerve, right upper limb: Secondary | ICD-10-CM

## 2022-01-01 MED ORDER — LEVOCETIRIZINE DIHYDROCHLORIDE 5 MG PO TABS: 5.0000 mg | ORAL_TABLET | Freq: Every evening | ORAL | 3 refills | Status: DC

## 2022-01-01 MED ORDER — OLOPATADINE HCL 0.1 % OP SOLN: 1.0000 [drp] | Freq: Two times a day (BID) | OPHTHALMIC | 12 refills | Status: DC

## 2022-01-01 MED ORDER — PSEUDOEPHEDRINE HCL 60 MG PO TABS: 60.0000 mg | ORAL_TABLET | Freq: Three times a day (TID) | ORAL | 0 refills | Status: DC | PRN

## 2022-01-01 MED ORDER — FLUTICASONE PROPIONATE 50 MCG/ACT NA SUSP: 2.0000 | Freq: Every day | NASAL | 12 refills | Status: DC

## 2022-01-01 MED ORDER — NAPROXEN 500 MG PO TABS: 500.0000 mg | ORAL_TABLET | Freq: Two times a day (BID) | ORAL | 0 refills | Status: DC

## 2022-01-01 NOTE — ED Provider Notes (Signed)
?[facility] ? ? ?MRN: [id number] DOB: 18-Jul-1976 ? ?Subjective:  ? ?Tonya Love is a 46 y.o. female presenting for 1 day history of acute onset left ear pain.  Has also had scratchy throat, itchy mouth.  Reports that she has a history of significant allergies.  Takes an oral antihistamine over-the-counter.  No fever, sinus pain, ear drainage, tinnitus, dizziness, cough, chest pain, shortness of breath or wheezing.  She is also had persistent right forearm pain, weakness.  No fall, trauma.  She is very active and has to do a lot of lifting for work.  Reports that this is getting difficult due to the pain but also weakness that she feels.  No history of musculoskeletal disorders. ? ? ?Current Facility-Administered Medications:  ?  ipratropium (ATROVENT) 0.03 % nasal spray 2 spray, 2 spray, Each Nare, BID PRN, Bing Neighbors, FNP ? ?Current Outpatient Medications:  ?  AJOVY 225 MG/1.5ML SOAJ, INJECT 225 MG INTO THE SKIN EVERY 30 DAYS, Disp: 1.5 mL, Rfl: 11 ?  cholestyramine (QUESTRAN) 4 g packet, TAKE 1 PACKET MIXED WITH 4 OUNCES OF WATER EVERY DAY WITH BREAKFAST AND TAKE OTHER MEDS 4 TO 6 HOURS LATER, Disp: 90 each, Rfl: 1 ?  gabapentin (NEURONTIN) 100 MG capsule, Take 1 capsule (100 mg total) by mouth 2 (two) times daily., Disp: 180 capsule, Rfl: 1 ?  ibuprofen (ADVIL) 200 MG tablet, Take 400 mg by mouth every 8 (eight) hours as needed for headache or moderate pain., Disp: , Rfl:  ?  ipratropium (ATROVENT) 0.03 % nasal spray, Place 2 sprays into both nostrils 2 (two) times daily as needed for rhinitis., Disp: 30 mL, Rfl: 0 ?  lansoprazole (PREVACID) 30 MG capsule, Take 1 capsule (30 mg total) by mouth 2 (two) times daily before a meal for 14 days., Disp: 28 capsule, Rfl: 0 ?  ondansetron (ZOFRAN-ODT) 4 MG disintegrating tablet, Take 1 tablet (4 mg total) by mouth every 8 (eight) hours as needed for nausea or vomiting., Disp: 15 tablet, Rfl: 0 ?  pantoprazole (PROTONIX) 40 MG tablet, TAKE 1  TABLET(40 MG) BY MOUTH TWICE DAILY, Disp: 60 tablet, Rfl: 5 ?  traMADol (ULTRAM) 50 MG tablet, Take 1 tablet (50 mg total) by mouth every 6 (six) hours as needed., Disp: 15 tablet, Rfl: 0  ? ?Allergies  ?Allergen Reactions  ? Codeine Nausea Only  ? ? ?Past Medical History:  ?Diagnosis Date  ? Anxiety   ? Depression   ? GERD (gastroesophageal reflux disease)   ? Migraines   ? Pancreatitis   ?  ? ?Past Surgical History:  ?Procedure Laterality Date  ? BIOPSY  11/06/2019  ? Procedure: BIOPSY;  Surgeon: Corbin Ade, MD;  Location: [facility];  Service: Endoscopy;;  ? BIOPSY  11/12/2021  ? Procedure: BIOPSY;  Surgeon: Lanelle Bal, DO;  Location: [facility];  Service: Endoscopy;;  ? CHOLECYSTECTOMY    ? COLONOSCOPY N/A 11/06/2019  ? Procedure: COLONOSCOPY;  Surgeon: Corbin Ade, diverticulosis in the sigmoid colon, minimal grade 2 hemorrhoids, otherwise normal.  Segmental biopsies benign.  Repeat colonoscopy in 10 years.  ? ESOPHAGOGASTRODUODENOSCOPY N/A 11/06/2019  ? Procedure: ESOPHAGOGASTRODUODENOSCOPY (EGD);  Surgeon: Corbin Ade, MD; mild Schatzki's ring s/p dilation, abnormal distal esophageal mucosa with benign biopsies, small hiatal hernia, otherwise normal.  ? ESOPHAGOGASTRODUODENOSCOPY (EGD) WITH PROPOFOL N/A 11/12/2021  ? Procedure: ESOPHAGOGASTRODUODENOSCOPY (EGD) WITH PROPOFOL;  Surgeon: Lanelle Bal, DO;  [facility]: [facility];  Service: Endoscopy;  Laterality:  N/A;  12:45pm  ? MALONEY DILATION N/A 11/06/2019  ? Procedure: MALONEY DILATION;  Surgeon: Corbin Ade, MD;  [facility]: [facility];  Service: Endoscopy;  Laterality: N/A;  ? TUBAL LIGATION    ? ? ?Family History  ?Problem Relation Age of Onset  ? Hypertension Mother   ? Cancer Mother   ? Alcohol abuse Father   ? Alcohol abuse Paternal Grandfather   ? Depression Maternal Aunt   ? Depression Paternal Aunt   ? Colon cancer Neg Hx   ? Pancreatic cancer Neg Hx   ? ? ?Social History  ? ?Tobacco Use  ? Smoking status: Never  ?  Smokeless tobacco: Never  ?Vaping Use  ? Vaping Use: Never used  ?Substance Use Topics  ? Alcohol use: No  ? Drug use: No  ? ? ?ROS ? ? ?Objective:  ? ?Vitals: ?BP 113/79   Pulse 99   Temp 98.5 ?F (36.9 ?C)   Resp 18   LMP 12/11/2021 (Approximate)   SpO2 96%  ? ?Physical Exam ?Constitutional:   ?   General: She is not in acute distress. ?   Appearance: Normal appearance. She is well-developed and normal weight. She is not ill-appearing, toxic-appearing or diaphoretic.  ?HENT:  ?   Head: Normocephalic and atraumatic.  ?   Right Ear: Tympanic membrane, ear canal and external ear normal. No drainage or tenderness. No middle ear effusion. There is no impacted cerumen. Tympanic membrane is not erythematous.  ?   Left Ear: Tympanic membrane, ear canal and external ear normal. No drainage or tenderness.  No middle ear effusion. There is no impacted cerumen. Tympanic membrane is not erythematous.  ?   Nose: No congestion or rhinorrhea.  ?   Mouth/Throat:  ?   Mouth: Mucous membranes are moist. No oral lesions.  ?   Pharynx: No pharyngeal swelling, oropharyngeal exudate, posterior oropharyngeal erythema or uvula swelling.  ?   Tonsils: No tonsillar exudate or tonsillar abscesses.  ?Eyes:  ?   General: No scleral icterus.    ?   Right eye: No discharge.     ?   Left eye: No discharge.  ?   Extraocular Movements: Extraocular movements intact.  ?   Right eye: Normal extraocular motion.  ?   Left eye: Normal extraocular motion.  ?   Conjunctiva/sclera: Conjunctivae normal.  ?Cardiovascular:  ?   Rate and Rhythm: Normal rate.  ?Pulmonary:  ?   Effort: Pulmonary effort is normal.  ?Musculoskeletal:  ?     Arms: ? ?   Cervical back: Normal range of motion and neck supple.  ?   Comments: Full range of motion for the upper extremities.  Strength 5/5 for the left, slightly decreased for the right at 4/5.   ?Lymphadenopathy:  ?   Cervical: No cervical adenopathy.  ?Skin: ?   General: Skin is warm and dry.  ?Neurological:  ?    General: No focal deficit present.  ?   Mental Status: She is alert and oriented to person, place, and time.  ?Psychiatric:     ?   Mood and Affect: Mood normal.     ?   Behavior: Behavior normal.  ? ? ?Assessment and Plan :  ? ?PDMP not reviewed this encounter. ? ?1. Eustachian tube dysfunction, left   ?2. Radial nerve disease, right   ? ?Unremarkable ENT exam.  Will use conservative management for what I suspect is eustachian tube dysfunction.  Recommended starting Flonase, Xyzal,  Sudafed.  At discharge, patient also reported that she has had very itchy eyes and therefore I prescribed olopatadine eyedrops to help with allergic conjunctivitis as well.  I suspect a radial nerve problem with respect to her right forearm pain, weakness.  Recommended conservative management with naproxen, rest.  Advised that she follow-up for further work-up with Dr. Romeo Apple.  As there is lack of trauma, physical exam findings warranting an x-ray we deferred imaging.  Counseled patient on potential for adverse effects with medications prescribed/recommended today, ER and return-to-clinic precautions discussed, patient verbalized understanding. ? ?  ?Wallis Bamberg, PA-C ?01/01/22 1221 ? ?

## 2022-01-01 NOTE — ED Triage Notes (Signed)
Pt presents with right arm pain for past 4 days. Denies injury but reports weakness and pain when attempting to lift objects ?

## 2022-01-01 NOTE — ED Triage Notes (Signed)
Pt  also reports left ear pain  ?

## 2022-01-07 ENCOUNTER — Ambulatory Visit (INDEPENDENT_AMBULATORY_CARE_PROVIDER_SITE_OTHER): Payer: BC Managed Care – PPO | Admitting: Orthopedic Surgery

## 2022-01-07 ENCOUNTER — Encounter: Payer: Self-pay | Admitting: Orthopedic Surgery

## 2022-01-07 VITALS — Ht 62.0 in | Wt 198.0 lb

## 2022-01-07 DIAGNOSIS — M7711 Lateral epicondylitis, right elbow: Secondary | ICD-10-CM

## 2022-01-07 NOTE — Patient Instructions (Addendum)
Take ibuprofen or naprosyn  ? ?Also apply ben gay 3 x a day  ? ?You have received an injection of steroids into the joint. 15% of patients will have increased pain within the 24 hours postinjection.  ? ?This is transient and will go away.  ? ?We recommend that you use ice packs on the injection site for 20 minutes every 2 hours and extra strength Tylenol 2 tablets every 8 as needed until the pain resolves. ? ?If you continue to have pain after taking the Tylenol and using the ice please call the office for further instructions. ? ?

## 2022-01-07 NOTE — Progress Notes (Signed)
NEW PROBLEM//OFFICE VISIT ? ? ?Chief Complaint  ?Patient presents with  ? Arm Pain  ?  Rt arm pain starting near elbow and shooting to fingers. States she has numbness/tingling on top of hand for 2 wks.  ? ?HPI ?46 year old female presented to urgent care a few days ago with right arm pain radiating to her right hand there was some concern she might have radial nerve injury but there was no mechanism for that.  The pain seems to be a dull ache which runs from the lateral epicondyle into the forearm and is associated with picking up things including power grip activities ? ?ROS: No neck pain no left upper extremity symptoms no history of carpal tunnel ?ROS ? ? ?Ht 5\' 2"  (1.575 m)   Wt 198 lb (89.8 kg)   LMP 12/11/2021 (Approximate)   BMI 36.21 kg/m?  ? ?Body mass index is 36.21 kg/m?. ? ?General appearance: Well-developed well-nourished no gross deformities ? ?Cardiovascular normal pulse and perfusion normal color without edema ? ?Neurologically no sensation loss or deficits or pathologic reflexes ? ?Psychological: Awake alert and oriented x3 mood and affect normal ? ?Skin no lacerations or ulcerations no nodularity no palpable masses, no erythema or nodularity ? ?Musculoskeletal: Tenderness over the lateral epicondyle and somewhat of the forearm question if there is radial tunnel syndrome as well but this would not present with motor function issues.  Provocative test for tennis elbow are positive including finger extension resistance test and wrist extension resistance test ? ? ? ? ? ?Past Medical History:  ?Diagnosis Date  ? Anxiety   ? Depression   ? GERD (gastroesophageal reflux disease)   ? Migraines   ? Pancreatitis   ? ? ?Past Surgical History:  ?Procedure Laterality Date  ? BIOPSY  11/06/2019  ? Procedure: BIOPSY;  Surgeon: Daneil Dolin, MD;  [facility]: [facility];  Service: Endoscopy;;  ? BIOPSY  11/12/2021  ? Procedure: BIOPSY;  Surgeon: Eloise Harman, DO;  Location: [facility];  Service:  Endoscopy;;  ? CHOLECYSTECTOMY    ? COLONOSCOPY N/A 11/06/2019  ? Procedure: COLONOSCOPY;  Surgeon: Daneil Dolin, diverticulosis in the sigmoid colon, minimal grade 2 hemorrhoids, otherwise normal.  Segmental biopsies benign.  Repeat colonoscopy in 10 years.  ? ESOPHAGOGASTRODUODENOSCOPY N/A 11/06/2019  ? Procedure: ESOPHAGOGASTRODUODENOSCOPY (EGD);  Surgeon: Daneil Dolin, MD; mild Schatzki's ring s/p dilation, abnormal distal esophageal mucosa with benign biopsies, small hiatal hernia, otherwise normal.  ? ESOPHAGOGASTRODUODENOSCOPY (EGD) WITH PROPOFOL N/A 11/12/2021  ? Procedure: ESOPHAGOGASTRODUODENOSCOPY (EGD) WITH PROPOFOL;  Surgeon: Eloise Harman, DO;  [facility];  Service: Endoscopy;  Laterality: N/A;  12:45pm  ? MALONEY DILATION N/A 11/06/2019  ? Procedure: MALONEY DILATION;  Surgeon: Daneil Dolin, MD;  [facility];  Service: Endoscopy;  Laterality: N/A;  ? TUBAL LIGATION    ? ? ?Family History  ?Problem Relation Age of Onset  ? Hypertension Mother   ? Cancer Mother   ? Alcohol abuse Father   ? Alcohol abuse Paternal Grandfather   ? Depression Maternal Aunt   ? Depression Paternal Aunt   ? Colon cancer Neg Hx   ? Pancreatic cancer Neg Hx   ? ?Social History  ? ?Tobacco Use  ? Smoking status: Never  ? Smokeless tobacco: Never  ?Vaping Use  ? Vaping Use: Never used  ?Substance Use Topics  ? Alcohol use: No  ? Drug use: No  ? ? ?Allergies  ?Allergen Reactions  ? Codeine  Nausea Only  ? ? ?Current Meds  ?Medication Sig  ? AJOVY 225 MG/1.5ML SOAJ INJECT 225 MG INTO THE SKIN EVERY 30 DAYS  ? cholestyramine (QUESTRAN) 4 g packet TAKE 1 PACKET MIXED WITH 4 OUNCES OF WATER EVERY DAY WITH BREAKFAST AND TAKE OTHER MEDS 4 TO 6 HOURS LATER  ? fluticasone (FLONASE) 50 MCG/ACT nasal spray Place 2 sprays into both nostrils daily.  ? gabapentin (NEURONTIN) 100 MG capsule Take 1 capsule (100 mg total) by mouth 2 (two) times daily.  ? ibuprofen (ADVIL) 200 MG tablet Take 400 mg by mouth every 8  (eight) hours as needed for headache or moderate pain.  ? ipratropium (ATROVENT) 0.03 % nasal spray Place 2 sprays into both nostrils 2 (two) times daily as needed for rhinitis.  ? levocetirizine (XYZAL) 5 MG tablet Take 1 tablet (5 mg total) by mouth every evening.  ? naproxen (NAPROSYN) 500 MG tablet Take 1 tablet (500 mg total) by mouth 2 (two) times daily with a meal.  ? olopatadine (PATANOL) 0.1 % ophthalmic solution Place 1 drop into both eyes 2 (two) times daily.  ? ondansetron (ZOFRAN-ODT) 4 MG disintegrating tablet Take 1 tablet (4 mg total) by mouth every 8 (eight) hours as needed for nausea or vomiting.  ? pantoprazole (PROTONIX) 40 MG tablet TAKE 1 TABLET(40 MG) BY MOUTH TWICE DAILY  ? pseudoephedrine (SUDAFED) 60 MG tablet Take 1 tablet (60 mg total) by mouth every 8 (eight) hours as needed for congestion.  ? traMADol (ULTRAM) 50 MG tablet Take 1 tablet (50 mg total) by mouth every 6 (six) hours as needed.  ? ?Current Facility-Administered Medications for the 01/07/22 encounter (Office Visit) with Carole Civil, MD  ?Medication  ? ipratropium (ATROVENT) 0.03 % nasal spray 2 spray  ? ? ? ?MEDICAL DECISION MAKING ? ?A.  ?Encounter Diagnosis  ?Name Primary?  ? Right tennis elbow Yes  ? ? ?B. DATA ANALYSED: ? ? ?IMAGING: ?Interpretation of images: I have personally reviewed the images and my interpretation is no imaging ?Orders: No new orders ? ?Outside records reviewed: Urgent care records ? ? ?C. MANAGEMENT  ? ?Recommend full court press which will include ?Tennis elbow injection ?Patient education ?Tennis elbow brace ?Topical medications ?Oral NSAID therapy ?Activity modification ?Cryotherapy ? ?Procedure note ?Procedure note injection for right tennis elbow  ? ?Diagnosis right tennis elbow ? ?Anesthesia ethyl chloride was used ?Alcohol use is clean the skin ? ?After we obtained verbal consent and timeout a 25-gauge needle was used to inject 40 mg of Depo-Medrol and 3 cc of 1% lidocaine just distal  to the insertion of the ECRB ? ?There were no complications and a sterile bandage was applied.  ? ?No orders of the defined types were placed in this encounter. ? ? ? ?Arther Abbott, MD ? ?01/10/2022 ?8:06 AM  ?

## 2022-01-14 ENCOUNTER — Ambulatory Visit: Payer: BC Managed Care – PPO | Admitting: Orthopedic Surgery

## 2022-02-11 ENCOUNTER — Telehealth: Payer: Self-pay | Admitting: Primary Care

## 2022-02-11 DIAGNOSIS — G4730 Sleep apnea, unspecified: Secondary | ICD-10-CM

## 2022-02-11 NOTE — Telephone Encounter (Signed)
Spoke with the Tonya Love  She states seen Dr Toni Arthurs and is not interested in trying dental appliance for mild OSA  She is asking if we can go ahead and order CPAP  Please advise, thanks!

## 2022-02-18 ENCOUNTER — Ambulatory Visit: Payer: BC Managed Care – PPO | Admitting: Orthopedic Surgery

## 2022-02-18 NOTE — Telephone Encounter (Signed)
Called pt and there was no answer-LMTCB  I went ahead and placed order for CPAP

## 2022-02-18 NOTE — Telephone Encounter (Signed)
Yes we can, please place order for auto cpap 5-15cm h20. Needs OV 31-90 days after receiving for compliance report

## 2022-02-22 ENCOUNTER — Encounter: Payer: Self-pay | Admitting: Gastroenterology

## 2022-02-22 NOTE — Progress Notes (Signed)
Referring Provider: No ref. provider found Primary Care Physician:  Pcp, No Primary GI Physician: Dr. Jena Gauss  Chief Complaint  Patient presents with   Follow-up    Poor appetite, feels nauseated after a couple of bites of food.     HPI:   Tonya Love is a 46 y.o. female presenting today for follow-up of GERD, diarrhea, LUQ abdominal pain and nausea.  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 difficile and GI pathogen panel negative in December 2020, toilet tissue hematochezia in the setting of hemorrhoids, mild anemia with iron panel on the low end of normal suspected to be influenced by menorrhagia. TCS and EGD February 2021 with diverticulosis in sigmoid colon, minimal grade 2 hemorrhoids, and benign random colon biopsies with recommendations to repeat in 10 years.  EGD with mild Schatzki ring s/p dilation, abnormal distal esophageal mucosa with benign biopsy, small hiatal hernia, otherwise normal exam. GES wnl in April 2021.  Celiac serologies negative. Thyroid function low in March 2021, but normal in October 2022. CT A/P with contrast 05/08/2020 with no significant findings.  Previously noted improvement in nausea/vomiting with changing PPI and avoiding dairy products.  Last seen in our office 09/03/2021.  GERD was well controlled on Protonix 40 mg twice daily.  Diarrhea was well controlled with Questran 4 g daily and avoiding dairy products.  Her primary concern was left flank/left back pain which started 3 weeks prior when she developed acute respiratory illness with cough.  Respiratory symptoms improving.  Only with occasional cough and mild shortness of breath with exertion, but continued with left-sided back pain at the lower rib cage radiating around to her left upper quadrant.  Symptoms  worsened by certain movements.  No association with meals or bowel movements and denied nausea, vomiting, urinary symptoms.  Previously prescribed Flexeril which was not helpful.  Recent unilateral rib/chest x-ray 2 days prior to office visit without significant abnormalities.  She started on diclofenac and tramadol per urgent care.  On exam, she had mild TTP in the epigastric and LUQ region, but increasing tenderness along the left flank and is of left black along lower ribs.  Suspected MSK etiology.  Recommended completing course of diclofenac and monitoring symptoms.  If persistent symptoms, consider further evaluation.  Patient called on 12/27 with persistent pain.  Labs completed 1/5 revealed hemoglobin of 10.8, kidney function, liver enzymes, electrolytes, WBC within normal limits.  CT A/P with contrast was completed on 1/10 revealing small nonobstructive left renal calculus without complicating findings.  No other acute findings.  On 1/11, patient reported postprandial nausea, intermittent vomiting, new onset watery diarrhea with nocturnal stools.  Recommended stool studies.  On 1/17, patient reported she was no longer having diarrhea, the stool studies not completed.  She continued with LUQ abdominal pain and nausea.  Recommended proceeding with EGD for further evaluation.  EGD 11/12/2021 revealing mild Schatzki's ring s/p dilation, biopsies taken from esophagus, small hiatal hernia, erythematous mucosa in the gastric antrum biopsied, normal examined duodenum.  Pathology with reactive squamous mucosa with increased intraepithelial lymphocytes and rare eosinophils in the esophagus, chronic active gastritis with H. pylori.  Recommended Prevpac x14 days.  Today:  Early satiety and lack of appetite x 3-4 months. Little nausea after eating 3-4 bites. No vomiting aside from when she had a stomach virus x 1 week  at the beginning of May. Prior LUQ abdominal has resolved. Had same symptoms prior to EGD.  Completed course of antibiotics for H pylori. Little improvement for a couple of weeks after abx.   Typical GERD is well controlled on Protonix 40 mg BID.   Diarrhea: Doing well with Questran. Occasional breakthrough. Still avoiding dairy. Wants ice cream. No brbpr or melena.    Rare use of ibuprofen.   On iron daily. Asking to update CBC and iron. Stays tired.   Past Medical History:  Diagnosis Date   Anxiety    Depression    GERD (gastroesophageal reflux disease)    Helicobacter pylori gastritis 10/2021   Treated with generic Prevpac.   Migraines    Pancreatitis     Past Surgical History:  Procedure Laterality Date   BIOPSY  11/06/2019   Procedure: BIOPSY;  Surgeon: Corbin Adeourk, Robert M, MD;  Location: [facility];  Service: Endoscopy;;   BIOPSY  11/12/2021   Procedure: BIOPSY;  Surgeon: Lanelle Balarver, Charles K, DO;  Location: [facility];  Service: Endoscopy;;   CHOLECYSTECTOMY     COLONOSCOPY N/A 11/06/2019   Procedure: COLONOSCOPY;  Surgeon: Corbin Adeourk, Robert M, diverticulosis in the sigmoid colon, minimal grade 2 hemorrhoids, otherwise normal.  Segmental biopsies benign.  Repeat colonoscopy in 10 years.   ESOPHAGOGASTRODUODENOSCOPY N/A 11/06/2019   Procedure: ESOPHAGOGASTRODUODENOSCOPY (EGD);  Surgeon: Corbin Adeourk, Robert M, MD; mild Schatzki's ring s/p dilation, abnormal distal esophageal mucosa with benign biopsies, small hiatal hernia, otherwise normal.   ESOPHAGOGASTRODUODENOSCOPY (EGD) WITH PROPOFOL N/A 11/12/2021   Surgeon: Lanelle Balarver, Charles K, DO; small hiatal hernia, mild Schatzki's ring dilated, esophageal biopsies with reflux changes, H. pylori gastritis.  Treated with Prevpac.   MALONEY DILATION N/A 11/06/2019   Procedure: Elease HashimotoMALONEY DILATION;  Surgeon: Corbin Adeourk, Robert M, MD;  Location: [facility];  Service: Endoscopy;  Laterality: N/A;   TUBAL LIGATION      Current Outpatient Medications  Medication Sig Dispense Refill   AJOVY 225 MG/1.5ML SOAJ INJECT 225 MG INTO THE SKIN  EVERY 30 DAYS 1.5 mL 11   cholestyramine (QUESTRAN) 4 g packet TAKE 1 PACKET MIXED WITH 4 OUNCES OF WATER EVERY DAY WITH BREAKFAST AND TAKE OTHER MEDS 4 TO 6 HOURS LATER 90 each 1   ferrous sulfate 325 (65 FE) MG tablet Take 325 mg by mouth daily with breakfast.     fluticasone (FLONASE) 50 MCG/ACT nasal spray Place 2 sprays into both nostrils daily. 16 g 12   gabapentin (NEURONTIN) 100 MG capsule Take 1 capsule (100 mg total) by mouth 2 (two) times daily. 180 capsule 1   ibuprofen (ADVIL) 200 MG tablet Take 400 mg by mouth every 8 (eight) hours as needed for headache or moderate pain.     levocetirizine (XYZAL) 5 MG tablet Take 1 tablet (5 mg total) by mouth every evening. 90 tablet 3   olopatadine (PATANOL) 0.1 % ophthalmic solution Place 1 drop into both eyes 2 (two) times daily. 5 mL 12   ondansetron (ZOFRAN-ODT) 4 MG disintegrating tablet Take 1 tablet (4 mg total) by mouth every 8 (eight) hours as needed for nausea or vomiting. 15 tablet 0   pantoprazole (PROTONIX) 40 MG tablet TAKE 1 TABLET(40 MG) BY MOUTH TWICE DAILY 60 tablet 5   pseudoephedrine (SUDAFED) 60 MG tablet Take 1 tablet (60 mg total) by mouth every 8 (eight) hours as needed for congestion. 30 tablet 0   No current facility-administered medications for this visit.    Allergies as of 02/24/2022 -  Review Complete 02/24/2022  Allergen Reaction Noted   Codeine Nausea Only 12/25/2014    Family History  Problem Relation Age of Onset   Hypertension Mother    Cancer Mother    Alcohol abuse Father    Alcohol abuse Paternal Grandfather    Depression Maternal Aunt    Depression Paternal Aunt    Colon cancer Neg Hx    Pancreatic cancer Neg Hx     Social History   Socioeconomic History   Marital status: Legally Separated    Spouse name: Not on file   Number of children: Not on file   Years of education: Not on file   Highest education level: Not on file  Occupational History   Not on file  Tobacco Use   Smoking  status: Never   Smokeless tobacco: Never  Vaping Use   Vaping Use: Never used  Substance and Sexual Activity   Alcohol use: No   Drug use: No   Sexual activity: Not Currently  Other Topics Concern   Not on file  Social History Narrative   Not on file   Social Determinants of Health   Financial Resource Strain: Not on file  Food Insecurity: Not on file  Transportation Needs: Not on file  Physical Activity: Not on file  Stress: Not on file  Social Connections: Not on file    Review of Systems: Gen: Denies fever, chills, cold or flu like symptoms, pre-syncope, or syncope.  CV: Denies chest pain, palpitations. Resp: Denies dyspnea, cough.  GI: See HPI Heme: See HPI  Physical Exam: BP 118/68   Pulse 78   Temp 97.6 F (36.4 C) (Temporal)   Ht  (1.575 m)   Wt 188 lb 12.8 oz (85.6 kg)   LMP 01/31/2022 (Approximate)   BMI 34.53 kg/m  General:   Alert and oriented. No distress noted. Pleasant and cooperative.  Head:  Normocephalic and atraumatic. Eyes:  Conjuctiva clear without scleral icterus. Heart:  S1, S2 present without murmurs appreciated. Lungs:  Clear to auscultation bilaterally. No wheezes, rales, or rhonchi. No distress.  Abdomen:  +BS, soft, non-tender and non-distended. No rebound or guarding. No HSM or masses noted. Msk:  Symmetrical without gross deformities. Normal posture. Extremities:  Without edema. Neurologic:  Alert and  oriented x4 Psych:  Normal mood and affect.    Assessment:  46 year old female with history of GERD, dysphagia, early satiety, nausea with vomiting, postprandial diarrhea, mild anemia suspected to be influenced by menorrhagia, presenting today for follow-up of GERD, diarrhea, LUQ abdominal pain and nausea.  LUQ abdominal pain/nausea/Early satiety: Patient reported new onset LUQ abdominal pain in December, initially felt to be secondary to MSK etiology as symptoms started when she had acute respiratory illness with cough.   However, symptoms persisted despite a course of anti-inflammatories and she later developed associated nausea.  Laboratory evaluation and CT A/P with contrast in January unrevealing.  Underwent EGD February 2023 revealed chronic active gastritis with H. pylori.  She was treated with generic equivalent of Prevpac x14 days. Reports LUQ abdominal pain resolved, but she had persistent problems with early satiety with associated nausea after a few bites of food.  States she had improvement for a couple of weeks after antibiotics, but symptoms returned. I also note 11 lb weigh loss over the last 6 month, some of which has been intentional with increased activity, but likely influenced by unintentional decreased po intake.   Query whether persistent early satiety and nausea are secondary to  persistent H. pylori infection.  I do note that her symptoms initially started back in December following a viral respiratory illness and she also recently had a viral gastroenteritis 1 month ago.  In the setting, unable to rule out postinfectious gastroparesis as well.  We will have her complete H. pylori breath test after holding PPI x14 days.  If positive, we will retreat.  If negative, consider updating gastric emptying study.  GERD: Chronic.  Well-controlled on Protonix 40 mg twice daily.  Chronic postprandial diarrhea: Felt to be multifactorial in the setting of bile salt diarrhea, dietary intolerances, possible IBS-D.  Extensive work-up previously with stool studies, labs, colonoscopy all unrevealing (see HPI for details) and was started on Questran with symptom improvement.  She continues to do very well with Questran 4 g daily and dairy avoidance.  Occasional breakthrough symptoms, but nothing routine.  History of IDA:  Chronic normocytic anemia on daily iron, felt to be secondary to menorrhagia previously. EGD and TCS February 2021 with no significant findings to explain mild anemia, due for screening colonoscopy in  2031.  Most recent EGD February 2023 again with Schatzki's ring s/p dilation, small hiatal hernia, chronic active H. pylori gastritis, normal examined duodenum. Last hemoglobin in January was 10.8 with normocytic indices, which is within her baseline fluctuations. She has continued on oral iron daily but is asking about updating labs as she feel tired all the time. It is possible that H pylori gastritis may be worsening her chronic anemia. Will update labs and have further recommendations to follow.    Plan:  Hold pantoprazole x14 days, then complete H. pylori breath test.  She will start holding pantoprazole on June 12 and complete breath test on June 27.  Thereafter, she will resume Protonix 40 mg twice daily. - While holding pantoprazole, may take over-the-counter Pepcid up to twice daily, but none within 48 hours of completing breath test.  Advised not to take Tums, Pepto-Bismol, OTC antiacids, antibiotics during this 14-day hold. Update CBC and iron panel. 4-6 small meals daily. Low-fat/low fiber diet. Continue Questran 4 g daily. Continue avoidance of dairy products or take Lactaid tablets prior to dairy consumption. Further recommendations to follow H. pylori breath test and blood work. Follow-up date TBD.   Ermalinda Memos, PA-C [facility] 02/24/2022

## 2022-02-23 ENCOUNTER — Ambulatory Visit (INDEPENDENT_AMBULATORY_CARE_PROVIDER_SITE_OTHER): Payer: BC Managed Care – PPO | Admitting: Orthopedic Surgery

## 2022-02-23 ENCOUNTER — Telehealth: Payer: Self-pay | Admitting: *Deleted

## 2022-02-23 DIAGNOSIS — M7711 Lateral epicondylitis, right elbow: Secondary | ICD-10-CM

## 2022-02-23 NOTE — Telephone Encounter (Signed)
Noted, thank you

## 2022-02-23 NOTE — Telephone Encounter (Signed)
Noted. Let me know if there is anything I need to assist with.

## 2022-02-23 NOTE — Telephone Encounter (Signed)
Received letter of expiring prior authorization for pt. Prior authorization  was started.

## 2022-02-23 NOTE — Progress Notes (Signed)
Chief Complaint  Patient presents with   Elbow Pain    Rt elbow pain recurrent, follow up Pt states pain was better for a few weeks and pain increased when she went back to work.   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 comes in with recurrent pain in her right elbow tender lateral    She had an injection, wears a brace, used ice, anti-inflammatories,  However when she went back to work the pain began again  Reexamination of the right elbow show she has tenderness over the right lateral epicondyle decreased extension normal flexion pain with wrist extension  Recommend repeat injection  Focus on exercise ice and anti-inflammatories  Follow-up as needed  Procedure note injection for right tennis elbow   Diagnosis right tennis elbow  Anesthesia ethyl chloride was used Alcohol use is clean the skin  After we obtained verbal consent and timeout a 25-gauge needle was used to inject 40 mg of Depo-Medrol and 3 cc of 1% lidocaine just distal to the insertion of the ECRB  There were no complications and a sterile bandage was applied.

## 2022-02-23 NOTE — Patient Instructions (Signed)

## 2022-02-24 ENCOUNTER — Ambulatory Visit (INDEPENDENT_AMBULATORY_CARE_PROVIDER_SITE_OTHER): Payer: BC Managed Care – PPO | Admitting: Gastroenterology

## 2022-02-24 ENCOUNTER — Encounter: Payer: Self-pay | Admitting: Gastroenterology

## 2022-02-24 VITALS — BP 118/68 | HR 78 | Temp 97.6°F | Ht 62.0 in | Wt 188.8 lb

## 2022-02-24 DIAGNOSIS — R6881 Early satiety: Secondary | ICD-10-CM

## 2022-02-24 DIAGNOSIS — K2970 Gastritis, unspecified, without bleeding: Secondary | ICD-10-CM

## 2022-02-24 DIAGNOSIS — K219 Gastro-esophageal reflux disease without esophagitis: Secondary | ICD-10-CM

## 2022-02-24 DIAGNOSIS — D649 Anemia, unspecified: Secondary | ICD-10-CM | POA: Diagnosis not present

## 2022-02-24 DIAGNOSIS — B9681 Helicobacter pylori [H. pylori] as the cause of diseases classified elsewhere: Secondary | ICD-10-CM

## 2022-02-24 DIAGNOSIS — R197 Diarrhea, unspecified: Secondary | ICD-10-CM | POA: Diagnosis not present

## 2022-02-24 NOTE — Patient Instructions (Addendum)
Start holding pantoprazole on June 12, then complete H. pylori breath test on June 27.  If you need something to help with your acid reflux while holding pantoprazole, you can take Pepcid 20 mg up to twice daily, but no Pepcid within 48 hours of H. pylori breath test.  Do not take Tums, Pepto-Bismol, over-the-counter antacids, antibiotics during this 14-day period.   After you complete the H. pylori breath test, you can resume pantoprazole 40 mg twice daily.  Please also have blood work to check your hemoglobin and iron levels completed at the same time as your H. pylori breath test.  Try eating 4-6 small meals daily.  Follow a low-fat/low fiber diet for now.  Continue Questran 4 g daily.  Continue to avoid dairy products or take Lactaid tablets prior to dairy consumption.  We will have further recommendations for you following your H. pylori breath test and blood work. Follow-up date to be determined.  It was good to see you again today! I am sorry you are not feeling well.   Ermalinda Memos, PA-C [facility]

## 2022-03-11 DIAGNOSIS — G4733 Obstructive sleep apnea (adult) (pediatric): Secondary | ICD-10-CM | POA: Diagnosis not present

## 2022-03-15 DIAGNOSIS — D649 Anemia, unspecified: Secondary | ICD-10-CM | POA: Diagnosis not present

## 2022-03-15 DIAGNOSIS — B9681 Helicobacter pylori [H. pylori] as the cause of diseases classified elsewhere: Secondary | ICD-10-CM | POA: Diagnosis not present

## 2022-03-15 DIAGNOSIS — K2970 Gastritis, unspecified, without bleeding: Secondary | ICD-10-CM | POA: Diagnosis not present

## 2022-03-17 ENCOUNTER — Other Ambulatory Visit: Payer: Self-pay | Admitting: Gastroenterology

## 2022-03-17 DIAGNOSIS — K219 Gastro-esophageal reflux disease without esophagitis: Secondary | ICD-10-CM

## 2022-03-18 ENCOUNTER — Other Ambulatory Visit: Payer: Self-pay | Admitting: Gastroenterology

## 2022-03-18 DIAGNOSIS — A048 Other specified bacterial intestinal infections: Secondary | ICD-10-CM | POA: Insufficient documentation

## 2022-03-18 LAB — CBC WITH DIFFERENTIAL/PLATELET
Absolute Monocytes: 363 cells/uL (ref 200–950)
Basophils Absolute: 33 cells/uL (ref 0–200)
Basophils Relative: 0.5 %
Eosinophils Absolute: 158 cells/uL (ref 15–500)
Eosinophils Relative: 2.4 %
HCT: 32.4 % — ABNORMAL LOW (ref 35.0–45.0)
Hemoglobin: 10.6 g/dL — ABNORMAL LOW (ref 11.7–15.5)
Lymphs Abs: 2792 cells/uL (ref 850–3900)
MCH: 29.8 pg (ref 27.0–33.0)
MCHC: 32.7 g/dL (ref 32.0–36.0)
MCV: 91 fL (ref 80.0–100.0)
MPV: 9.1 fL (ref 7.5–12.5)
Monocytes Relative: 5.5 %
Neutro Abs: 3254 cells/uL (ref 1500–7800)
Neutrophils Relative %: 49.3 %
Platelets: 334 10*3/uL (ref 140–400)
RBC: 3.56 10*6/uL — ABNORMAL LOW (ref 3.80–5.10)
RDW: 12.7 % (ref 11.0–15.0)
Total Lymphocyte: 42.3 %
WBC: 6.6 10*3/uL (ref 3.8–10.8)

## 2022-03-18 LAB — IRON,TIBC AND FERRITIN PANEL
%SAT: 23 % (calc) (ref 16–45)
Ferritin: 22 ng/mL (ref 16–232)
Iron: 85 ug/dL (ref 40–190)
TIBC: 367 mcg/dL (calc) (ref 250–450)

## 2022-03-18 LAB — H. PYLORI BREATH TEST: H. pylori Breath Test: DETECTED — AB

## 2022-03-18 MED ORDER — BISMUTH SUBSALICYLATE 262 MG PO CHEW: 524.0000 mg | CHEWABLE_TABLET | Freq: Four times a day (QID) | ORAL | 0 refills | Status: DC

## 2022-03-18 MED ORDER — METRONIDAZOLE 500 MG PO TABS: 500.0000 mg | ORAL_TABLET | Freq: Three times a day (TID) | ORAL | 0 refills | Status: DC

## 2022-03-18 MED ORDER — TETRACYCLINE HCL 500 MG PO CAPS: 500.0000 mg | ORAL_CAPSULE | Freq: Four times a day (QID) | ORAL | 0 refills | Status: DC

## 2022-03-21 ENCOUNTER — Telehealth: Payer: Self-pay | Admitting: Internal Medicine

## 2022-03-21 ENCOUNTER — Other Ambulatory Visit: Payer: Self-pay | Admitting: Gastroenterology

## 2022-03-21 DIAGNOSIS — A048 Other specified bacterial intestinal infections: Secondary | ICD-10-CM

## 2022-03-21 DIAGNOSIS — R112 Nausea with vomiting, unspecified: Secondary | ICD-10-CM

## 2022-03-21 MED ORDER — ONDANSETRON HCL 4 MG PO TABS: 4.0000 mg | ORAL_TABLET | Freq: Three times a day (TID) | ORAL | 1 refills | Status: DC | PRN

## 2022-03-21 MED ORDER — LEVOFLOXACIN 500 MG PO TABS: 500.0000 mg | ORAL_TABLET | Freq: Every day | ORAL | 0 refills | Status: AC

## 2022-03-21 MED ORDER — AMOXICILLIN 500 MG PO TABS: 750.0000 mg | ORAL_TABLET | Freq: Three times a day (TID) | ORAL | 0 refills | Status: AC

## 2022-03-21 NOTE — Telephone Encounter (Signed)
Spoke with patient.  She reports since starting antibiotics on Friday, she has been having nausea and vomiting as well as frequent diarrhea.  Recommended trial of different regimen of antibiotics to treat H. pylori.  We will try her on levofloxacin 500 mg daily and amoxicillin 750 mg 3 times daily for 14 days.  She will continue Protonix 40 mg twice daily.  Advised that she stop her current antibiotic regimen and allow her GI symptoms to improve before starting her next antibiotic course.  I will also send in Zofran to use as needed for nausea.  Advised that she may use Imodium to help with diarrhea. She will call if she has any persistent issues.

## 2022-03-21 NOTE — Telephone Encounter (Signed)
Pt called stating the h pylori medication is causing her to have nausea and diarrhea. Please advise.

## 2022-03-21 NOTE — Telephone Encounter (Signed)
Please call patient, she has a medication question

## 2022-03-25 ENCOUNTER — Other Ambulatory Visit: Payer: Self-pay | Admitting: Adult Health

## 2022-03-31 ENCOUNTER — Ambulatory Visit (INDEPENDENT_AMBULATORY_CARE_PROVIDER_SITE_OTHER): Payer: BC Managed Care – PPO

## 2022-03-31 ENCOUNTER — Encounter: Payer: Self-pay | Admitting: Orthopedic Surgery

## 2022-03-31 ENCOUNTER — Ambulatory Visit (INDEPENDENT_AMBULATORY_CARE_PROVIDER_SITE_OTHER): Payer: BC Managed Care – PPO | Admitting: Orthopedic Surgery

## 2022-03-31 VITALS — BP 117/72 | HR 76 | Ht 62.0 in | Wt 186.0 lb

## 2022-03-31 DIAGNOSIS — M76829 Posterior tibial tendinitis, unspecified leg: Secondary | ICD-10-CM | POA: Diagnosis not present

## 2022-03-31 DIAGNOSIS — M7661 Achilles tendinitis, right leg: Secondary | ICD-10-CM | POA: Diagnosis not present

## 2022-03-31 DIAGNOSIS — M25571 Pain in right ankle and joints of right foot: Secondary | ICD-10-CM | POA: Diagnosis not present

## 2022-03-31 NOTE — Progress Notes (Addendum)
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  Comes in with ankle pain difficulty climbing stairs  Past Medical History:  Diagnosis Date   Anxiety    Depression    GERD (gastroesophageal reflux disease)    Helicobacter pylori gastritis 10/2021   Treated with generic Prevpac.   Migraines    Pancreatitis     General appearance: Well-developed well-nourished no gross deformities  Cardiovascular normal pulse and perfusion normal color without edema  Neurologically no sensation loss or deficits or pathologic reflexes  Psychological: Awake alert and oriented x3 mood and affect normal  Skin no lacerations or ulcerations no nodularity no palpable masses, no erythema or nodularity  Musculoskeletal: Slightly decreased arch but not flatfoot normal ankle motion and subtalar motion double leg tiptoe test was normal single leg was abnormal tenderness the Achilles tendon and posterior tibial tendon  Imaging ankle images normal except for plantar spur no Achilles calcifications  A/P  Achilles tendinitis posterior tibial tendinitis  Boot for 6 weeks  Over-the-counter NSAIDs for 6 weeks  Follow-up 6 weeks

## 2022-04-01 NOTE — Progress Notes (Signed)
c 

## 2022-04-02 ENCOUNTER — Ambulatory Visit
Admission: RE | Admit: 2022-04-02 | Discharge: 2022-04-02 | Disposition: A | Discharge status: Home / Self Care | Payer: BC Managed Care – PPO | Source: Ambulatory Visit | Attending: Physician Assistant | Admitting: Physician Assistant

## 2022-04-02 VITALS — BP 111/77 | HR 78 | Temp 98.0°F | Resp 16

## 2022-04-02 DIAGNOSIS — M26622 Arthralgia of left temporomandibular joint: Secondary | ICD-10-CM

## 2022-04-02 DIAGNOSIS — H9202 Otalgia, left ear: Secondary | ICD-10-CM

## 2022-04-02 MED ORDER — CELECOXIB 100 MG PO CAPS: 100.0000 mg | ORAL_CAPSULE | Freq: Two times a day (BID) | ORAL | 0 refills | Status: DC

## 2022-04-02 NOTE — Discharge Instructions (Signed)
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  Use warm compresses.  Avoid repetitive chewing including chewing gum or difficult to chew foods.  Start Celebrex twice daily.  Take this with food.  Use a warm compress on the area.  Continue your allergy medication including Flonase as prescribed.  If your symptoms are not improving or if anything worsens you should be reevaluated.

## 2022-04-02 NOTE — ED Triage Notes (Signed)
Pt reports left ear pain and "pop" sound x 5 days. Pt has not used any meds for complaint.

## 2022-04-02 NOTE — ED Provider Notes (Signed)
[facility]    CSN: [id number] Arrival date & time: 04/02/22  0845      History   Chief Complaint Chief Complaint  Patient presents with   Ear Fullness    Entered by patient   Otalgia    HPI Tonya Love is a 46 y.o. female.   Patient presents today with a 5-day history of left ear pain and popping.  Reports that there is minimal pain at rest but when she opens her jaw she will have a sharp pain in her ear.  Denies any recent swimming or airplane travel.  Denies any concern for barotrauma.  She has not tried any over-the-counter medication for symptom management.  She does have a history of seasonal allergies and has been taking medication as prescribed.  Denies any recent illness including cough, congestion, fever, nausea, vomiting.  Denies any otorrhea or change in her hearing.  She does report that recently she started using CPAP and is unsure if this is contributing to symptoms.    Past Medical History:  Diagnosis Date   Anxiety    Depression    GERD (gastroesophageal reflux disease)    Helicobacter pylori gastritis 10/2021   Treated with generic Prevpac.   Migraines    Pancreatitis     Patient Active Problem List   Diagnosis Date Noted   H. pylori infection 03/18/2022   Helicobacter pylori gastritis 02/24/2022   Right tennis elbow 02/23/2022   Mild sleep apnea 12/13/2021   Obesity 12/13/2021   Left flank pain 09/03/2021   Early satiety 12/18/2019   Dysphagia 08/27/2019   Anemia 08/26/2019   Diarrhea 08/26/2019   Gastroesophageal reflux disease 08/26/2019   Nausea with vomiting 08/26/2019   History of diverticulitis 08/26/2019   Rectal bleeding 08/26/2019   Anxiety state, unspecified 02/11/2014    Past Surgical History:  Procedure Laterality Date   BIOPSY  11/06/2019   Procedure: BIOPSY;  Surgeon: Corbin Ade, MD;  [facility];  Service: Endoscopy;;   BIOPSY  11/12/2021   Procedure: BIOPSY;  Surgeon: Lanelle Bal,  DO;  [facility];  Service: Endoscopy;;   CHOLECYSTECTOMY     COLONOSCOPY N/A 11/06/2019   Procedure: COLONOSCOPY;  Surgeon: Corbin Ade, diverticulosis in the sigmoid colon, minimal grade 2 hemorrhoids, otherwise normal.  Segmental biopsies benign.  Repeat colonoscopy in 10 years.   ESOPHAGOGASTRODUODENOSCOPY N/A 11/06/2019   Procedure: ESOPHAGOGASTRODUODENOSCOPY (EGD);  Surgeon: Corbin Ade, MD; mild Schatzki's ring s/p dilation, abnormal distal esophageal mucosa with benign biopsies, small hiatal hernia, otherwise normal.   ESOPHAGOGASTRODUODENOSCOPY (EGD) WITH PROPOFOL N/A 11/12/2021   Surgeon: Lanelle Bal, DO; small hiatal hernia, mild Schatzki's ring dilated, esophageal biopsies with reflux changes, H. pylori gastritis.  Treated with Prevpac.   MALONEY DILATION N/A 11/06/2019   Procedure: Elease Hashimoto DILATION;  Surgeon: Corbin Ade, MD;  [facility];  Service: Endoscopy;  Laterality: N/A;   TUBAL LIGATION      OB History     Gravida      Para      Term      Preterm      AB      Living  2      SAB      IAB      Ectopic      Multiple      Live Births               Home Medications  Prior to Admission medications   Medication Sig Start Date End Date Taking? Authorizing Provider  celecoxib (CELEBREX) 100 MG capsule Take 1 capsule (100 mg total) by mouth 2 (two) times daily. 04/02/22  Yes Tmya Wigington K, PA-C  AJOVY 225 MG/1.5ML SOAJ INJECT 225 MG INTO THE SKIN EVERY 30 DAYS 05/17/21   [name], NP  amoxicillin (AMOXIL) 500 MG tablet Take 1.5 tablets (750 mg total) by mouth in the morning, at noon, and at bedtime for 14 days. 03/21/22 04/04/22  [name], PA-C  cholestyramine (QUESTRAN) 4 g packet TAKE 1 PACKET MIXED WITH 4 OUNCES OF WATER EVERY DAY WITH BREAKFAST AND TAKE OTHER MEDS 4 TO 6 HOURS LATER Patient not taking: Reported on 03/31/2022 12/29/21   [name], PA-C  ferrous sulfate 325 (65 FE) MG  tablet Take 325 mg by mouth daily with breakfast.    [provider]  fluticasone (FLONASE) 50 MCG/ACT nasal spray Place 2 sprays into both nostrils daily. 01/01/22   Wallis Bamberg, PA-C  gabapentin (NEURONTIN) 100 MG capsule TAKE 1 CAPSULE(100 MG) BY MOUTH TWICE DAILY 03/25/22   [name], NP  ibuprofen (ADVIL) 200 MG tablet Take 400 mg by mouth every 8 (eight) hours as needed for headache or moderate pain. Patient not taking: Reported on 03/31/2022    [provider]  levocetirizine (XYZAL) 5 MG tablet Take 1 tablet (5 mg total) by mouth every evening. 01/01/22   Wallis Bamberg, PA-C  levofloxacin (LEVAQUIN) 500 MG tablet Take 1 tablet (500 mg total) by mouth daily for 14 days. 03/21/22 04/04/22  [name], PA-C  olopatadine (PATANOL) 0.1 % ophthalmic solution Place 1 drop into both eyes 2 (two) times daily. 01/01/22   Wallis Bamberg, PA-C  ondansetron (ZOFRAN) 4 MG tablet Take 1 tablet (4 mg total) by mouth every 8 (eight) hours as needed for nausea or vomiting. 03/21/22 03/21/23  [name], PA-C  pseudoephedrine (SUDAFED) 60 MG tablet Take 1 tablet (60 mg total) by mouth every 8 (eight) hours as needed for congestion. 01/01/22   Wallis Bamberg, PA-C  mirtazapine (REMERON) 30 MG tablet Take 1 tablet (30 mg total) by mouth at bedtime. 10/25/20 12/01/20  [name], MD    Family History Family History  Problem Relation Age of Onset   Hypertension Mother    Cancer Mother    Alcohol abuse Father    Alcohol abuse Paternal Grandfather    Depression Maternal Aunt    Depression Paternal Aunt    Colon cancer Neg Hx    Pancreatic cancer Neg Hx     Social History Social History   Tobacco Use   Smoking status: Never   Smokeless tobacco: Never  Vaping Use   Vaping Use: Never used  Substance Use Topics   Alcohol use: No   Drug use: No     Allergies   Codeine   Review of Systems Review of Systems  Constitutional:  Positive for activity change. Negative for appetite  change, fatigue and fever.  HENT:  Positive for ear pain. Negative for congestion, ear discharge, sinus pressure, sneezing and sore throat.   Respiratory:  Negative for cough and shortness of breath.   Cardiovascular:  Negative for chest pain.  Gastrointestinal:  Negative for abdominal pain, diarrhea, nausea and vomiting.     Physical Exam Triage Vital Signs ED Triage Vitals  Enc Vitals Group     BP 04/02/22 0852 111/77     Pulse Rate 04/02/22 0852 78  Resp 04/02/22 0852 16     Temp 04/02/22 0852 98 F (36.7 C)     Temp Source 04/02/22 0852 Oral     SpO2 04/02/22 0852 99 %     Weight --      Height --      Head Circumference --      Peak Flow --      Pain Score 04/02/22 0851 8     Pain Loc --      Pain Edu? --      Excl. in GC? --    No data found.  Updated Vital Signs BP 111/77 (BP Location: Right Arm)   Pulse 78   Temp 98 F (36.7 C) (Oral)   Resp 16   LMP  (Within Months) Comment: 1 month  SpO2 99%   Visual Acuity Right Eye Distance:   Left Eye Distance:   Bilateral Distance:    Right Eye Near:   Left Eye Near:    Bilateral Near:     Physical Exam Vitals reviewed.  Constitutional:      General: She is awake. She is not in acute distress.    Appearance: Normal appearance. She is well-developed. She is not ill-appearing.     Comments: Very pleasant female appears stated age in no acute distress sitting comfortably in exam room  HENT:     Head: Normocephalic and atraumatic.     Jaw: Pain on movement present. No tenderness or malocclusion.     Right Ear: Ear canal and external ear normal. A middle ear effusion is present. Tympanic membrane is not erythematous or bulging.     Left Ear: Ear canal and external ear normal. A middle ear effusion is present. Tympanic membrane is not erythematous or bulging.     Ears:     Comments: Air-fluid level bilaterally    Nose:     Right Sinus: No maxillary sinus tenderness or frontal sinus tenderness.     Left Sinus:  No maxillary sinus tenderness or frontal sinus tenderness.     Mouth/Throat:     Pharynx: Uvula midline. No oropharyngeal exudate or posterior oropharyngeal erythema.  Cardiovascular:     Rate and Rhythm: Normal rate and regular rhythm.     Heart sounds: Normal heart sounds, S1 normal and S2 normal. No murmur heard. Pulmonary:     Effort: Pulmonary effort is normal.     Breath sounds: Normal breath sounds. No wheezing, rhonchi or rales.     Comments: Clear to auscultation bilaterally Psychiatric:        Behavior: Behavior is cooperative.      UC Treatments / Results  Labs (all labs ordered are listed, but only abnormal results are displayed) Labs Reviewed - No data to display  EKG   Radiology No results found.  Procedures Procedures (including critical care time)  Medications Ordered in UC Medications - No data to display  Initial Impression / Assessment and Plan / UC Course  I have reviewed the triage vital signs and the nursing notes.  Pertinent labs & imaging results that were available during my care of the patient were reviewed by me and considered in my medical decision making (see chart for details).     No evidence of infection on physical exam that would warrant initiation of antibiotics.  Discussed that eustachian tube dysfunction could be contributing to symptoms and encouraged her to continue her allergy medication as previously prescribed.  Upon palpation she did have some pain with tenderness  of the jaw concerning for TMJ arthralgia.  We will start Celebrex for pain relief given history of GERD.  Discussed that she should take this medication with food.  I do not believe that her CPAP is contributing to symptoms but if they persist it would be worthwhile to follow-up with sleep medicine to discuss alternative masks.  If she develops any additional symptoms including fever, otorrhea, change in hearing, congestion, cough she should be seen again.  Strict return  precautions given.  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  Use warm compresses.  Avoid repetitive chewing including chewing gum or difficult to chew foods.  Start Celebrex twice daily.  Take this with food.  Use a warm compress on the area.  Continue your allergy medication including Flonase as prescribed.  If your symptoms are not improving or if anything worsens you should be reevaluated.     ED Prescriptions     Medication Sig Dispense Auth. Provider   celecoxib (CELEBREX) 100 MG capsule Take 1 capsule (100 mg total) by mouth 2 (two) times daily. 20 capsule Nadalee Neiswender, Noberto Retort, PA-C      PDMP not reviewed this encounter.   Jeani Hawking, PA-C 04/02/22 1607

## 2022-04-10 DIAGNOSIS — G4733 Obstructive sleep apnea (adult) (pediatric): Secondary | ICD-10-CM | POA: Diagnosis not present

## 2022-04-13 ENCOUNTER — Other Ambulatory Visit: Payer: Self-pay | Admitting: Adult Health

## 2022-04-17 NOTE — Progress Notes (Unsigned)
Cardiology Office Note:    Date:  04/19/2022   ID:  Tonya Love, DOB 11-19-1975, MRN [id number]  PCP:  Pcp, No  Cardiologist:  Little Ishikawa, MD  Electrophysiologist:  None   Referring MD: No ref. provider found   Chief Complaint  Patient presents with   Chest Pain    History of Present Illness:    Tonya Love is a 46 y.o. female with a hx of anxiety, depression, pancreatitis who presents for follow-up.  She is seen initially for evaluation of palpitations and chest pain on 06/30/2021.Marland Kitchen  She reports she has been having palpitations which she describes as feeling her heart will race and then go slow.  Lasts for couple minutes, can occur throughout the day.  Also reports feeling short of breath with exertion.  In addition reports occasional chest pains.  Describes as sharp pain left-sided pain.  Can occur at rest.  However does report can be brought on with exertion.  She walks 1-2 times per week for 3 miles and will sometimes have chest pain when walking.  She has been told she seems to stop breathing when she sleeps.  States that she feels tired throughout the day.  Has been told she snores.  No smoking history.  Family history includes paternal uncle died of MI at 84.  Zio patch x9 days on 07/21/2021 showed no significant abnormalities.  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  Echocardiogram 11/04/2021 showed normal biventricular function, no significant valvular disease.  Since last clinic visit, she reports that she has been doing okay.  She recently started CPAP.  Continues to have intermittent palpitations and occasional chest pain, but has improved.  She feels shortness of breath when going up stairs.  She has been walking a couple times per week, up to 45 minutes.   Past Medical History:  Diagnosis Date   Anxiety    Depression    GERD (gastroesophageal reflux disease)    Helicobacter pylori  gastritis 10/2021   Treated with generic Prevpac.   Migraines    Pancreatitis    Sleep apnea     Past Surgical History:  Procedure Laterality Date   BIOPSY  11/06/2019   Procedure: BIOPSY;  Surgeon: Corbin Ade, MD;  [facility];  Service: Endoscopy;;   BIOPSY  11/12/2021   Procedure: BIOPSY;  Surgeon: Lanelle Bal, DO;  [facility];  Service: Endoscopy;;   CHOLECYSTECTOMY     COLONOSCOPY N/A 11/06/2019   Procedure: COLONOSCOPY;  Surgeon: Corbin Ade, diverticulosis in the sigmoid colon, minimal grade 2 hemorrhoids, otherwise normal.  Segmental biopsies benign.  Repeat colonoscopy in 10 years.   ESOPHAGOGASTRODUODENOSCOPY N/A 11/06/2019   Procedure: ESOPHAGOGASTRODUODENOSCOPY (EGD);  Surgeon: Corbin Ade, MD; mild Schatzki's ring s/p dilation, abnormal distal esophageal mucosa with benign biopsies, small hiatal hernia, otherwise normal.   ESOPHAGOGASTRODUODENOSCOPY (EGD) WITH PROPOFOL N/A 11/12/2021   Surgeon: Lanelle Bal, DO; small hiatal hernia, mild Schatzki's ring dilated, esophageal biopsies with reflux changes, H. pylori gastritis.  Treated with Prevpac.   MALONEY DILATION N/A 11/06/2019   Procedure: Elease Hashimoto DILATION;  Surgeon: Corbin Ade, MD;  [facility];  Service: Endoscopy;  Laterality: N/A;   TUBAL LIGATION      Current Medications: Current Meds  Medication Sig   celecoxib (CELEBREX) 100 MG capsule Take 1 capsule (100 mg total) by mouth 2 (two) times daily.  ferrous sulfate 325 (65 FE) MG tablet Take 325 mg by mouth daily with breakfast.   fluticasone (FLONASE) 50 MCG/ACT nasal spray Place 2 sprays into both nostrils daily.   Fremanezumab-vfrm (AJOVY) 225 MG/1.5ML SOAJ INJECT 225MG  INTO THE SKIN EVERY 30 DAYS   gabapentin (NEURONTIN) 100 MG capsule TAKE 1 CAPSULE(100 MG) BY MOUTH TWICE DAILY   levocetirizine (XYZAL) 5 MG tablet Take 1 tablet (5 mg total) by mouth every evening.   olopatadine (PATANOL) 0.1 %  ophthalmic solution Place 1 drop into both eyes 2 (two) times daily.   ondansetron (ZOFRAN) 4 MG tablet Take 1 tablet (4 mg total) by mouth every 8 (eight) hours as needed for nausea or vomiting.   pseudoephedrine (SUDAFED) 60 MG tablet Take 1 tablet (60 mg total) by mouth every 8 (eight) hours as needed for congestion.     Allergies:   Codeine   Social History   Socioeconomic History   Marital status: Legally Separated    Spouse name: Not on file   Number of children: Not on file   Years of education: Not on file   Highest education level: Not on file  Occupational History   Not on file  Tobacco Use   Smoking status: Never   Smokeless tobacco: Never  Vaping Use   Vaping Use: Never used  Substance and Sexual Activity   Alcohol use: No   Drug use: No   Sexual activity: Not Currently  Other Topics Concern   Not on file  Social History Narrative   Not on file   Social Determinants of Health   Financial Resource Strain: Not on file  Food Insecurity: Not on file  Transportation Needs: Not on file  Physical Activity: Not on file  Stress: Not on file  Social Connections: Not on file     Family History: The patient's family history includes Alcohol abuse in her father and paternal grandfather; Cancer in her mother; Depression in her maternal aunt and paternal aunt; Hypertension in her mother. There is no history of Colon cancer or Pancreatic cancer.  ROS:   Please see the history of present illness.     All other systems reviewed and are negative.  EKGs/Labs/Other Studies Reviewed:    The following studies were reviewed today:   EKG:  EKG is not ordered today.  The ekg ordered at prior clinic visit demonstrates normal sinus rhythm, rate 81, no ST abnormalities  Recent Labs: 06/30/2021: TSH 0.815 09/23/2021: ALT 10; BUN 10; Creat 0.85; Potassium 3.7; Sodium 141 03/15/2022: Hemoglobin 10.6; Platelets 334  Recent Lipid Panel    Component Value Date/Time   CHOL 178  06/30/2021 1457   TRIG 190 (H) 06/30/2021 1457   HDL 43 06/30/2021 1457   CHOLHDL 4.1 06/30/2021 1457   VLDL 38 06/30/2021 1457   LDLCALC 97 06/30/2021 1457    Physical Exam:    VS:  BP (!) 106/58   Pulse 86   Ht 5\' 2"  (1.575 m)   Wt 188 lb (85.3 kg)   SpO2 98%   BMI 34.39 kg/m     Wt Readings from Last 3 Encounters:  04/19/22 188 lb (85.3 kg)  03/31/22 186 lb (84.4 kg)  02/24/22 188 lb 12.8 oz (85.6 kg)     GEN:  Well nourished, well developed in no acute distress HEENT: Normal NECK: No JVD; No carotid bruits LYMPHATICS: No lymphadenopathy CARDIAC: RRR, no murmurs, rubs, gallops RESPIRATORY:  Clear to auscultation without rales, wheezing or rhonchi  ABDOMEN: Soft,  non-tender, non-distended MUSCULOSKELETAL:  No edema; No deformity  SKIN: Warm and dry NEUROLOGIC:  Alert and oriented x 3 PSYCHIATRIC:  Normal affect   ASSESSMENT:    1. Palpitations   2. Chest pain of uncertain etiology   3. Dilation of pulmonary artery (HCC)   4. SOB (shortness of breath)   5. OSA (obstructive sleep apnea)      PLAN:    Palpitations: Zio patch x9 days on 07/21/2021 showed no significant abnormalities.   Chest pain: Atypical in description but does report can be brought on by exertion.  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  Echocardiogram 11/04/2021 showed normal biventricular function, no significant valvular disease.  DOE: Echocardiogram 11/04/2021 showed normal biventricular function, no significant valvular disease.  OSA: positive sleep study 11/2021, follows with Dr. Craige Cotta.  Started CPAP.    RTC in 1 year  Medication Adjustments/Labs and Tests Ordered: Current medicines are reviewed at length with the patient today.  Concerns regarding medicines are outlined above.  No orders of the defined types were placed in this encounter.  No orders of the defined types were placed in this  encounter.   Patient Instructions  Medication Instructions:  Your physician recommends that you continue on your current medications as directed. Please refer to the Current Medication list given to you today.  *If you need a refill on your cardiac medications before your next appointment, please call your pharmacy*   Lab Work: NONE   If you have labs (blood work) drawn today and your tests are completely normal, you will receive your results only by: MyChart Message (if you have MyChart) OR A paper copy in the mail If you have any lab test that is abnormal or we need to change your treatment, we will call you to review the results.   Testing/Procedures: NONE    Follow-Up: At [facility], you and your health needs are our priority.  As part of our continuing mission to provide you with exceptional heart care, we have created designated Provider Care Teams.  These Care Teams include your primary Cardiologist (physician) and Advanced Practice Providers (APPs -  Physician Assistants and Nurse Practitioners) who all work together to provide you with the care you need, when you need it.  We recommend signing up for the patient portal called "MyChart".  Sign up information is provided on this After Visit Summary.  MyChart is used to connect with patients for Virtual Visits (Telemedicine).  Patients are able to view lab/test results, encounter notes, upcoming appointments, etc.  Non-urgent messages can be sent to your provider as well.   To learn more about what you can do with MyChart, go to [email or web address].    Your next appointment:   1 year(s)  The format for your next appointment:   In Person  Provider:   You may see Little Ishikawa, MD or one of the following Advanced Practice Providers on your designated Care Team:   Randall An, PA-C  Jacolyn Reedy, PA-C     Other Instructions Thank you for choosing [facility]!    Important Information  About Sugar         Signed, Little Ishikawa, MD  04/19/2022 2:46 PM    [facility]

## 2022-04-19 ENCOUNTER — Encounter: Payer: Self-pay | Admitting: Cardiology

## 2022-04-19 ENCOUNTER — Ambulatory Visit (INDEPENDENT_AMBULATORY_CARE_PROVIDER_SITE_OTHER): Payer: BC Managed Care – PPO | Admitting: Cardiology

## 2022-04-19 VITALS — BP 106/58 | HR 86 | Ht 62.0 in | Wt 188.0 lb

## 2022-04-19 DIAGNOSIS — G4733 Obstructive sleep apnea (adult) (pediatric): Secondary | ICD-10-CM

## 2022-04-19 DIAGNOSIS — R002 Palpitations: Secondary | ICD-10-CM

## 2022-04-19 DIAGNOSIS — R0602 Shortness of breath: Secondary | ICD-10-CM

## 2022-04-19 DIAGNOSIS — R079 Chest pain, unspecified: Secondary | ICD-10-CM

## 2022-04-19 DIAGNOSIS — I288 Other diseases of pulmonary vessels: Secondary | ICD-10-CM

## 2022-04-19 NOTE — Patient Instructions (Signed)
Medication Instructions:  Your physician recommends that you continue on your current medications as directed. Please refer to the Current Medication list given to you today.  *If you need a refill on your cardiac medications before your next appointment, please call your pharmacy*   Lab Work: NONE   If you have labs (blood work) drawn today and your tests are completely normal, you will receive your results only by: MyChart Message (if you have MyChart) OR A paper copy in the mail If you have any lab test that is abnormal or we need to change your treatment, we will call you to review the results.   Testing/Procedures: NONE    Follow-Up: At [facility], you and your health needs are our priority.  As part of our continuing mission to provide you with exceptional heart care, we have created designated Provider Care Teams.  These Care Teams include your primary Cardiologist (physician) and Advanced Practice Providers (APPs -  Physician Assistants and Nurse Practitioners) who all work together to provide you with the care you need, when you need it.  We recommend signing up for the patient portal called "MyChart".  Sign up information is provided on this After Visit Summary.  MyChart is used to connect with patients for Virtual Visits (Telemedicine).  Patients are able to view lab/test results, encounter notes, upcoming appointments, etc.  Non-urgent messages can be sent to your provider as well.   To learn more about what you can do with MyChart, go to [email or web address].    Your next appointment:   1 year(s)  The format for your next appointment:   In Person  Provider:   You may see Little Ishikawa, MD or one of the following Advanced Practice Providers on your designated Care Team:   Randall An, PA-C  Jacolyn Reedy, PA-C     Other Instructions Thank you for choosing [facility]!    Important Information About Sugar

## 2022-05-08 ENCOUNTER — Other Ambulatory Visit: Payer: Self-pay | Admitting: Gastroenterology

## 2022-05-08 DIAGNOSIS — R197 Diarrhea, unspecified: Secondary | ICD-10-CM

## 2022-05-11 DIAGNOSIS — G4733 Obstructive sleep apnea (adult) (pediatric): Secondary | ICD-10-CM | POA: Diagnosis not present

## 2022-05-12 ENCOUNTER — Encounter: Payer: Self-pay | Admitting: Orthopedic Surgery

## 2022-05-12 ENCOUNTER — Ambulatory Visit (INDEPENDENT_AMBULATORY_CARE_PROVIDER_SITE_OTHER): Payer: BC Managed Care – PPO | Admitting: Orthopedic Surgery

## 2022-05-12 DIAGNOSIS — M7661 Achilles tendinitis, right leg: Secondary | ICD-10-CM

## 2022-05-12 MED ORDER — INDOMETHACIN 25 MG PO CAPS: 25.0000 mg | ORAL_CAPSULE | Freq: Every day | ORAL | 0 refills | Status: DC

## 2022-05-12 NOTE — Progress Notes (Signed)
Chief Complaint  Patient presents with   Ankle Pain    Right/ foot ankle pain in boot, states no improvement     Achilles tendinitis.  Patient improved proximal over-the-counter NSAIDs as well no real change in overall pain  Some mild plantar pain most the pain is above the Achilles tendon itself in the muscle belly.  Noticed with stretching and stairs especially dorsiflexing the foot  I does not see MRI being needed there is no surgery that is needed right now recommend stretching heel lift come back in 6 weeks try anti-inflammatories  Meds ordered this encounter  Medications   indomethacin (INDOCIN) 25 MG capsule    Sig: Take 1 capsule (25 mg total) by mouth daily at 12 noon.    Dispense:  42 capsule    Refill:  0

## 2022-05-25 DIAGNOSIS — M79671 Pain in right foot: Secondary | ICD-10-CM | POA: Diagnosis not present

## 2022-05-25 DIAGNOSIS — M7661 Achilles tendinitis, right leg: Secondary | ICD-10-CM | POA: Diagnosis not present

## 2022-06-01 DIAGNOSIS — M7660 Achilles tendinitis, unspecified leg: Secondary | ICD-10-CM | POA: Diagnosis not present

## 2022-06-01 DIAGNOSIS — M25471 Effusion, right ankle: Secondary | ICD-10-CM | POA: Diagnosis not present

## 2022-06-08 DIAGNOSIS — M7661 Achilles tendinitis, right leg: Secondary | ICD-10-CM | POA: Diagnosis not present

## 2022-06-08 DIAGNOSIS — M79671 Pain in right foot: Secondary | ICD-10-CM | POA: Diagnosis not present

## 2022-06-11 DIAGNOSIS — G4733 Obstructive sleep apnea (adult) (pediatric): Secondary | ICD-10-CM | POA: Diagnosis not present

## 2022-06-14 NOTE — Progress Notes (Signed)
@Patient  ID: , female    DOB: 25-Jun-1976, 46 y.o.   MRN: [id number]  Chief Complaint  Patient presents with   Follow-up    Referring provider: No ref. provider found  HPI: 46 year old female.  Past medical history significant for mild OSA, GERD, early satiety, anemia, anxiety.  Patient of Dr. 59, last seen for initial sleep consult in November 2022 due to snoring. Epworth 15.   Previous [facility] pulmonary encounter:  12/13/2021 Patient presents today to review sleep study results. Patent has symptoms of snoring. Patient had home sleep study on 11/30/2021 that showed mild obstructive sleep apnea, AHI 5.6 with SPO2 low 89% (average 96%).  We reviewed sleep study results, risks of untreated sleep apnea and treatment options.  Patient has elected to work on weight loss, positional sleep therapy and is open to consult with orthodontic dentist for possible oral appliance to treat OSA. Her weight has increased 30-40lbs over the last year or two. She will consider referral to healthy weight and wellness. She does not drink alcohol.   Mild sleep apnea - Patient has symptoms of snoring. Epworth 15. - HST  11/30/2021 >> AHI 5.6 with SPO2 low 89%  - Reviewed sleep study results, risks of untreated sleep apnea and treatment options.  Patient has elected to work on weight loss, positional sleep therapy and is open to consult with orthodontics for possible oral appliance to treat OSA - Referral placed to Dr. 12/02/2021 for oral appliance  - Advised against drinking alcohol or taking sedating medication prior to bedtime and driving if experiencing excessive fatigue or daytime sleepiness - Follow-up in 6 months or sooner if needed   Obesity - Encourage weight loss effort, patient will consider referral to healthy weight and wellness   Early satiety - Encourage small frequent meals - Following with GI    06/15/2022- Interim hx  Patient presents today for 6 month follow-up for sleep apnea. HST   11/30/2021 >> AHI 5.6 with SPO2 low 89%. After reviewing treatment options she was not interested in oral appliance. She was started on auto CPAP 5-15cm h20 in June 2023. She received CPAP machine in July. For the most part she is doing well. She has good and bad nights. Some night she sleeps great. She is needing more time to get used to CPAP. She does report feeling less tired. She is using hybrid full face mask. She tried nasal pillows but did not tolerate.   She was diagnosed and treated for H. Pylori. She is following with GI, she has a follow up on Oct 6th.   Airview download 05/16/22-06/14/22 Usage 12/30 days; 23% > 4 hours Average usage days used 4 hours 24 mins Pressure 5-15cm h20 (6.6cm h20-95%) Airleaks 34.8L/min AHI 0.6   Allergies  Allergen Reactions   Codeine Nausea Only     There is no immunization history on file for this patient.  Past Medical History:  Diagnosis Date   Anxiety    Depression    GERD (gastroesophageal reflux disease)    Helicobacter pylori gastritis 10/2021   Treated with generic Prevpac.   Migraines    Pancreatitis    Sleep apnea     Tobacco History: Social History   Tobacco Use  Smoking Status Never   Passive exposure: Past  Smokeless Tobacco Never   Counseling given: Not Answered   Outpatient Medications Prior to Visit  Medication Sig Dispense Refill   celecoxib (CELEBREX) 100 MG capsule Take 1 capsule (100 mg total)  by mouth 2 (two) times daily. 20 capsule 0   cholestyramine (QUESTRAN) 4 g packet MIX 1 PACKET WITH 4 OUNCES OF WATER EVERY DAY WITH BREAKFAST, AND TAKE OTHER MEDS 4 TO 6 HOURS LATER 90 each 1   ferrous sulfate 325 (65 FE) MG tablet Take 325 mg by mouth daily with breakfast.     fluticasone (FLONASE) 50 MCG/ACT nasal spray Place 2 sprays into both nostrils daily. 16 g 12   Fremanezumab-vfrm (AJOVY) 225 MG/1.5ML SOAJ INJECT 225MG  INTO THE SKIN EVERY 30 DAYS 1.5 mL 11   gabapentin (NEURONTIN) 100 MG capsule TAKE 1  CAPSULE(100 MG) BY MOUTH TWICE DAILY 180 capsule 1   ibuprofen (ADVIL) 200 MG tablet Take 400 mg by mouth every 8 (eight) hours as needed for headache or moderate pain.     indomethacin (INDOCIN) 25 MG capsule Take 1 capsule (25 mg total) by mouth daily at 12 noon. 42 capsule 0   levocetirizine (XYZAL) 5 MG tablet Take 1 tablet (5 mg total) by mouth every evening. 90 tablet 3   olopatadine (PATANOL) 0.1 % ophthalmic solution Place 1 drop into both eyes 2 (two) times daily. 5 mL 12   ondansetron (ZOFRAN) 4 MG tablet Take 1 tablet (4 mg total) by mouth every 8 (eight) hours as needed for nausea or vomiting. 30 tablet 1   pseudoephedrine (SUDAFED) 60 MG tablet Take 1 tablet (60 mg total) by mouth every 8 (eight) hours as needed for congestion. 30 tablet 0   No facility-administered medications prior to visit.   Review of Systems  Review of Systems  Constitutional: Negative.   HENT: Negative.         Dry mouth  Respiratory: Negative.     Physical Exam  BP 112/70 (BP Location: Left Arm, Patient Position: Sitting, Cuff Size: Normal)   Pulse 73   Temp 98 F (36.7 C) (Oral)   Ht 5\' 2"  (1.575 m)   Wt 188 lb 6.4 oz (85.5 kg)   SpO2 100%   BMI 34.46 kg/m  Physical Exam Constitutional:      Appearance: Normal appearance.  HENT:     Head: Normocephalic and atraumatic.     Mouth/Throat:     Mouth: Mucous membranes are moist.     Pharynx: Oropharynx is clear.  Cardiovascular:     Rate and Rhythm: Normal rate and regular rhythm.  Pulmonary:     Effort: Pulmonary effort is normal.     Breath sounds: Normal breath sounds.  Skin:    General: Skin is warm and dry.  Neurological:     General: No focal deficit present.     Mental Status: She is alert and oriented to person, place, and time. Mental status is at baseline.  Psychiatric:        Mood and Affect: Mood normal.        Behavior: Behavior normal.        Thought Content: Thought content normal.        Judgment: Judgment normal.       Lab Results:  CBC    Component Value Date/Time   WBC 6.6 03/15/2022 1020   RBC 3.56 (L) 03/15/2022 1020   HGB 10.6 (L) 03/15/2022 1020   HCT 32.4 (L) 03/15/2022 1020   PLT 334 03/15/2022 1020   MCV 91.0 03/15/2022 1020   MCH 29.8 03/15/2022 1020   MCHC 32.7 03/15/2022 1020   RDW 12.7 03/15/2022 1020   LYMPHSABS 2,792 03/15/2022 1020   MONOABS 0.6 01/23/2015  0158   EOSABS 158 03/15/2022 1020   BASOSABS 33 03/15/2022 1020    BMET    Component Value Date/Time   NA 141 09/23/2021 1403   NA 140 08/05/2019 1215   K 3.7 09/23/2021 1403   CL 106 09/23/2021 1403   CO2 26 09/23/2021 1403   GLUCOSE 88 09/23/2021 1403   BUN 10 09/23/2021 1403   BUN 7 08/05/2019 1215   CREATININE 0.85 09/23/2021 1403   CALCIUM 9.4 09/23/2021 1403   GFRNONAA >60 06/30/2021 1457   GFRNONAA 73 08/26/2019 1551   GFRAA 85 08/26/2019 1551    BNP No results found for: "BNP"  ProBNP No results found for: "PROBNP"  Imaging: No results found.   Assessment & Plan:   Mild sleep apnea - HST 11/30/21 showed mild OSA, AHI 5.6/hour. She did not want to try oral appliance. Started on CPAP in June 2023. Struggling some with compliance, still getting used to wearing CPAP. Complains of dry mouth. Advised she adjust humidification level. Pressure 5-15cm h20; Residual AHI 2.4/hr. We will also decrease pressure settings. FU in 3 months.   H. pylori infection - Treated. Following with GI, next apt in October    Glenford Bayley, NP 06/18/2022

## 2022-06-15 ENCOUNTER — Ambulatory Visit (INDEPENDENT_AMBULATORY_CARE_PROVIDER_SITE_OTHER): Payer: BC Managed Care – PPO | Admitting: Primary Care

## 2022-06-15 ENCOUNTER — Encounter: Payer: Self-pay | Admitting: Primary Care

## 2022-06-15 DIAGNOSIS — A048 Other specified bacterial intestinal infections: Secondary | ICD-10-CM

## 2022-06-15 DIAGNOSIS — G4730 Sleep apnea, unspecified: Secondary | ICD-10-CM

## 2022-06-15 NOTE — Patient Instructions (Signed)
Recommendations - Aim to wear CPAP every night for minimum 4 hours or longer  - Try increasing humidification settings on CPAP to help with mouth dryness.  You can also try Biotene mouthwash at bedtime.    Orders: - Change CPAP pressure 5 to 10 cm H2O  Follow-up - 3 months with Beth NP or sooner   CPAP and BIPAP Information CPAP and BIPAP are methods that use air pressure to keep your airways open and to help you breathe well. CPAP and BIPAP use different amounts of pressure. Your health care provider will tell you whether CPAP or BIPAP would be more helpful for you. CPAP stands for "continuous positive airway pressure." With CPAP, the amount of pressure stays the same while you breathe in (inhale) and out (exhale). BIPAP stands for "bi-level positive airway pressure." With BIPAP, the amount of pressure will be higher when you inhale and lower when you exhale. This allows you to take larger breaths. CPAP or BIPAP may be used in the hospital, or your health care provider may want you to use it at home. You may need to have a sleep study before your health care provider can order a machine for you to use at home. What are the advantages? CPAP or BIPAP can be helpful if you have: Sleep apnea. Chronic obstructive pulmonary disease (COPD). Heart failure. Medical conditions that cause muscle weakness, including muscular dystrophy or amyotrophic lateral sclerosis (ALS). Other problems that cause breathing to be shallow, weak, abnormal, or difficult. CPAP and BIPAP are most commonly used for obstructive sleep apnea (OSA) to keep the airways from collapsing when the muscles relax during sleep. What are the risks? Generally, this is a safe treatment. However, problems may occur, including: Irritated skin or skin sores if the mask does not fit properly. Dry or stuffy nose or nosebleeds. Dry mouth. Feeling gassy or bloated. Sinus or lung infection if the equipment is not cleaned properly. When  should CPAP or BIPAP be used? In most cases, the mask only needs to be worn during sleep. Generally, the mask needs to be worn throughout the night and during any daytime naps. People with certain medical conditions may also need to wear the mask at other times, such as when they are awake. Follow instructions from your health care provider about when to use the machine. What happens during CPAP or BIPAP?  Both CPAP and BIPAP are provided by a small machine with a flexible plastic tube that attaches to a plastic mask that you wear. Air is blown through the mask into your nose or mouth. The amount of pressure that is used to blow the air can be adjusted on the machine. Your health care provider will set the pressure setting and help you find the best mask for you. Tips for using the mask Because the mask needs to be snug, some people feel trapped or closed-in (claustrophobic) when first using the mask. If you feel this way, you may need to get used to the mask. One way to do this is to hold the mask loosely over your nose or mouth and then gradually apply the mask more snugly. You can also gradually increase the amount of time that you use the mask. Masks are available in various types and sizes. If your mask does not fit well, talk with your health care provider about getting a different one. Some common types of masks include: Full face masks, which fit over the mouth and nose. Nasal masks, which fit  over the nose. Nasal pillow or prong masks, which fit into the nostrils. If you are using a mask that fits over your nose and you tend to breathe through your mouth, a chin strap may be applied to help keep your mouth closed. Use a skin barrier to protect your skin as told by your health care provider. Some CPAP and BIPAP machines have alarms that may sound if the mask comes off or develops a leak. If you have trouble with the mask, it is very important that you talk with your health care provider about  finding a way to make the mask easier to tolerate. Do not stop using the mask. There could be a negative impact on your health if you stop using the mask. Tips for using the machine Place your CPAP or BIPAP machine on a secure table or stand near an electrical outlet. Know where the on/off switch is on the machine. Follow instructions from your health care provider about how to set the pressure on your machine and when you should use it. Do not eat or drink while the CPAP or BIPAP machine is on. Food or fluids could get pushed into your lungs by the pressure of the CPAP or BIPAP. For home use, CPAP and BIPAP machines can be rented or purchased through home health care companies. Many different brands of machines are available. Renting a machine before purchasing may help you find out which particular machine works well for you. Your health insurance company may also decide which machine you may get. Keep the CPAP or BIPAP machine and attachments clean. Ask your health care provider for specific instructions. Check the humidifier if you have a dry stuffy nose or nosebleeds. Make sure it is working correctly. Follow these instructions at home: Take over-the-counter and prescription medicines only as told by your health care provider. Ask if you can take sinus medicine if your sinuses are blocked. Do not use any products that contain nicotine or tobacco. These products include cigarettes, chewing tobacco, and vaping devices, such as e-cigarettes. If you need help quitting, ask your health care provider. Keep all follow-up visits. This is important. Contact a health care provider if: You have redness or pressure sores on your head, face, mouth, or nose from the mask or head gear. You have trouble using the CPAP or BIPAP machine. You cannot tolerate wearing the CPAP or BIPAP mask. Someone tells you that you snore even when wearing your CPAP or BIPAP. Get help right away if: You have trouble  breathing. You feel confused. Summary CPAP and BIPAP are methods that use air pressure to keep your airways open and to help you breathe well. If you have trouble with the mask, it is very important that you talk with your health care provider about finding a way to make the mask easier to tolerate. Do not stop using the mask. There could be a negative impact to your health if you stop using the mask. Follow instructions from your health care provider about when to use the machine. This information is not intended to replace advice given to you by your health care provider. Make sure you discuss any questions you have with your health care provider. Document Revised: 04/14/2021 Document Reviewed: 08/14/2020 Elsevier Patient Education  2023 [employer].

## 2022-06-15 NOTE — Assessment & Plan Note (Signed)
-   HST 11/30/21 showed mild OSA, AHI 5.6/hour. She did not want to try oral appliance. Started on CPAP in June 2023. Struggling some with compliance, still getting used to wearing CPAP. Complains of dry mouth. Advised she adjust humidification level. Pressure 5-15cm h20; Residual AHI 2.4/hr. We will also decrease pressure settings. FU in 3 months.

## 2022-06-18 NOTE — Assessment & Plan Note (Signed)
-   Treated. Following with GI, next apt in October

## 2022-06-21 ENCOUNTER — Other Ambulatory Visit: Payer: Self-pay | Admitting: Orthopedic Surgery

## 2022-06-21 DIAGNOSIS — M7661 Achilles tendinitis, right leg: Secondary | ICD-10-CM

## 2022-06-23 ENCOUNTER — Ambulatory Visit: Payer: BC Managed Care – PPO | Admitting: Orthopedic Surgery

## 2022-06-23 NOTE — Progress Notes (Signed)
Primary Care Physician:  Pcp, No  Primary GI: Dr. Jena Gauss  Patient Location: Home   Provider Location: [facility] office   Reason for Visit: Follow-up   Persons present on the virtual encounter, with roles: Ermalinda Memos, PA-C (Provider), Tonya Love (patient)   Total time (minutes) spent on medical discussion: 10 minutes  Virtual Visit via video Note  I connected with Tonya Love on 06/24/22 at  9:00 AM EDT by video and verified that I am speaking with the correct person using two identifiers.   I discussed the limitations, risks, security and privacy concerns of performing an evaluation and management service by video and the availability of in person appointments. I also discussed with the patient that there may be a patient responsible charge related to this service. The patient expressed understanding and agreed to proceed.  Chief Complaint  Patient presents with   Follow-up    Still feels nauseated, gets full fast. Can't eat      History of Present Illness: Tonya Love is a 46 y.o. female presenting today for follow-up of nausea, early satiety, and H pylori.  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 difficile and GI pathogen panel negative in December 2020, toilet tissue hematochezia in the setting of hemorrhoids, mild anemia with iron panel on the low end of normal suspected to be influenced by menorrhagia. TCS and EGD February 2021 with diverticulosis in sigmoid colon, minimal grade 2 hemorrhoids, and benign random colon biopsies with recommendations to repeat in 10 years.  EGD with mild Schatzki ring s/p dilation, abnormal distal esophageal mucosa with benign biopsy, small hiatal hernia, otherwise normal exam. GES wnl in April 2021.  Celiac serologies negative. Thyroid  function low in March 2021, but normal in October 2022. CT A/P with contrast 05/08/2020 with no significant findings.    She developed new onset left flank/left back pain in December 2022.  Also with TTP in the epigastric and LUQ region.  CT A/P with contrast completed September 28, 2021 with no acute findings.  Later developed postprandial nausea and intermittent vomiting in January.  Underwent EGD 11/12/2021 revealing Schatzki's ring dilated, small hiatal hernia, erythematous mucosa in the gastric antrum.  Esophageal biopsies revealed reactive squamous mucosa with increased intraepithelial lymphocytes and rare eosinophils.  Gastric biopsy with chronic gastritis with H. pylori.  Last seen in our office 02/24/2022.  She reported early satiety and lack of appetite for the last 3 to 4 months.  Little nausea after eating 3 or 4 bites.  Prior LUQ abdominal pain had resolved.  She did note a little improvement in her GI symptoms for couple of weeks after completing antibiotic course (Prevpac) for H. pylori.  Typical GERD symptoms well controlled on Protonix twice daily.  Diarrhea was well controlled with Questran 4 g daily voiding diary.  Query whether persistent early satiety and nausea was related to persistent H. pylori infection.  Also noted that her symptoms initially started back in December following a viral respiratory illness and she also recently had a viral gastroenteritis 1 month prior and queried whether she may have developed postinfectious gastroparesis.  Plan to complete H. pylori breath test.  If negative, update gastric emptying study.  Also updated CBC and iron panel.  CBC showed hemoglobin of 10.6, iron panel within normal limits.  H. pylori breath test was positive.  Initially recommended quadruple therapy with bismuth subsalicylate, tetracycline, metronidazole, and PPI twice daily.  Spoke with patient on 03/21/2022.  She reported nausea, vomiting, frequent diarrhea with her current course of  antibiotics.  Recommended stopping those antibiotics and transitioning to levofloxacin, amoxicillin, and PPI twice daily.  Zofran provided for nausea.   Today:  H pylori:  Completed antibiotic course.   Nausea/early satiety:  Still has nausea when she eats. Feels full quickly after a few bites. No vomiting. No weight loss. Feel like she has gained weight. Still eating about the same. Will force herself to eat. Gets hungry late at night. No abdominal pain.   GERD:  Well controlled on PPI BID.  No dysphagia.  Diarrhea:  Well controlled on Questran and lactose free diet.    Past Medical History:  Diagnosis Date   Anxiety    Depression    GERD (gastroesophageal reflux disease)    Helicobacter pylori gastritis 10/2021   Treated with generic Prevpac, then treated with levofloxacin, amoxicillin, and PPI BID (July 2023)   Migraines    Pancreatitis    Sleep apnea      Past Surgical History:  Procedure Laterality Date   BIOPSY  11/06/2019   Procedure: BIOPSY;  Surgeon: Daneil Dolin, MD;  [facility];  Service: Endoscopy;;   BIOPSY  11/12/2021   Procedure: BIOPSY;  Surgeon: Eloise Harman, DO;  [facility];  Service: Endoscopy;;   CHOLECYSTECTOMY     COLONOSCOPY N/A 11/06/2019   Procedure: COLONOSCOPY;  Surgeon: Daneil Dolin, diverticulosis in the sigmoid colon, minimal grade 2 hemorrhoids, otherwise normal.  Segmental biopsies benign.  Repeat colonoscopy in 10 years.   ESOPHAGOGASTRODUODENOSCOPY N/A 11/06/2019   Procedure: ESOPHAGOGASTRODUODENOSCOPY (EGD);  Surgeon: Daneil Dolin, MD; mild Schatzki's ring s/p dilation, abnormal distal esophageal mucosa with benign biopsies, small hiatal hernia, otherwise normal.   ESOPHAGOGASTRODUODENOSCOPY (EGD) WITH PROPOFOL N/A 11/12/2021   Surgeon: Eloise Harman, DO; small hiatal hernia, mild Schatzki's ring dilated, esophageal biopsies with reflux changes, H. pylori gastritis.  Treated with Prevpac.    MALONEY DILATION N/A 11/06/2019   Procedure: Venia Minks DILATION;  Surgeon: Daneil Dolin, MD;  [facility];  Service: Endoscopy;  Laterality: N/A;   TUBAL LIGATION       Current Meds  Medication Sig   celecoxib (CELEBREX) 100 MG capsule Take 1 capsule (100 mg total) by mouth 2 (two) times daily.   cholestyramine (QUESTRAN) 4 g packet MIX 1 PACKET WITH 4 OUNCES OF WATER EVERY DAY WITH BREAKFAST, AND TAKE OTHER MEDS 4 TO 6 HOURS LATER   ferrous sulfate 325 (65 FE) MG tablet Take 325 mg by mouth daily with breakfast.   fluticasone (FLONASE) 50 MCG/ACT nasal spray Place 2 sprays into both nostrils daily.   Fremanezumab-vfrm (AJOVY) 225 MG/1.5ML SOAJ INJECT 225MG  INTO THE SKIN EVERY 30 DAYS   gabapentin (NEURONTIN) 100 MG capsule TAKE 1 CAPSULE(100 MG) BY MOUTH TWICE DAILY   indomethacin (INDOCIN) 25 MG capsule TAKE 1 CAPSULE BY MOUTH DAILY AT 12PM   levocetirizine (XYZAL) 5 MG tablet Take 1 tablet (5 mg total) by mouth every evening.   olopatadine (PATANOL) 0.1 % ophthalmic solution Place 1 drop into both eyes 2 (two) times daily.   pantoprazole (PROTONIX) 40 MG tablet Take 40 mg by mouth 2 (two) times daily.     Family History  Problem Relation Age of Onset   Hypertension Mother    Cancer Mother    Alcohol abuse  Father    Alcohol abuse Paternal Grandfather    Depression Maternal Aunt    Depression Paternal Aunt    Colon cancer Neg Hx    Pancreatic cancer Neg Hx     Social History   Socioeconomic History   Marital status: Legally Separated    Spouse name: Not on file   Number of children: Not on file   Years of education: Not on file   Highest education level: Not on file  Occupational History   Not on file  Tobacco Use   Smoking status: Never    Passive exposure: Past   Smokeless tobacco: Never  Vaping Use   Vaping Use: Never used  Substance and Sexual Activity   Alcohol use: No   Drug use: No   Sexual activity: Not Currently  Other Topics Concern   Not  on file  Social History Narrative   Not on file   Social Determinants of Health   Financial Resource Strain: Not on file  Food Insecurity: Not on file  Transportation Needs: Not on file  Physical Activity: Not on file  Stress: Not on file  Social Connections: Not on file       Review of Systems: Gen: Denies fever, chills.  Admits to nasal congestion and sneezing x1 day. CV: Denies chest pain, palpitations. Resp: Denies dyspnea, cough.  GI: see HPI Heme: See HPI  Observations/Objective: No distress. Alert and oriented. Pleasant. Well nourished. Normal mood and affect. Unable to perform complete physical exam due to video encounter.    Assessment:  46 year old female with history of GERD, nausea, early satiety, H. pylori, dysphagia in setting of Schatzki's ring previously dilated, chronic diarrhea, intermittent toilet tissue hematochezia in the setting of hemorrhoids, diverticulitis in 2016 and 2020, mild anemia suspected to be secondary to menorrhagia, presenting today for follow-up of nausea, early satiety, and H. pylori.  H. pylori gastritis: H. pylori gastritis diagnosed at the time of EGD 11/12/2021.  She was treated initially with Prevpac.  Unfortunately, follow-up H. pylori breath test was positive in late June.  She was prescribed bismuth, tetracycline, metronidazole, and continued on PPI twice daily, but this regimen caused GI upset and she was transition to levofloxacin, amoxicillin, PPI twice daily on 03/21/2022 and reports completing antibiotic course.  We will plan to check for H. pylori eradication with repeat breath test after holding pantoprazole for 2 weeks.  Nausea/early satiety: Symptoms of postprandial nausea and early satiety without abdominal pain started in early 2023.  Later found to have H. pylori gastritis on EGD in February and recently completed a second course of antibiotics in July due to persistent H. pylori infection, but reports no improvement in nausea  and early satiety.  Chronic GERD is well controlled.  Denies weight loss.  History of cholecystectomy.  CT A/P with contrast in January 2023 with no acute findings.  Query whether ongoing symptoms are secondary to persistent H. pylori infection.  Unable to rule out postinfectious gastroparesis as symptoms started after having a viral respiratory illness in December and also had viral gastroenteritis in May 2023.  We will plan to check for H. pylori eradication arrange a gastric emptying study.  GERD:  Chronic.  Well-controlled on PPI twice daily.  Chronic diarrhea: Felt to be multifactorial in the setting of bile salt diarrhea, dietary intolerances, possible IBS-D.  Extensive work-up previously with stool studies, labs, colonoscopy all unrevealing.  Symptoms well controlled on Questran 4 g daily and dairy avoidance.   Plan: H.  pylori breath test after holding pantoprazole x14 days.  Advised no antibiotics, OTC antacids, or Pepto-Bismol during this 14-day hold. Resume pantoprazole 40 mg twice daily after completing H. pylori breath test. Gastric emptying study. Continue Zofran as needed. 4-6 small meals daily. Low-fat diet.  Needs to be baked, boiled, broiled, grilled.  Stick with poultry or fish. Avoid raw, high fibrous foods. Follow-up in 3 months or sooner if needed.     I discussed the assessment and treatment plan with the patient. The patient was provided an opportunity to ask questions and all were answered. The patient agreed with the plan and demonstrated an understanding of the instructions.   The patient was advised to call back or seek an in-person evaluation if the symptoms worsen or if the condition fails to improve as anticipated.  I provided 10 minutes of video-face-to-face time during this encounter.  Ermalinda Memos, PA-C [facility]  06/24/2022

## 2022-06-24 ENCOUNTER — Encounter: Payer: Self-pay | Admitting: Gastroenterology

## 2022-06-24 ENCOUNTER — Telehealth: Payer: Self-pay | Admitting: *Deleted

## 2022-06-24 ENCOUNTER — Telehealth (INDEPENDENT_AMBULATORY_CARE_PROVIDER_SITE_OTHER): Payer: BC Managed Care – PPO | Admitting: Gastroenterology

## 2022-06-24 ENCOUNTER — Telehealth: Payer: Self-pay | Admitting: Gastroenterology

## 2022-06-24 VITALS — Ht 62.0 in | Wt 188.0 lb

## 2022-06-24 DIAGNOSIS — R110 Nausea: Secondary | ICD-10-CM | POA: Diagnosis not present

## 2022-06-24 DIAGNOSIS — K219 Gastro-esophageal reflux disease without esophagitis: Secondary | ICD-10-CM | POA: Diagnosis not present

## 2022-06-24 DIAGNOSIS — A048 Other specified bacterial intestinal infections: Secondary | ICD-10-CM

## 2022-06-24 DIAGNOSIS — R6881 Early satiety: Secondary | ICD-10-CM | POA: Diagnosis not present

## 2022-06-24 DIAGNOSIS — K529 Noninfective gastroenteritis and colitis, unspecified: Secondary | ICD-10-CM | POA: Insufficient documentation

## 2022-06-24 DIAGNOSIS — B9681 Helicobacter pylori [H. pylori] as the cause of diseases classified elsewhere: Secondary | ICD-10-CM

## 2022-06-24 MED ORDER — ONDANSETRON HCL 4 MG PO TABS: 4.0000 mg | ORAL_TABLET | Freq: Three times a day (TID) | ORAL | 1 refills | Status: AC | PRN

## 2022-06-24 NOTE — Patient Instructions (Signed)
We will arrange for you to have a gastric emptying study at [facility].  We need to complete H. pylori breath test to confirm H. pylori eradication. You will need to hold pantoprazole for 14 days, then complete H. pylori breath test at [facility]. During this 14-day hold, you also need to avoid all antibiotics, over-the-counter antacids, and Pepto-Bismol.  After completing the H. pylori breath test, you can resume pantoprazole 40 mg twice daily 30 minutes before breakfast and dinner.  You should eat 4-6 small meals daily. Follow a low-fat diet.  Meats should be baked, boiled, broiled, grilled.  Stick with poultry or fish. Avoid raw, high fibrous foods.  Continue to use Zofran as needed for nausea.  Follow-up in 3 months or sooner if needed.  Aliene Altes, PA-C [facility]

## 2022-06-24 NOTE — Telephone Encounter (Signed)
Tonya Love, you are scheduled for a virtual visit with your provider today.  Just as we do with appointments in the office, we must obtain your consent to participate.  Your consent will be active for this visit and any virtual visit you may have with one of our providers in the next 365 days.  If you have a MyChart account, I can also send a copy of this consent to you electronically.  All virtual visits are billed to your insurance company just like a traditional visit in the office.  As this is a virtual visit, video technology does not allow for your provider to perform a traditional examination.  This may limit your provider's ability to fully assess your condition.  If your provider identifies any concerns that need to be evaluated in person or the need to arrange testing such as labs, EKG, etc, we will make arrangements to do so.  Although advances in technology are sophisticated, we cannot ensure that it will always work on either your end or our end.  If the connection with a video visit is poor, we may have to switch to a telephone visit.  With either a video or telephone visit, we are not always able to ensure that we have a secure connection.   I need to obtain your verbal consent now.   Are you willing to proceed with your visit today? Patient consents to virtual video visit.

## 2022-06-24 NOTE — Telephone Encounter (Signed)
Noted. Labs sent with AVS

## 2022-06-24 NOTE — Telephone Encounter (Signed)
Courtney:  Please mail lab order for H. pylori breath test.  Orders have been placed.  Mindy/Tammy: Please arrange gastric emptying study. Dx: Early satiety, nausea without vomiting Orders have been placed.  Manuela Schwartz: Please arrange 2-month follow-up.

## 2022-06-27 NOTE — Telephone Encounter (Signed)
OV made °

## 2022-06-30 ENCOUNTER — Encounter (HOSPITAL_COMMUNITY)
Admission: RE | Admit: 2022-06-30 | Discharge: 2022-06-30 | Disposition: A | Discharge status: Home / Self Care | Payer: BC Managed Care – PPO | Source: Ambulatory Visit | Attending: Gastroenterology | Admitting: Gastroenterology

## 2022-06-30 ENCOUNTER — Encounter (HOSPITAL_COMMUNITY): Payer: Self-pay

## 2022-06-30 DIAGNOSIS — R6881 Early satiety: Secondary | ICD-10-CM | POA: Diagnosis not present

## 2022-06-30 DIAGNOSIS — R110 Nausea: Secondary | ICD-10-CM | POA: Insufficient documentation

## 2022-06-30 MED ORDER — TECHNETIUM TC 99M SULFUR COLLOID
2.0000 | Freq: Once | INTRAVENOUS | Status: AC | PRN
  Administered 2022-06-30: 2 via ORAL

## 2022-07-11 DIAGNOSIS — G4733 Obstructive sleep apnea (adult) (pediatric): Secondary | ICD-10-CM | POA: Diagnosis not present

## 2022-07-31 ENCOUNTER — Other Ambulatory Visit: Payer: Self-pay | Admitting: Orthopedic Surgery

## 2022-07-31 DIAGNOSIS — M7661 Achilles tendinitis, right leg: Secondary | ICD-10-CM

## 2022-08-02 DIAGNOSIS — A048 Other specified bacterial intestinal infections: Secondary | ICD-10-CM | POA: Diagnosis not present

## 2022-08-03 LAB — H. PYLORI BREATH TEST: H. pylori Breath Test: NOT DETECTED

## 2022-08-11 DIAGNOSIS — G4733 Obstructive sleep apnea (adult) (pediatric): Secondary | ICD-10-CM | POA: Diagnosis not present

## 2022-09-10 ENCOUNTER — Other Ambulatory Visit: Payer: Self-pay | Admitting: Orthopedic Surgery

## 2022-09-10 DIAGNOSIS — M7661 Achilles tendinitis, right leg: Secondary | ICD-10-CM

## 2022-09-10 DIAGNOSIS — G4733 Obstructive sleep apnea (adult) (pediatric): Secondary | ICD-10-CM | POA: Diagnosis not present

## 2022-09-15 ENCOUNTER — Ambulatory Visit: Payer: BC Managed Care – PPO | Admitting: Primary Care

## 2022-09-16 NOTE — Progress Notes (Unsigned)
Primary Care Physician:  Pcp, No  Primary Gastroenterologist: Gerrit Friends. Rourk, MD  Patient Location: Home Reason for Visit: ***  Persons present on the virtual encounter, with roles: Patient - Tonya Love; Provider - Brooke Bonito, NP   Total time (minutes) spent on medical discussion: *** minutes  Virtual Visit Encounter Note Visit is conducted virtually and was requested by patient.   I connected with Tonya Love on 09/16/22 at  8:00 AM EST by video*** and verified that I am speaking with the correct person using two identifiers.   I discussed the limitations, risks, security and privacy concerns of performing an evaluation and management service by video*** and the availability of in person appointments. I also discussed with the patient that there may be a patient responsible charge related to this service. The patient expressed understanding and agreed to proceed.  No chief complaint on file.    History of Present Illness: Tonya Love is a 46 y.o. 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  Prior workup for diarrhea with negative celiac serologies, low thyroid function March 2021 with normal function in October 2022.  Colonoscopy February 2021: -  EGD February 2021: -  CT A/P in August 2021 with no acute findings.  EGD 11/12/2021: -Schatzki's ring s/p dilation -Smaller hernia -Erythematous mucosa in gastric antrum -Esophageal biopsies with reactive squamous mucosa with increased intraepithelial lymphocytes and rare eosinophils -Gastric biopsy with chronic gastritis with H. Pylori -Treated  with Prevpac  Reported previous plan in June 2023 to complete gastric emptying study if H. pylori breath test negative.  Also update CBC and iron panel.   Lab 03/15/2022: Hemoglobin 10.6, MCV 91, platelets 334, iron 85, saturation 23%, ferritin 22.  H. pylori breath test positive.  She was advised to continue PPI twice daily and was treated with bismuth, tetracycline, metronidazole.  Last seen virtually 06/24/2022. ***Completed antibiotic course.  Still having nausea when she eats and early satiety after a few bites.  Denies any vomiting or weight loss.  Has to force herself to eat and gets hungry late at night.  Denies any abdominal pain.  GERD well-controlled on PPI twice daily without any dysphagia.  Diarrhea well-controlled with Questran and lactose-free diet.  Advised to complete H. pylori breath test after holding PPI for 14 days.  Advised to resume pantoprazole twice daily after completing H. pylori breath test.  GES ordered.  Gastroparesis diet advised.  Continue Zofran as needed.  Follow-up in 3 months or sooner if needed.  Gastric emptying study 06/30/2022: Normal.    Today: GERD:  Nausea/started satiety:  Chronic diarrhea:  Medications No outpatient medications have been marked as taking for the 09/21/22 encounter (Appointment) with Aida Raider, NP.     History Past Medical History:  Diagnosis Date   Anxiety    Depression    GERD (gastroesophageal reflux disease)    Helicobacter pylori gastritis 10/2021   Treated with generic Prevpac, then treated with levofloxacin, amoxicillin, and PPI BID (July 2023)   Migraines    Pancreatitis    Sleep apnea     Past Surgical History:  Procedure Laterality Date   BIOPSY  11/06/2019   Procedure: BIOPSY;  Surgeon:  Corbin Ade, MD;  [facility];  Service: Endoscopy;;   BIOPSY  11/12/2021   Procedure: BIOPSY;  Surgeon: Lanelle Bal, DO;  [facility];  Service: Endoscopy;;   CHOLECYSTECTOMY      COLONOSCOPY N/A 11/06/2019   Procedure: COLONOSCOPY;  Surgeon: Corbin Ade, diverticulosis in the sigmoid colon, minimal grade 2 hemorrhoids, otherwise normal.  Segmental biopsies benign.  Repeat colonoscopy in 10 years.   ESOPHAGOGASTRODUODENOSCOPY N/A 11/06/2019   Procedure: ESOPHAGOGASTRODUODENOSCOPY (EGD);  Surgeon: Corbin Ade, MD; mild Schatzki's ring s/p dilation, abnormal distal esophageal mucosa with benign biopsies, small hiatal hernia, otherwise normal.   ESOPHAGOGASTRODUODENOSCOPY (EGD) WITH PROPOFOL N/A 11/12/2021   Surgeon: Lanelle Bal, DO; small hiatal hernia, mild Schatzki's ring dilated, esophageal biopsies with reflux changes, H. pylori gastritis.  Treated with Prevpac.   MALONEY DILATION N/A 11/06/2019   Procedure: Elease Hashimoto DILATION;  Surgeon: Corbin Ade, MD;  [facility];  Service: Endoscopy;  Laterality: N/A;   TUBAL LIGATION      Family History  Problem Relation Age of Onset   Hypertension Mother    Cancer Mother    Alcohol abuse Father    Alcohol abuse Paternal Grandfather    Depression Maternal Aunt    Depression Paternal Aunt    Colon cancer Neg Hx    Pancreatic cancer Neg Hx     Social History   Socioeconomic History   Marital status: Legally Separated    Spouse name: Not on file   Number of children: Not on file   Years of education: Not on file   Highest education level: Not on file  Occupational History   Not on file  Tobacco Use   Smoking status: Never    Passive exposure: Past   Smokeless tobacco: Never  Vaping Use   Vaping Use: Never used  Substance and Sexual Activity   Alcohol use: No   Drug use: No   Sexual activity: Not Currently  Other Topics Concern   Not on file  Social History Narrative   Not on file   Social Determinants of Health   Financial Resource Strain: Not on file  Food Insecurity: Not on file  Transportation Needs: Not on file  Physical Activity: Not on file  Stress: Not on file   Social Connections: Not on file      Review of Systems: Gen: Denies fever, chills, anorexia. Denies fatigue, weakness, weight loss.  CV: Denies chest pain, palpitations, syncope, peripheral edema, and claudication. Resp: Denies dyspnea at rest, cough, wheezing, coughing up blood, and pleurisy. GI: see HPI Derm: Denies rash, itching, dry skin Psych: Denies depression, anxiety, memory loss, confusion. No homicidal or suicidal ideation.  Heme: Denies bruising, bleeding, and enlarged lymph nodes.  Observations/Objective: No distress. Alert and oriented. Pleasant. Well nourished. Normal mood and affect. Unable to perform complete physical exam due to video*** encounter.   Assessment:  H. pylori gastritis: Diagnosed on EGD in February 2023.  Initially treated with Prevpac however unsuccessful and then completed tetracycline, metronidazole, bismuth and PPI twice daily.  H. pylori breath test 08/02/2022 negative.  Nausea/early satiety:  Recent GES normal.  GERD:   Chronic diarrhea:   Plan:      Follow Up Instructions:  I discussed the assessment and treatment plan with the patient. The patient was provided an opportunity to ask questions and all were answered. The patient agreed with the plan and demonstrated an understanding of the instructions.   The patient  was advised to call back or seek an in-person evaluation if the symptoms worsen or if the condition fails to improve as anticipated.    Brooke Bonito, MSN, APRN, FNP-BC, AGACNP-BC [facility]

## 2022-09-21 ENCOUNTER — Telehealth: Payer: Self-pay | Admitting: *Deleted

## 2022-09-21 ENCOUNTER — Encounter: Payer: Self-pay | Admitting: Gastroenterology

## 2022-09-21 ENCOUNTER — Telehealth (INDEPENDENT_AMBULATORY_CARE_PROVIDER_SITE_OTHER): Payer: BC Managed Care – PPO | Admitting: Gastroenterology

## 2022-09-21 VITALS — Ht 66.0 in | Wt 186.0 lb

## 2022-09-21 DIAGNOSIS — K219 Gastro-esophageal reflux disease without esophagitis: Secondary | ICD-10-CM | POA: Diagnosis not present

## 2022-09-21 DIAGNOSIS — R110 Nausea: Secondary | ICD-10-CM | POA: Diagnosis not present

## 2022-09-21 DIAGNOSIS — K529 Noninfective gastroenteritis and colitis, unspecified: Secondary | ICD-10-CM

## 2022-09-21 DIAGNOSIS — R6881 Early satiety: Secondary | ICD-10-CM | POA: Diagnosis not present

## 2022-09-21 DIAGNOSIS — K2970 Gastritis, unspecified, without bleeding: Secondary | ICD-10-CM

## 2022-09-21 DIAGNOSIS — B9681 Helicobacter pylori [H. pylori] as the cause of diseases classified elsewhere: Secondary | ICD-10-CM

## 2022-09-21 NOTE — Patient Instructions (Addendum)
For your reflux: -Continue pantoprazole 40 mg twice daily, 30 minutes prior to breakfast and dinner -May use Tums or Pepcid over-the-counter as needed for any breakthrough symptoms. -Follow a GERD diet:  Avoid fried, fatty, greasy, spicy, citrus foods. Avoid caffeine and carbonated beverages. Avoid chocolate. Try eating 4-6 small meals a day rather than 3 large meals. Do not eat within 3 hours of laying down. Prop head of bed up on wood or bricks to create a 6 inch incline.  Continue to take cholestyramine 4 g once daily for your diarrhea.  May continue to use Zofran 4 mg as needed.  This can be taken up to every 8 hours as needed.  Continue to avoid lactose.  If symptoms worsen, please feel free to reach out to the office and we can consider additional workup at that time however does not seem warranted to complete a more extensive workup at this time given the intermittent nature of her nausea.  If you begin to have any significant loss of weight, inability to eat/maintain nutrition, or worsening nausea and develop vomiting please contact the office.  We will see for follow-up in about 6 months, or sooner if any new issues arise or symptoms worsen.  It was a pleasure to see you today. I want to create trusting relationships with patients. If you receive a survey regarding your visit,  I greatly appreciate you taking time to fill this out on paper or through your MyChart. I value your feedback.  Venetia Night, MSN, FNP-BC, AGACNP-BC [facility]

## 2022-09-21 NOTE — Telephone Encounter (Signed)
Tonya Love, you are scheduled for a virtual visit with your provider today.  Just as we do with appointments in the office, we must obtain your consent to participate.  Your consent will be active for this visit and any virtual visit you may have with one of our providers in the next 365 days.  If you have a MyChart account, I can also send a copy of this consent to you electronically.  All virtual visits are billed to your insurance company just like a traditional visit in the office.  As this is a virtual visit, video technology does not allow for your provider to perform a traditional examination.  This may limit your provider's ability to fully assess your condition.  If your provider identifies any concerns that need to be evaluated in person or the need to arrange testing such as labs, EKG, etc, we will make arrangements to do so.  Although advances in technology are sophisticated, we cannot ensure that it will always work on either your end or our end.  If the connection with a video visit is poor, we may have to switch to a telephone visit.  With either a video or telephone visit, we are not always able to ensure that we have a secure connection.   I need to obtain your verbal consent now.   Are you willing to proceed with your visit today? Pt consent to virtual visit.

## 2022-09-24 ENCOUNTER — Other Ambulatory Visit: Payer: Self-pay | Admitting: Adult Health

## 2022-10-03 ENCOUNTER — Encounter (INDEPENDENT_AMBULATORY_CARE_PROVIDER_SITE_OTHER): Payer: Self-pay

## 2022-10-03 ENCOUNTER — Ambulatory Visit (INDEPENDENT_AMBULATORY_CARE_PROVIDER_SITE_OTHER): Payer: BC Managed Care – PPO | Admitting: Primary Care

## 2022-10-03 ENCOUNTER — Encounter: Payer: Self-pay | Admitting: Primary Care

## 2022-10-03 VITALS — BP 104/62 | HR 78 | Ht 62.0 in | Wt 189.4 lb

## 2022-10-03 DIAGNOSIS — G4730 Sleep apnea, unspecified: Secondary | ICD-10-CM

## 2022-10-03 NOTE — Progress Notes (Signed)
@Patient  ID: , female    DOB: 11-27-1975, 47 y.o.   MRN: [id number]  Chief Complaint  Patient presents with   Follow-up    OSA     Referring provider: No ref. provider found  HPI: 47 year old female. Past medical history significant for mild OSA, GERD, early satiety, anemia, anxiety.  Patient of Dr. 54, last seen for initial sleep consult in November 2022 due to snoring. Epworth 15.   Previous [facility] pulmonary encounter:  12/13/2021 Patient presents today to review sleep study results. Patent has symptoms of snoring. Patient had home sleep study on 11/30/2021 that showed mild obstructive sleep apnea, AHI 5.6 with SPO2 low 89% (average 96%).  We reviewed sleep study results, risks of untreated sleep apnea and treatment options.  Patient has elected to work on weight loss, positional sleep therapy and is open to consult with orthodontic dentist for possible oral appliance to treat OSA. Her weight has increased 30-40lbs over the last year or two. She will consider referral to healthy weight and wellness. She does not drink alcohol.   Mild sleep apnea - Patient has symptoms of snoring. Epworth 15. - HST  11/30/2021 >> AHI 5.6 with SPO2 low 89%  - Reviewed sleep study results, risks of untreated sleep apnea and treatment options.  Patient has elected to work on weight loss, positional sleep therapy and is open to consult with orthodontics for possible oral appliance to treat OSA - Referral placed to Dr. 12/02/2021 for oral appliance  - Advised against drinking alcohol or taking sedating medication prior to bedtime and driving if experiencing excessive fatigue or daytime sleepiness - Follow-up in 6 months or sooner if needed   Obesity - Encourage weight loss effort, patient will consider referral to healthy weight and wellness   Early satiety - Encourage small frequent meals - Following with GI    06/15/2022 Patient presents today for 6 month follow-up for sleep apnea. HST   11/30/2021 >> AHI 5.6 with SPO2 low 89%. After reviewing treatment options she was not interested in oral appliance. She was started on auto CPAP 5-15cm h20 in June 2023. She received CPAP machine in July. For the most part she is doing well. She has good and bad nights. Some night she sleeps great. She is needing more time to get used to CPAP. She does report feeling less tired. She is using hybrid full face mask. She tried nasal pillows but did not tolerate.   She was diagnosed and treated for H. Pylori. She is following with GI, she has a follow up on Oct 6th.   Airview download 05/16/22-06/14/22 Usage 12/30 days; 23% > 4 hours Average usage days used 4 hours 24 mins Pressure 5-15cm h20 (6.6cm h20-95%) Airleaks 34.8L/min AHI 0.6   10/03/2022- Interim  Patient presents today for follow-up. She has mild OSA. HST  11/30/2021 >> AHI 5.6 with SPO2 low 89%. Originally referedl to orthodontics but after reviewed treatment options she was not interested in oral appliance. She was started on Auto CPAP in June. Doing well. CPAP compliance is fair. She denies issues with pressure settings or mask fit. When she uses CPAP regularly she feels better. She reports being less tired and no longer experiencing morning headaches. She is interested in weight loss options and possible medication. Walking to help.  Airview download 09/02/22-10/01/22 Usage 2/30 days Average usage days used 3 hours 16 mins Pressure 5-15 (5.8cm h20-95%) Airleaks 27L/min AHI 0.2    Allergies  Allergen Reactions  Codeine Nausea Only     There is no immunization history on file for this patient.  Past Medical History:  Diagnosis Date   Anxiety    Depression    GERD (gastroesophageal reflux disease)    Helicobacter pylori gastritis 10/2021   Treated with generic Prevpac, then treated with levofloxacin, amoxicillin, and PPI BID (July 2023)   Migraines    Pancreatitis    Sleep apnea     Tobacco History: Social History    Tobacco Use  Smoking Status Never   Passive exposure: Past  Smokeless Tobacco Never   Counseling given: Not Answered   Outpatient Medications Prior to Visit  Medication Sig Dispense Refill   cholestyramine (QUESTRAN) 4 g packet MIX 1 PACKET WITH 4 OUNCES OF WATER EVERY DAY WITH BREAKFAST, AND TAKE OTHER MEDS 4 TO 6 HOURS LATER 90 each 1   ferrous sulfate 325 (65 FE) MG tablet Take 325 mg by mouth daily with breakfast.     fluticasone (FLONASE) 50 MCG/ACT nasal spray Place 2 sprays into both nostrils daily. 16 g 12   Fremanezumab-vfrm (AJOVY) 225 MG/1.5ML SOAJ INJECT 225MG  INTO THE SKIN EVERY 30 DAYS 1.5 mL 11   gabapentin (NEURONTIN) 100 MG capsule TAKE 1 CAPSULE(100 MG) BY MOUTH TWICE DAILY 180 capsule 1   indomethacin (INDOCIN) 25 MG capsule TAKE 1 CAPSULE BY MOUTH DAILY AT 12 PM 42 capsule 0   olopatadine (PATANOL) 0.1 % ophthalmic solution Place 1 drop into both eyes 2 (two) times daily. 5 mL 12   ondansetron (ZOFRAN) 4 MG tablet Take 1 tablet (4 mg total) by mouth every 8 (eight) hours as needed for nausea or vomiting. 30 tablet 1   pantoprazole (PROTONIX) 40 MG tablet Take 40 mg by mouth 2 (two) times daily.     No facility-administered medications prior to visit.    Review of Systems  Review of Systems  Constitutional:  Positive for fatigue.  HENT: Negative.    Respiratory: Negative.    Cardiovascular: Negative.      Physical Exam  BP 104/62 (BP Location: Left Arm, Patient Position: Sitting, Cuff Size: Normal)   Pulse 78   Ht 5\' 2"  (1.575 m)   Wt 189 lb 6.4 oz (85.9 kg)   LMP 08/31/2022 (Approximate)   SpO2 100%   BMI 34.64 kg/m  Physical Exam Constitutional:      Appearance: Normal appearance.  HENT:     Head: Normocephalic and atraumatic.  Cardiovascular:     Rate and Rhythm: Normal rate.  Pulmonary:     Effort: Pulmonary effort is normal.     Breath sounds: Normal breath sounds.  Neurological:     General: No focal deficit present.     Mental  Status: She is alert and oriented to person, place, and time. Mental status is at baseline.  Psychiatric:        Mood and Affect: Mood normal.        Behavior: Behavior normal.        Thought Content: Thought content normal.        Judgment: Judgment normal.      Lab Results:  CBC    Component Value Date/Time   WBC 6.6 03/15/2022 1020   RBC 3.56 (L) 03/15/2022 1020   HGB 10.6 (L) 03/15/2022 1020   HCT 32.4 (L) 03/15/2022 1020   PLT 334 03/15/2022 1020   MCV 91.0 03/15/2022 1020   MCH 29.8 03/15/2022 1020   MCHC 32.7 03/15/2022 1020   RDW 12.7  03/15/2022 1020   LYMPHSABS 2,792 03/15/2022 1020   MONOABS 0.6 01/23/2015 0158   EOSABS 158 03/15/2022 1020   BASOSABS 33 03/15/2022 1020    BMET    Component Value Date/Time   NA 141 09/23/2021 1403   NA 140 08/05/2019 1215   K 3.7 09/23/2021 1403   CL 106 09/23/2021 1403   CO2 26 09/23/2021 1403   GLUCOSE 88 09/23/2021 1403   BUN 10 09/23/2021 1403   BUN 7 08/05/2019 1215   CREATININE 0.85 09/23/2021 1403   CALCIUM 9.4 09/23/2021 1403   GFRNONAA >60 06/30/2021 1457   GFRNONAA 73 08/26/2019 1551   GFRAA 85 08/26/2019 1551    BNP No results found for: "BNP"  ProBNP No results found for: "PROBNP"  Imaging: No results found.   Assessment & Plan:   Mild sleep apnea - Mild OSA. HST  11/30/2021 >> AHI 5.6 with SPO2 low 89%.  Originally referred for oral appliance but after learning more about treatment patient was not interested.  Started on auto CPAP in June 2023.  She continues to struggle with compliance. Denies issues with mask fit or pressure settings.  She reports benefit when wearing CPAP regularly.  She experiences less fatigue and no longer having morning headaches.   For now recommend she continue PAP therapy as she reports clinical befit when using. Encourage patient to consistently aim to wear CPAP every night for minimum 4 to 6 hours. If continues to have issues with compliance we can focus on conservative  mangement with weight loss and side sleeping position. Follow-up virtual visit in 3 months for compliance check.    Martyn Ehrich, NP 10/03/2022

## 2022-10-03 NOTE — Assessment & Plan Note (Addendum)
-  Mild OSA. HST  11/30/2021 >> AHI 5.6 with SPO2 low 89%.  Originally referred for oral appliance but after learning more about treatment patient was not interested.  Started on auto CPAP in June 2023.  She continues to struggle with compliance. Denies issues with mask fit or pressure settings.  She reports benefit when wearing CPAP regularly.  She experiences less fatigue and no longer having morning headaches.  Current pressure 5-15cm h20; Residual AHI 0.2/hour. For now recommend she continue PAP therapy as she reports clinical befit when using. Encourage patient to consistently aim to wear CPAP every night for minimum 4 to 6 hours. If continues to have issues with compliance we can focus on conservative mangement with weight loss and side sleeping position. Follow-up virtual visit in 3 months for compliance check.

## 2022-10-03 NOTE — Patient Instructions (Addendum)
Please wear CPAP every night for minimum 4 to 6 hours or longer  Referral: Healthy weight and wellness  Follow-up: 3 months virtual visit for compliance check   CPAP and BIPAP Information CPAP and BIPAP are methods that use air pressure to keep your airways open and to help you breathe well. CPAP and BIPAP use different amounts of pressure. Your health care provider will tell you whether CPAP or BIPAP would be more helpful for you. CPAP stands for "continuous positive airway pressure." With CPAP, the amount of pressure stays the same while you breathe in (inhale) and out (exhale). BIPAP stands for "bi-level positive airway pressure." With BIPAP, the amount of pressure will be higher when you inhale and lower when you exhale. This allows you to take larger breaths. CPAP or BIPAP may be used in the hospital, or your health care provider may want you to use it at home. You may need to have a sleep study before your health care provider can order a machine for you to use at home. What are the advantages? CPAP or BIPAP can be helpful if you have: Sleep apnea. Chronic obstructive pulmonary disease (COPD). Heart failure. Medical conditions that cause muscle weakness, including muscular dystrophy or amyotrophic lateral sclerosis (ALS). Other problems that cause breathing to be shallow, weak, abnormal, or difficult. CPAP and BIPAP are most commonly used for obstructive sleep apnea (OSA) to keep the airways from collapsing when the muscles relax during sleep. What are the risks? Generally, this is a safe treatment. However, problems may occur, including: Irritated skin or skin sores if the mask does not fit properly. Dry or stuffy nose or nosebleeds. Dry mouth. Feeling gassy or bloated. Sinus or lung infection if the equipment is not cleaned properly. When should CPAP or BIPAP be used? In most cases, the mask only needs to be worn during sleep. Generally, the mask needs to be worn throughout the  night and during any daytime naps. People with certain medical conditions may also need to wear the mask at other times, such as when they are awake. Follow instructions from your health care provider about when to use the machine. What happens during CPAP or BIPAP?  Both CPAP and BIPAP are provided by a small machine with a flexible plastic tube that attaches to a plastic mask that you wear. Air is blown through the mask into your nose or mouth. The amount of pressure that is used to blow the air can be adjusted on the machine. Your health care provider will set the pressure setting and help you find the best mask for you. Tips for using the mask Because the mask needs to be snug, some people feel trapped or closed-in (claustrophobic) when first using the mask. If you feel this way, you may need to get used to the mask. One way to do this is to hold the mask loosely over your nose or mouth and then gradually apply the mask more snugly. You can also gradually increase the amount of time that you use the mask. Masks are available in various types and sizes. If your mask does not fit well, talk with your health care provider about getting a different one. Some common types of masks include: Full face masks, which fit over the mouth and nose. Nasal masks, which fit over the nose. Nasal pillow or prong masks, which fit into the nostrils. If you are using a mask that fits over your nose and you tend to breathe through your  mouth, a chin strap may be applied to help keep your mouth closed. Use a skin barrier to protect your skin as told by your health care provider. Some CPAP and BIPAP machines have alarms that may sound if the mask comes off or develops a leak. If you have trouble with the mask, it is very important that you talk with your health care provider about finding a way to make the mask easier to tolerate. Do not stop using the mask. There could be a negative impact on your health if you stop using  the mask. Tips for using the machine Place your CPAP or BIPAP machine on a secure table or stand near an electrical outlet. Know where the on/off switch is on the machine. Follow instructions from your health care provider about how to set the pressure on your machine and when you should use it. Do not eat or drink while the CPAP or BIPAP machine is on. Food or fluids could get pushed into your lungs by the pressure of the CPAP or BIPAP. For home use, CPAP and BIPAP machines can be rented or purchased through home health care companies. Many different brands of machines are available. Renting a machine before purchasing may help you find out which particular machine works well for you. Your health insurance company may also decide which machine you may get. Keep the CPAP or BIPAP machine and attachments clean. Ask your health care provider for specific instructions. Check the humidifier if you have a dry stuffy nose or nosebleeds. Make sure it is working correctly. Follow these instructions at home: Take over-the-counter and prescription medicines only as told by your health care provider. Ask if you can take sinus medicine if your sinuses are blocked. Do not use any products that contain nicotine or tobacco. These products include cigarettes, chewing tobacco, and vaping devices, such as e-cigarettes. If you need help quitting, ask your health care provider. Keep all follow-up visits. This is important. Contact a health care provider if: You have redness or pressure sores on your head, face, mouth, or nose from the mask or head gear. You have trouble using the CPAP or BIPAP machine. You cannot tolerate wearing the CPAP or BIPAP mask. Someone tells you that you snore even when wearing your CPAP or BIPAP. Get help right away if: You have trouble breathing. You feel confused. Summary CPAP and BIPAP are methods that use air pressure to keep your airways open and to help you breathe well. If you  have trouble with the mask, it is very important that you talk with your health care provider about finding a way to make the mask easier to tolerate. Do not stop using the mask. There could be a negative impact to your health if you stop using the mask. Follow instructions from your health care provider about when to use the machine. This information is not intended to replace advice given to you by your health care provider. Make sure you discuss any questions you have with your health care provider. Document Revised: 04/14/2021 Document Reviewed: 08/14/2020 Elsevier Patient Education  [place].

## 2022-10-05 NOTE — Progress Notes (Signed)
Reviewed and agree with assessment/plan.   Aryona Sill, MD [facility] 10/05/2022, 10:37 AM Pager:  [phone]  

## 2022-10-11 DIAGNOSIS — G4733 Obstructive sleep apnea (adult) (pediatric): Secondary | ICD-10-CM | POA: Diagnosis not present

## 2022-10-12 DIAGNOSIS — J069 Acute upper respiratory infection, unspecified: Secondary | ICD-10-CM | POA: Diagnosis not present

## 2022-10-12 DIAGNOSIS — R070 Pain in throat: Secondary | ICD-10-CM | POA: Diagnosis not present

## 2022-10-12 DIAGNOSIS — Z20822 Contact with and (suspected) exposure to covid-19: Secondary | ICD-10-CM | POA: Diagnosis not present

## 2022-10-20 ENCOUNTER — Ambulatory Visit (INDEPENDENT_AMBULATORY_CARE_PROVIDER_SITE_OTHER): Payer: BC Managed Care – PPO | Admitting: Adult Health

## 2022-10-20 ENCOUNTER — Encounter: Payer: Self-pay | Admitting: Adult Health

## 2022-10-20 VITALS — BP 117/68 | HR 58 | Ht 62.0 in | Wt 189.0 lb

## 2022-10-20 DIAGNOSIS — G43709 Chronic migraine without aura, not intractable, without status migrainosus: Secondary | ICD-10-CM

## 2022-10-20 MED ORDER — RIZATRIPTAN BENZOATE 10 MG PO TABS: ORAL_TABLET | ORAL | 11 refills | Status: DC

## 2022-10-20 NOTE — Progress Notes (Signed)
PATIENT: Tonya Love DOB: [birth year]  REASON FOR VISIT: follow up HISTORY FROM: patient Primary neurologist: Dr. Frances Furbish Chief Complaint  Patient presents with   RM 4    Here alone for one yr migraine follow-up. Overall doing well, occasional stress related h/a but medication helps.     HISTORY OF PRESENT ILLNESS: Today 10/20/22:  Tonya Love is a 47 y.o. female with a history of Migraine headaches. Returns today for follow-up.  Overall she feels that she is doing well.  Continues on Ajovy and gabapentin for migraines.  She states occasionally she will get a stress headache.  Reports that she consistently takes over-the-counter medication with good benefit.  In the past she has been on Maxalt but states her prescription ran out so she has not used this in a while.  10/27/21: Ms. Alles is a 47 year old female with a history of migraine headaches. She returns today for follow-up. She is currently taking Ajovy and gabapentin.  She states with the addition of gabapentin she is only had 1 headache this month.  States that she is not having to use Maxalt.  Denies any new symptoms.  Returns today for an evaluation.  04/20/21:Ms. Vanmeter is a 47 year old female with a history of migraine headaches.  She continues on Ajovy and Maxalt.  She reports that she is 2 weeks behind on Ajovy due to prior authorization.  She states that she is having sharp shooting pain in the temporal regions and across the forehead.  She reports some blurry vision when she has these episodes.  Reports that they are very brief and only last for seconds.  This is only been occurring in the last month.   05/05/20: Ms. Rafalski is a 47 year old female with a history of migraine. She returns today for follow-up. She remains on Ajovy.  She reports that this controls her headache.  She typically gets headaches the week before the next dose is due.  She states that her headaches typically occur across the forehead.  She  does have photophobia and phonophobia.  On occasion she will have nausea.  She reports that Maxalt continues to give her good benefit.  On occasion she will have to take a second dose.  HISTORY 11/04/2019: She reports that the Maxalt helps, some days better than others, she needs a prescription for Zofran as she is out.  Her migraine frequency has increased a little bit, she can have 2 or 3 migraines per week, they can last 24 to 36 hours.  She has not been on preventative medication in the past but has tried as needed medication in the past.  She has had some intermittent palpitations and chest discomfort, also jitteriness.  This is not after taking Maxalt actually.  It can be when she is just sitting and she feels unwell, she has noticed on her health tracker watch that her pulse rate can go up to the 150s, yesterday she had a similar episode in her pulse rate was 158.  She has not seen her primary care physician or nurse practitioner yet for this.  She has not seen a cardiologist in the past.  She has not seen a correlation between her caffeine intake and her symptoms and also she had not skipped any meals.  She tries to hydrate well with water, drinks caffeine in the form of [facility] occasionally, not every day, does not like coffee.   The patient's allergies, current medications, family history, past  medical history, past social history, past surgical history and problem list were reviewed and updated as appropriate.      REVIEW OF SYSTEMS: Out of a complete 14 system review of symptoms, the patient complains only of the following symptoms, and all other reviewed systems are negative.  See HPI  ALLERGIES: Allergies  Allergen Reactions   Codeine Nausea Only    HOME MEDICATIONS: Outpatient Medications Prior to Visit  Medication Sig Dispense Refill   cholestyramine (QUESTRAN) 4 g packet MIX 1 PACKET WITH 4 OUNCES OF WATER EVERY DAY WITH BREAKFAST, AND TAKE OTHER MEDS 4 TO 6 HOURS LATER 90  each 1   ferrous sulfate 325 (65 FE) MG tablet Take 325 mg by mouth daily with breakfast.     fluticasone (FLONASE) 50 MCG/ACT nasal spray Place 2 sprays into both nostrils daily. 16 g 12   Fremanezumab-vfrm (AJOVY) 225 MG/1.5ML SOAJ INJECT 225MG  INTO THE SKIN EVERY 30 DAYS 1.5 mL 11   gabapentin (NEURONTIN) 100 MG capsule TAKE 1 CAPSULE(100 MG) BY MOUTH TWICE DAILY 180 capsule 1   indomethacin (INDOCIN) 25 MG capsule TAKE 1 CAPSULE BY MOUTH DAILY AT 12 PM 42 capsule 0   olopatadine (PATANOL) 0.1 % ophthalmic solution Place 1 drop into both eyes 2 (two) times daily. 5 mL 12   ondansetron (ZOFRAN) 4 MG tablet Take 1 tablet (4 mg total) by mouth every 8 (eight) hours as needed for nausea or vomiting. 30 tablet 1   pantoprazole (PROTONIX) 40 MG tablet Take 40 mg by mouth 2 (two) times daily.     No facility-administered medications prior to visit.    PAST MEDICAL HISTORY: Past Medical History:  Diagnosis Date   Anxiety    Depression    GERD (gastroesophageal reflux disease)    Helicobacter pylori gastritis 10/2021   Treated with generic Prevpac, then treated with levofloxacin, amoxicillin, and PPI BID (July 2023)   Migraines    Pancreatitis    Sleep apnea     PAST SURGICAL HISTORY: Past Surgical History:  Procedure Laterality Date   BIOPSY  11/06/2019   Procedure: BIOPSY;  Surgeon: Daneil Dolin, MD;  [facility];  Service: Endoscopy;;   BIOPSY  11/12/2021   Procedure: BIOPSY;  Surgeon: Eloise Harman, DO;  [facility]: [facility];  Service: Endoscopy;;   CHOLECYSTECTOMY     COLONOSCOPY N/A 11/06/2019   Procedure: COLONOSCOPY;  Surgeon: Daneil Dolin, diverticulosis in the sigmoid colon, minimal grade 2 hemorrhoids, otherwise normal.  Segmental biopsies benign.  Repeat colonoscopy in 10 years.   ESOPHAGOGASTRODUODENOSCOPY N/A 11/06/2019   Procedure: ESOPHAGOGASTRODUODENOSCOPY (EGD);  Surgeon: Daneil Dolin, MD; mild Schatzki's ring s/p dilation, abnormal distal  esophageal mucosa with benign biopsies, small hiatal hernia, otherwise normal.   ESOPHAGOGASTRODUODENOSCOPY (EGD) WITH PROPOFOL N/A 11/12/2021   Surgeon: Eloise Harman, DO; small hiatal hernia, mild Schatzki's ring dilated, esophageal biopsies with reflux changes, H. pylori gastritis.  Treated with Prevpac.   MALONEY DILATION N/A 11/06/2019   Procedure: Venia Minks DILATION;  Surgeon: Daneil Dolin, MD;  [facility]: [facility];  Service: Endoscopy;  Laterality: N/A;   TUBAL LIGATION      FAMILY HISTORY: Family History  Problem Relation Age of Onset   Hypertension Mother    Cancer Mother    Alcohol abuse Father    Alcohol abuse Paternal Grandfather    Depression Maternal Aunt    Depression Paternal Aunt    Colon cancer Neg Hx    Pancreatic cancer Neg  Hx     SOCIAL HISTORY: Social History   Socioeconomic History   Marital status: Legally Separated    Spouse name: Not on file   Number of children: Not on file   Years of education: Not on file   Highest education level: Not on file  Occupational History   Not on file  Tobacco Use   Smoking status: Never    Passive exposure: Past   Smokeless tobacco: Never  Vaping Use   Vaping Use: Never used  Substance and Sexual Activity   Alcohol use: No   Drug use: No   Sexual activity: Not Currently  Other Topics Concern   Not on file  Social History Narrative   Not on file   Social Determinants of Health   Financial Resource Strain: Not on file  Food Insecurity: Not on file  Transportation Needs: Not on file  Physical Activity: Not on file  Stress: Not on file  Social Connections: Not on file  Intimate Partner Violence: Not on file      PHYSICAL EXAM  Vitals:   10/20/22 1005  BP: 117/68  Pulse: (!) 58  Weight: 189 lb (85.7 kg)  Height: 5\' 2"  (1.575 m)     Body mass index is 34.57 kg/m.  Generalized: Well developed, in no acute distress   Neurological examination  Mentation: Alert oriented to time,  place, history taking. Follows all commands speech and language fluent Cranial nerve II-XII: Pupils were equal round reactive to light. Extraocular movements were full, visual field were full on confrontational test.. Head turning and shoulder shrug  were normal and symmetric. Motor: The motor testing reveals 5 over 5 strength of all 4 extremities. Good symmetric motor tone is noted throughout.  Sensory: Sensory testing is intact to soft touch on all 4 extremities. No evidence of extinction is noted.  Coordination: Cerebellar testing reveals good finger-nose-finger and heel-to-shin bilaterally.  Gait and station: Gait is normal.  Reflexes: Deep tendon reflexes are symmetric and normal bilaterally.   DIAGNOSTIC DATA (LABS, IMAGING, TESTING) - I reviewed patient records, labs, notes, testing and imaging myself where available.  Lab Results  Component Value Date   WBC 6.6 03/15/2022   HGB 10.6 (L) 03/15/2022   HCT 32.4 (L) 03/15/2022   MCV 91.0 03/15/2022   PLT 334 03/15/2022      Component Value Date/Time   NA 141 09/23/2021 1403   NA 140 08/05/2019 1215   K 3.7 09/23/2021 1403   CL 106 09/23/2021 1403   CO2 26 09/23/2021 1403   GLUCOSE 88 09/23/2021 1403   BUN 10 09/23/2021 1403   BUN 7 08/05/2019 1215   CREATININE 0.85 09/23/2021 1403   CALCIUM 9.4 09/23/2021 1403   PROT 6.6 09/23/2021 1403   PROT 7.0 08/05/2019 1215   ALBUMIN 4.3 08/05/2019 1215   AST 15 09/23/2021 1403   ALT 10 09/23/2021 1403   ALKPHOS 72 08/05/2019 1215   BILITOT 0.3 09/23/2021 1403   BILITOT 0.3 08/05/2019 1215   GFRNONAA >60 06/30/2021 1457   GFRNONAA 73 08/26/2019 1551   GFRAA 85 08/26/2019 1551    Lab Results  Component Value Date   TSH 0.815 06/30/2021      ASSESSMENT AND PLAN 47 y.o. year old female  has a past medical history of Anxiety, Depression, GERD (gastroesophageal reflux disease), Helicobacter pylori gastritis (10/2021), Migraines, Pancreatitis, and Sleep apnea. here with:  1.   Migraine headaches  Continue Ajovy Continue gabapentin 100 mg BID Continue Maxalt for  abortive therapy Advised if headaches worsen or she develops new symptoms she should let us know. Follow-up in 1 year or sooner if needed    Ward Givens, MSN, NP-C 10/20/2022, 10:02 AM [facility] [address] [place], [place] [address] [phone]

## 2022-10-23 ENCOUNTER — Other Ambulatory Visit: Payer: Self-pay | Admitting: Orthopedic Surgery

## 2022-10-23 DIAGNOSIS — M7661 Achilles tendinitis, right leg: Secondary | ICD-10-CM

## 2022-10-27 ENCOUNTER — Ambulatory Visit: Payer: BC Managed Care – PPO | Admitting: Adult Health

## 2022-11-11 DIAGNOSIS — G4733 Obstructive sleep apnea (adult) (pediatric): Secondary | ICD-10-CM | POA: Diagnosis not present

## 2022-11-17 ENCOUNTER — Encounter: Payer: Self-pay | Admitting: Radiology

## 2022-11-23 ENCOUNTER — Encounter: Payer: Self-pay | Admitting: *Deleted

## 2022-11-24 ENCOUNTER — Telehealth: Payer: Self-pay | Admitting: *Deleted

## 2022-11-24 NOTE — Telephone Encounter (Signed)
Completed Ajovy PA on CMM. Key: [id number]. Awaiting determination from [facility].

## 2022-11-24 NOTE — Telephone Encounter (Signed)
PA Case ID #: [id number] Need Help? Call us at [phone] Outcome Approved today Request Reference Number: [id number]. AJOVY INJ 225/1.5 is approved through 11/24/2023. Your patient may now fill this prescription and it will be covered.

## 2022-11-24 NOTE — Telephone Encounter (Signed)
PA completed in other encounter.

## 2022-11-24 NOTE — Telephone Encounter (Signed)
Spoke with pharmacy to get PBM information:  ID [id number] BIN [id number] PCN [id number] Group [id number]

## 2022-12-03 ENCOUNTER — Other Ambulatory Visit: Payer: Self-pay | Admitting: Orthopedic Surgery

## 2022-12-03 DIAGNOSIS — M7661 Achilles tendinitis, right leg: Secondary | ICD-10-CM

## 2022-12-10 DIAGNOSIS — G4733 Obstructive sleep apnea (adult) (pediatric): Secondary | ICD-10-CM | POA: Diagnosis not present

## 2023-01-02 ENCOUNTER — Ambulatory Visit: Payer: BC Managed Care – PPO | Admitting: Primary Care

## 2023-01-10 DIAGNOSIS — G4733 Obstructive sleep apnea (adult) (pediatric): Secondary | ICD-10-CM | POA: Diagnosis not present

## 2023-01-17 ENCOUNTER — Other Ambulatory Visit: Payer: Self-pay | Admitting: Adult Health

## 2023-01-18 NOTE — Telephone Encounter (Signed)
Requested Prescriptions   Pending Prescriptions Disp Refills   gabapentin (NEURONTIN) 100 MG capsule [Pharmacy Med Name: GABAPENTIN 100MG  CAPSULES] 180 capsule 1    Sig: TAKE 1 CAPSULE(100 MG) BY MOUTH TWICE DAILY   Last visit on 10/20/22 w/[facility] np, next visit scheduled on 10/26/23 Last note from millikan:Continues on Ajovy and gabapentin for migraines.  Refusing refill as it was just dispensed 01/06/23 Dispenses   Dispensed Days Supply Quantity Provider Pharmacy  GABAPENTIN  100 MG CAPS 01/06/2023 90 180 capsule [name], NP [facility]  GABAPENTIN 100MG  CAPSULES 09/27/2022 90 180 each [name], NP [facility]  GABAPENTIN 100MG  CAPSULES 06/28/2022 90 180 each [name], NP [facility]  GABAPENTIN 100MG  CAPSULES 03/25/2022 90 180 each [name], NP [facility].Marland KitchenMarland Kitchen

## 2023-02-14 ENCOUNTER — Other Ambulatory Visit: Payer: Self-pay | Admitting: Orthopedic Surgery

## 2023-02-14 DIAGNOSIS — M7661 Achilles tendinitis, right leg: Secondary | ICD-10-CM

## 2023-02-17 ENCOUNTER — Other Ambulatory Visit: Payer: Self-pay | Admitting: Adult Health

## 2023-02-22 NOTE — Telephone Encounter (Signed)
Pt last seen on 10/20/22 per note" Continue Ajovy " Follow up scheduled on 10/26/23

## 2023-02-25 ENCOUNTER — Ambulatory Visit
Admission: RE | Admit: 2023-02-25 | Discharge: 2023-02-25 | Disposition: A | Discharge status: Home / Self Care | Payer: BC Managed Care – PPO | Source: Ambulatory Visit | Attending: Nurse Practitioner | Admitting: Nurse Practitioner

## 2023-02-25 VITALS — BP 129/75 | HR 92 | Temp 98.1°F | Resp 17

## 2023-02-25 DIAGNOSIS — R21 Rash and other nonspecific skin eruption: Secondary | ICD-10-CM

## 2023-02-25 MED ORDER — CLOTRIMAZOLE-BETAMETHASONE 1-0.05 % EX CREA: TOPICAL_CREAM | CUTANEOUS | 0 refills | Status: DC

## 2023-02-25 NOTE — ED Provider Notes (Signed)
[facility]    CSN: [id number] Arrival date & time: 02/25/23  0843      History   Chief Complaint No chief complaint on file.   HPI Tonya Love is a 47 y.o. female.   The history is provided by the patient.   The patient presents with rash between the thighs open present for the past 3 weeks.  Patient states that she was recently treated for a genital herpes outbreak.  She thought that the outbreak could possibly spread down her legs as she had been sleeping negative.  She states that the area is "itchy" and that it has blisters on it.  She denies symptoms use of any new soaps, detergents, medications, lotions, or foods.    Past Medical History:  Diagnosis Date   Anxiety    Depression    GERD (gastroesophageal reflux disease)    Helicobacter pylori gastritis 10/2021   Treated with generic Prevpac, then treated with levofloxacin, amoxicillin, and PPI BID (July 2023)   Migraines    Pancreatitis    Sleep apnea     Patient Active Problem List   Diagnosis Date Noted   Chronic diarrhea 06/24/2022   H. pylori infection 03/18/2022   Helicobacter pylori gastritis 02/24/2022   Right tennis elbow 02/23/2022   Mild sleep apnea 12/13/2021   Obesity 12/13/2021   Left flank pain 09/03/2021   Early satiety 12/18/2019   Dysphagia 08/27/2019   Anemia 08/26/2019   Diarrhea 08/26/2019   Gastroesophageal reflux disease 08/26/2019   Nausea without vomiting 08/26/2019   History of diverticulitis 08/26/2019   Rectal bleeding 08/26/2019   Anxiety state, unspecified 02/11/2014    Past Surgical History:  Procedure Laterality Date   BIOPSY  11/06/2019   Procedure: BIOPSY;  Surgeon: Corbin Ade, MD;  [facility];  Service: Endoscopy;;   BIOPSY  11/12/2021   Procedure: BIOPSY;  Surgeon: Lanelle Bal, DO;  [facility];  Service: Endoscopy;;   CHOLECYSTECTOMY     COLONOSCOPY N/A 11/06/2019   Procedure: COLONOSCOPY;  Surgeon: Corbin Ade, diverticulosis in the sigmoid colon, minimal grade 2 hemorrhoids, otherwise normal.  Segmental biopsies benign.  Repeat colonoscopy in 10 years.   ESOPHAGOGASTRODUODENOSCOPY N/A 11/06/2019   Procedure: ESOPHAGOGASTRODUODENOSCOPY (EGD);  Surgeon: Corbin Ade, MD; mild Schatzki's ring s/p dilation, abnormal distal esophageal mucosa with benign biopsies, small hiatal hernia, otherwise normal.   ESOPHAGOGASTRODUODENOSCOPY (EGD) WITH PROPOFOL N/A 11/12/2021   Surgeon: Lanelle Bal, DO; small hiatal hernia, mild Schatzki's ring dilated, esophageal biopsies with reflux changes, H. pylori gastritis.  Treated with Prevpac.   MALONEY DILATION N/A 11/06/2019   Procedure: Elease Hashimoto DILATION;  Surgeon: Corbin Ade, MD;  [facility];  Service: Endoscopy;  Laterality: N/A;   TUBAL LIGATION      OB History     Gravida      Para      Term      Preterm      AB      Living  2      SAB      IAB      Ectopic      Multiple      Live Births               Home Medications    Prior to Admission medications   Medication Sig Start Date End Date Taking? Authorizing Provider  clotrimazole-betamethasone (LOTRISONE) cream Apply to affected area 2 times daily prn  02/25/23  Yes Dorsie Burich-Warren, [name], NP  cholestyramine (QUESTRAN) 4 g packet MIX 1 PACKET WITH 4 OUNCES OF WATER EVERY DAY WITH BREAKFAST, AND TAKE OTHER MEDS 4 TO 6 HOURS LATER 05/09/22   [name], PA-C  ferrous sulfate 325 (65 FE) MG tablet Take 325 mg by mouth daily with breakfast.    [provider]  fluticasone (FLONASE) 50 MCG/ACT nasal spray Place 2 sprays into both nostrils daily. 01/01/22   Wallis Bamberg, PA-C  Fremanezumab-vfrm (AJOVY) 225 MG/1.5ML SOAJ INJECT 1 PEN INTO THE SKIN EVERY 30 DAYS 02/22/23   [name], NP  gabapentin (NEURONTIN) 100 MG capsule TAKE 1 CAPSULE(100 MG) BY MOUTH TWICE DAILY 09/26/22   [name], NP  indomethacin (INDOCIN) 25 MG capsule TAKE 1 CAPSULE BY  MOUTH DAILY AT 12 PM 02/14/23   [name], MD  olopatadine (PATANOL) 0.1 % ophthalmic solution Place 1 drop into both eyes 2 (two) times daily. 01/01/22   Wallis Bamberg, PA-C  ondansetron (ZOFRAN) 4 MG tablet Take 1 tablet (4 mg total) by mouth every 8 (eight) hours as needed for nausea or vomiting. 06/24/22 06/24/23  [name], PA-C  pantoprazole (PROTONIX) 40 MG tablet Take 40 mg by mouth 2 (two) times daily.    [provider]  rizatriptan (MAXALT) 10 MG tablet Take 1 tablet at the onset of migraine. May repeat in 2 hours if needed 10/20/22   [name], NP  mirtazapine (REMERON) 30 MG tablet Take 1 tablet (30 mg total) by mouth at bedtime. 10/25/20 12/01/20  [name], MD    Family History Family History  Problem Relation Age of Onset   Hypertension Mother    Cancer Mother    Alcohol abuse Father    Alcohol abuse Paternal Grandfather    Depression Maternal Aunt    Depression Paternal Aunt    Colon cancer Neg Hx    Pancreatic cancer Neg Hx     Social History Social History   Tobacco Use   Smoking status: Never    Passive exposure: Past   Smokeless tobacco: Never  Vaping Use   Vaping Use: Never used  Substance Use Topics   Alcohol use: No   Drug use: No     Allergies   Codeine   Review of Systems Review of Systems Per HPI  Physical Exam Triage Vital Signs ED Triage Vitals  Enc Vitals Group     BP 02/25/23 0855 129/75     Pulse Rate 02/25/23 0855 92     Resp 02/25/23 0855 17     Temp 02/25/23 0855 98.1 F (36.7 C)     Temp Source 02/25/23 0855 Oral     SpO2 02/25/23 0855 98 %     Weight --      Height --      Head Circumference --      Peak Flow --      Pain Score 02/25/23 0858 0     Pain Loc --      Pain Edu? --      Excl. in GC? --    No data found.  Updated Vital Signs BP 129/75 (BP Location: Right Arm)   Pulse 92   Temp 98.1 F (36.7 C) (Oral)   Resp 17   LMP 02/08/2023 (Within Days)   SpO2 98%   Visual  Acuity Right Eye Distance:   Left Eye Distance:   Bilateral Distance:    Right Eye Near:   Left Eye  Near:    Bilateral Near:     Physical Exam Vitals and nursing note reviewed.  Constitutional:      General: She is not in acute distress.    Appearance: Normal appearance.  HENT:     Head: Normocephalic.  Eyes:     Extraocular Movements: Extraocular movements intact.     Pupils: Pupils are equal, round, and reactive to light.  Cardiovascular:     Rate and Rhythm: Normal rate and regular rhythm.     Pulses: Normal pulses.     Heart sounds: Normal heart sounds.  Pulmonary:     Effort: Pulmonary effort is normal.     Breath sounds: Normal breath sounds.  Abdominal:     General: Bowel sounds are normal.     Palpations: Abdomen is soft.     Tenderness: There is no abdominal tenderness.  Musculoskeletal:     Cervical back: Normal range of motion.  Skin:    General: Skin is warm and dry.     Findings: Rash present.     Comments: Erythematous rash noted to the bilateral upper thighs immediately under the vagina.  The area is flat, blistering present, there is no oozing, fluctuance, or drainage present.  Neurological:     General: No focal deficit present.     Mental Status: She is alert and oriented to person, place, and time.  Psychiatric:        Mood and Affect: Mood normal.        Behavior: Behavior normal.      UC Treatments / Results  Labs (all labs ordered are listed, but only abnormal results are displayed) Labs Reviewed - No data to display  EKG   Radiology No results found.  Procedures Procedures (including critical care time)  Medications Ordered in UC Medications - No data to display  Initial Impression / Assessment and Plan / UC Course  I have reviewed the triage vital signs and the nursing notes.  Pertinent labs & imaging results that were available during my care of the patient were reviewed by me and considered in my medical decision making (see  chart for details).  The patient is well-appearing, she is in no acute distress, vital signs are stable.  Symptoms appear to be caused by possible friction and increased moisture between her thighs.  Will treat empirically with Lotrisone cream.  Supportive care recommendations were provided and discussed with the patient to include covering the areas when she is at work to help reduce friction, cleaning the areas with antibacterial soap twice daily, and avoiding hot baths or showers.  Patient was also given indications of when follow-up may be necessary.  Patient is in agreement with this plan of care and verbalizes understanding.  All questions were answered.  Patient stable for discharge.   Final Clinical Impressions(s) / UC Diagnoses   Final diagnoses:  Rash and nonspecific skin eruption     Discharge Instructions      Take medication as prescribed. May also take over-the-counter Zyrtec to help with itching. Avoid hot baths or showers while symptoms persist.  Recommend taking lukewarm baths. May apply cool cloths to the area to help with itching or discomfort. Avoid scratching, rubbing, or manipulating the areas while symptoms persist. You may cover the areas when you are working to prevent worsening of symptoms. Make sure you are wearing close made of cotton to decrease moisture in those areas. Recommend Aveeno colloidal oatmeal bath to use to help with drying and itching.  Follow up if symptoms do not improve.      ED Prescriptions     Medication Sig Dispense Auth. Provider   clotrimazole-betamethasone (LOTRISONE) cream Apply to affected area 2 times daily prn 45 g Sanye Ledesma-Warren, [name], NP      PDMP not reviewed this encounter.   Abran Cantor, NP 02/25/23 [phone]

## 2023-02-25 NOTE — Discharge Instructions (Addendum)
Take medication as prescribed. May also take over-the-counter Zyrtec to help with itching. Avoid hot baths or showers while symptoms persist.  Recommend taking lukewarm baths. May apply cool cloths to the area to help with itching or discomfort. Avoid scratching, rubbing, or manipulating the areas while symptoms persist. You may cover the areas when you are working to prevent worsening of symptoms. Make sure you are wearing close made of cotton to decrease moisture in those areas. Recommend Aveeno colloidal oatmeal bath to use to help with drying and itching. Follow up if symptoms do not improve.

## 2023-02-25 NOTE — ED Triage Notes (Signed)
Pt c/o rash between the thighs x 3 weeks, pt states she has genital herpes, had and outbreak and read that sleeping naked and letting the affected area helps with the outbreak caused it to spread down her thighs.

## 2023-03-02 ENCOUNTER — Encounter: Payer: Self-pay | Admitting: Gastroenterology

## 2023-03-03 ENCOUNTER — Ambulatory Visit
Admission: RE | Admit: 2023-03-03 | Discharge: 2023-03-03 | Disposition: A | Discharge status: Home / Self Care | Payer: BC Managed Care – PPO | Source: Ambulatory Visit | Attending: Urgent Care | Admitting: Urgent Care

## 2023-03-03 VITALS — BP 124/83 | HR 101 | Temp 98.4°F | Resp 18

## 2023-03-03 DIAGNOSIS — K047 Periapical abscess without sinus: Secondary | ICD-10-CM | POA: Diagnosis not present

## 2023-03-03 MED ORDER — NAPROXEN 500 MG PO TABS: 500.0000 mg | ORAL_TABLET | Freq: Two times a day (BID) | ORAL | 0 refills | Status: DC

## 2023-03-03 MED ORDER — CEFTRIAXONE SODIUM 1 G IJ SOLR
1.0000 g | Freq: Once | INTRAMUSCULAR | Status: AC
  Administered 2023-03-03: 1 g via INTRAMUSCULAR

## 2023-03-03 MED ORDER — CLINDAMYCIN HCL 300 MG PO CAPS
300.0000 mg | ORAL_CAPSULE | Freq: Three times a day (TID) | ORAL | 0 refills | Status: DC
Start: 2023-03-03 — End: 2023-10-05

## 2023-03-03 NOTE — ED Triage Notes (Signed)
Dental pain on right lower side.  Has seen dentist on Monday and was given amoxicillin and Ibuprofen.  States pain is getting worse.  States it hurts to touch face now.  Marland Kitchen

## 2023-03-03 NOTE — ED Provider Notes (Signed)
[facility]  Note:  This document was prepared using Conservation officer, historic buildings and may include unintentional dictation errors.  MRN: [id number] DOB: 1976-09-17  Subjective:   Tonya Love is a 47 y.o. female presenting for 1 week history of persistent and worsening moderate to severe right-sided dental pain, facial pain, oral pain.  Patient was seen by her dentist on Monday and started on amoxicillin and ibuprofen.  Feels like she is getting worse.  No fever, chest pain, drainage of pus or bleeding, nausea, vomiting.  No current facility-administered medications for this encounter.  Current Outpatient Medications:    cholestyramine (QUESTRAN) 4 g packet, MIX 1 PACKET WITH 4 OUNCES OF WATER EVERY DAY WITH BREAKFAST, AND TAKE OTHER MEDS 4 TO 6 HOURS LATER, Disp: 90 each, Rfl: 1   clotrimazole-betamethasone (LOTRISONE) cream, Apply to affected area 2 times daily prn, Disp: 45 g, Rfl: 0   ferrous sulfate 325 (65 FE) MG tablet, Take 325 mg by mouth daily with breakfast., Disp: , Rfl:    fluticasone (FLONASE) 50 MCG/ACT nasal spray, Place 2 sprays into both nostrils daily., Disp: 16 g, Rfl: 12   Fremanezumab-vfrm (AJOVY) 225 MG/1.5ML SOAJ, INJECT 1 PEN INTO THE SKIN EVERY 30 DAYS, Disp: 1.5 mL, Rfl: 1   gabapentin (NEURONTIN) 100 MG capsule, TAKE 1 CAPSULE(100 MG) BY MOUTH TWICE DAILY, Disp: 180 capsule, Rfl: 1   indomethacin (INDOCIN) 25 MG capsule, TAKE 1 CAPSULE BY MOUTH DAILY AT 12 PM, Disp: 42 capsule, Rfl: 0   olopatadine (PATANOL) 0.1 % ophthalmic solution, Place 1 drop into both eyes 2 (two) times daily., Disp: 5 mL, Rfl: 12   ondansetron (ZOFRAN) 4 MG tablet, Take 1 tablet (4 mg total) by mouth every 8 (eight) hours as needed for nausea or vomiting., Disp: 30 tablet, Rfl: 1   pantoprazole (PROTONIX) 40 MG tablet, Take 40 mg by mouth 2 (two) times daily., Disp: , Rfl:    rizatriptan (MAXALT) 10 MG tablet, Take 1 tablet at the onset of migraine. May  repeat in 2 hours if needed, Disp: 10 tablet, Rfl: 11   Allergies  Allergen Reactions   Codeine Nausea Only    Past Medical History:  Diagnosis Date   Anxiety    Depression    GERD (gastroesophageal reflux disease)    Helicobacter pylori gastritis 10/2021   Treated with generic Prevpac, then treated with levofloxacin, amoxicillin, and PPI BID (July 2023)   Migraines    Pancreatitis    Sleep apnea      Past Surgical History:  Procedure Laterality Date   BIOPSY  11/06/2019   Procedure: BIOPSY;  Surgeon: Corbin Ade, MD;  [facility];  Service: Endoscopy;;   BIOPSY  11/12/2021   Procedure: BIOPSY;  Surgeon: Lanelle Bal, DO;  [facility]: [facility];  Service: Endoscopy;;   CHOLECYSTECTOMY     COLONOSCOPY N/A 11/06/2019   Procedure: COLONOSCOPY;  Surgeon: Corbin Ade, diverticulosis in the sigmoid colon, minimal grade 2 hemorrhoids, otherwise normal.  Segmental biopsies benign.  Repeat colonoscopy in 10 years.   ESOPHAGOGASTRODUODENOSCOPY N/A 11/06/2019   Procedure: ESOPHAGOGASTRODUODENOSCOPY (EGD);  Surgeon: Corbin Ade, MD; mild Schatzki's ring s/p dilation, abnormal distal esophageal mucosa with benign biopsies, small hiatal hernia, otherwise normal.   ESOPHAGOGASTRODUODENOSCOPY (EGD) WITH PROPOFOL N/A 11/12/2021   Surgeon: Lanelle Bal, DO; small hiatal hernia, mild Schatzki's ring dilated, esophageal biopsies with reflux changes, H. pylori gastritis.  Treated with Prevpac.   MALONEY DILATION  N/A 11/06/2019   Procedure: Elease Hashimoto DILATION;  Surgeon: Corbin Ade, MD;  [facility]: [facility];  Service: Endoscopy;  Laterality: N/A;   TUBAL LIGATION      Family History  Problem Relation Age of Onset   Hypertension Mother    Cancer Mother    Alcohol abuse Father    Alcohol abuse Paternal Grandfather    Depression Maternal Aunt    Depression Paternal Aunt    Colon cancer Neg Hx    Pancreatic cancer Neg Hx     Social History   Tobacco  Use   Smoking status: Never    Passive exposure: Past   Smokeless tobacco: Never  Vaping Use   Vaping Use: Never used  Substance Use Topics   Alcohol use: No   Drug use: No    ROS   Objective:   Vitals: BP 124/83 (BP Location: Right Arm)   Pulse (!) 101   Temp 98.4 F (36.9 C) (Oral)   Resp 18   LMP 02/08/2023 (Within Days)   SpO2 98%   Physical Exam Constitutional:      General: She is not in acute distress.    Appearance: Normal appearance. She is well-developed. She is not ill-appearing, toxic-appearing or diaphoretic.  HENT:     Head: Normocephalic and atraumatic.      Nose: Nose normal.     Mouth/Throat:     Mouth: Mucous membranes are moist.      Comments: No appreciable abscess. Eyes:     General: No scleral icterus.       Right eye: No discharge.        Left eye: No discharge.     Extraocular Movements: Extraocular movements intact.  Cardiovascular:     Rate and Rhythm: Normal rate.  Pulmonary:     Effort: Pulmonary effort is normal.  Skin:    General: Skin is warm and dry.  Neurological:     General: No focal deficit present.     Mental Status: She is alert and oriented to person, place, and time.  Psychiatric:        Mood and Affect: Mood normal.        Behavior: Behavior normal.    IM ceftriaxone 1 g administered in clinic.  Assessment and Plan :   PDMP not reviewed this encounter.  1. Dental infection    Offered to trial 2 different types of antibiotics and ceftriaxone as above and also clindamycin.  Will have her switch to naproxen from ibuprofen.  Maintain ER precautions and otherwise follow-up very closely with her dental specialist.  Counseled patient on potential for adverse effects with medications prescribed/recommended today, ER and return-to-clinic precautions discussed, patient verbalized understanding.    Wallis Bamberg, [place] 03/03/23 [phone]

## 2023-04-03 ENCOUNTER — Ambulatory Visit: Payer: BC Managed Care – PPO | Admitting: Adult Health

## 2023-04-05 NOTE — Progress Notes (Signed)
Referring Provider: No ref. provider found Primary Care Physician:  Pcp, No Primary GI Physician: Dr. Jena Gauss  Chief Complaint  Patient presents with   Follow-up    Follow up    HPI:   Tonya Love is a 47 y.o. female presenting today for follow-up.  GI history of  GERD, dysphagia secondary to Schatzki's ring s/p dilations, early satiety and nausea with vomiting starting early 2023, found to have H pylori Feb 2023 treated with 2 different regimens with documented eradication November 2023. GES was normal. 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     Last seen via virtual visit 09/21/22. GERD well controlled on PPI BID. Reported nausea was better. Only occurring a couple times per week about once per day. Has good relief from zofran. No vomiting. Appetite is good at times and sometimes still needs to make herself eat and still has some early satiety (possibly some mild improvement). Queried whether nausea had been related to multiple viral illnesses occurring back-to-back. Recommended continue PPI BID, 4-6 small meals daily. Zofran prn, cholestyramine 4g daily.   Today:  GERD: Well-controlled on pantoprazole 40 mg twice daily.  Diarrhea: Reports having urgent loose stool after her lunch meal.  She does find in the morning and evening with no bowel movement.  She takes Questran in the morning before breakfast.  She tends to eat a small breakfast, something like a cereal bar.  At lunch, she eats a larger meal.  No BRBPR, melena, abdominal pain.  Mild intermittent nausea once every couple weeks, responds well to Zofran.  No vomiting.  Weight is stable.    Prior GI Work-up:  Colonoscopy Feb 2021 with diverticulosis in sigmoid colon,  minimal grade 2 hemorrhoids, and benign random colon biopsies with recommendations to repeat in 10 years.    EGD Feb 2021 with mild Schatzki ring s/p dilation, abnormal distal esophageal mucosa with benign biopsy, small hiatal hernia, otherwise normal exam.   GES wnl in April 2021.    Celiac serologies negative.  CT A/P with contrast 05/08/2020 with no significant findings.     CT A/P with contrast completed 09/28/2021 with no acute findings.  EGD 11/12/2021 revealing Schatzki's ring dilated, small hiatal hernia, erythematous mucosa in the gastric antrum. Esophageal biopsies revealed reactive squamous mucosa with increased intraepithelial lymphocytes and rare eosinophils. Gastric biopsy with chronic gastritis with H. pylori.   Gastric emptying study 06/30/2022: Normal.  H pylori breath test negative November 2023.   Past Medical History:  Diagnosis Date   Anxiety    Depression    GERD (gastroesophageal reflux disease)    Helicobacter pylori gastritis 10/2021   Treated with generic Prevpac, then treated with levofloxacin, amoxicillin, and PPI BID (July 2023)   Migraines    Pancreatitis    Sleep apnea     Past Surgical History:  Procedure Laterality Date   BIOPSY  11/06/2019   Procedure: BIOPSY;  Surgeon: Corbin Ade, MD;  [facility];  Service: Endoscopy;;   BIOPSY  11/12/2021   Procedure: BIOPSY;  Surgeon: Lanelle Bal, DO;  [facility];  Service: Endoscopy;;   CHOLECYSTECTOMY     COLONOSCOPY N/A 11/06/2019   Procedure: COLONOSCOPY;  Surgeon: Corbin Ade, diverticulosis in the sigmoid colon, minimal grade 2 hemorrhoids, otherwise  normal.  Segmental biopsies benign.  Repeat colonoscopy in 10 years.   ESOPHAGOGASTRODUODENOSCOPY N/A 11/06/2019   Procedure: ESOPHAGOGASTRODUODENOSCOPY (EGD);  Surgeon: Corbin Ade, MD; mild Schatzki's ring s/p dilation, abnormal distal esophageal mucosa with benign biopsies, small hiatal hernia, otherwise  normal.   ESOPHAGOGASTRODUODENOSCOPY (EGD) WITH PROPOFOL N/A 11/12/2021   Surgeon: Lanelle Bal, DO; small hiatal hernia, mild Schatzki's ring dilated, esophageal biopsies with reflux changes, H. pylori gastritis.  Treated with Prevpac.   MALONEY DILATION N/A 11/06/2019   Procedure: Elease Hashimoto DILATION;  Surgeon: Corbin Ade, MD;  [facility];  Service: Endoscopy;  Laterality: N/A;   TUBAL LIGATION      Current Outpatient Medications  Medication Sig Dispense Refill   clindamycin (CLEOCIN) 300 MG capsule Take 1 capsule (300 mg total) by mouth 3 (three) times daily. 30 capsule 0   clotrimazole-betamethasone (LOTRISONE) cream Apply to affected area 2 times daily prn 45 g 0   ferrous sulfate 325 (65 FE) MG tablet Take 325 mg by mouth daily with breakfast.     Fremanezumab-vfrm (AJOVY) 225 MG/1.5ML SOAJ INJECT 1 PEN INTO THE SKIN EVERY 30 DAYS 1.5 mL 1   gabapentin (NEURONTIN) 100 MG capsule TAKE 1 CAPSULE(100 MG) BY MOUTH TWICE DAILY 180 capsule 1   indomethacin (INDOCIN) 25 MG capsule TAKE 1 CAPSULE BY MOUTH DAILY AT 12 PM 42 capsule 0   ondansetron (ZOFRAN) 4 MG tablet Take 1 tablet (4 mg total) by mouth every 8 (eight) hours as needed for nausea or vomiting. 30 tablet 1   pantoprazole (PROTONIX) 40 MG tablet Take 40 mg by mouth 2 (two) times daily.     rizatriptan (MAXALT) 10 MG tablet Take 1 tablet at the onset of migraine. May repeat in 2 hours if needed 10 tablet 11   cholestyramine (QUESTRAN) 4 g packet Take 1 packet (4 g total) by mouth daily before lunch. 90 each 1   No current facility-administered medications for this visit.    Allergies as of 04/06/2023 - Review Complete 04/06/2023  Allergen Reaction Noted   Codeine Nausea Only 12/25/2014    Family History  Problem Relation Age of Onset   Hypertension Mother    Cancer Mother    Alcohol abuse Father    Alcohol abuse Paternal Grandfather    Depression Maternal Aunt    Depression Paternal Aunt    Colon  cancer Neg Hx    Pancreatic cancer Neg Hx     Social History   Socioeconomic History   Marital status: Legally Separated    Spouse name: Not on file   Number of children: Not on file   Years of education: Not on file   Highest education level: Not on file  Occupational History   Not on file  Tobacco Use   Smoking status: Never    Passive exposure: Past   Smokeless tobacco: Never  Vaping Use   Vaping status: Never Used  Substance and Sexual Activity   Alcohol use: No   Drug use: No   Sexual activity: Not Currently  Other Topics Concern   Not on file  Social History Narrative   Lives alone    Right handed   Caffeine: 2 sodas/day   Social Determinants of Health   Financial Resource Strain: Low Risk  (06/26/2018)   Received from [facility], [facility]   Overall Financial Resource Strain (CARDIA)    Difficulty of Paying Living Expenses: Not hard at all  Food Insecurity: No Food  Insecurity (06/26/2018)   Received from [facility], [facility]   Hunger Vital Sign    Worried About Running Out of Food in the Last Year: Never true    Ran Out of Food in the Last Year: Never true  Transportation Needs: No Transportation Needs (06/26/2018)   Received from [facility], [facility]   [facility] - Transportation    Lack of Transportation (Medical): No    Lack of Transportation (Non-Medical): No  Physical Activity: Inactive (06/26/2018)   Received from [facility], [facility]   Exercise Vital Sign    Days of Exercise per Week: 0 days    Minutes of Exercise per Session: 0 min  Stress: Not on file  Social Connections: Not on file    Review of Systems: Gen: Denies fever, chills, cold or flulike symptoms, presyncope, syncope. CV: Denies chest pain, palpitations. Resp: Denies dyspnea, cough. GI: See HPI Heme: See HPI  Physical Exam: BP 117/80 (BP Location: Right Arm, Patient Position: Sitting, Cuff Size: Normal)   Pulse 79   Temp 97.9 F (36.6  C) (Temporal)   Ht 5\' 2"  (1.575 m)   Wt 186 lb (84.4 kg)   LMP 03/14/2023 (Approximate)   SpO2 98%   BMI 34.02 kg/m  General:   Alert and oriented. No distress noted. Pleasant and cooperative.  Head:  Normocephalic and atraumatic. Eyes:  Conjuctiva clear without scleral icterus. Heart:  S1, S2 present without murmurs appreciated. Lungs:  Clear to auscultation bilaterally. No wheezes, rales, or rhonchi. No distress.  Abdomen:  +BS, soft, non-tender and non-distended. No rebound or guarding. No HSM or masses noted. Msk:  Symmetrical without gross deformities. Normal posture. Extremities:  Without edema. Neurologic:  Alert and  oriented x4 Psych:  Normal mood and affect.    Assessment:  47 year old female with history of GERD, dysphagia with Schatzki's ring s/p dilations, chronic mild early satiety and nausea, H. pylori February 2023 with documented eradication November 2023, diverticulitis, chronic diarrhea, toilet tissue hematochezia in setting of hemorrhoids, mild anemia suspected to be due to menorrhagia, presenting today for follow-up.  GERD: Well-controlled on pantoprazole 40 mg twice daily.  No alarm symptoms.  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 Currently taking Questran 4 g in the morning, but reports having loose/urgent bowel movement after her lunch meal.  No BMs after breakfast or dinner.  No alarm symptoms.  Colonoscopy up-to-date in February 2021.  Recommended she try taking Questran before her lunch meal rather than before breakfast as she tends to eat a light breakfast.  If she continues to have loose stools, will increase Questran to twice daily.  Chronic nausea: Extensive workup previously detailed in HPI.  She was found to have H. pylori which was treated and eradicated with confirmation in November 2023.  She had improvement in nausea thereafter, reporting her symptoms are mild and only occurring once every couple  weeks.  Symptoms respond well to Zofran.  No further workup needed at this time.  Plan:  Continue pantoprazole 40 mg twice daily. Start taking Questran before lunch meal rather than breakfast.  Patient will let me know if she continues to have loose stools and we will increase Questran to twice daily. Continue Zofran as needed. Follow-up in 6 months or sooner if needed.   Ermalinda Memos, PA-C [facility] 04/06/2023

## 2023-04-06 ENCOUNTER — Encounter: Payer: Self-pay | Admitting: Gastroenterology

## 2023-04-06 ENCOUNTER — Ambulatory Visit (INDEPENDENT_AMBULATORY_CARE_PROVIDER_SITE_OTHER): Payer: BC Managed Care – PPO | Admitting: Gastroenterology

## 2023-04-06 VITALS — BP 117/80 | HR 79 | Temp 97.9°F | Ht 62.0 in | Wt 186.0 lb

## 2023-04-06 DIAGNOSIS — R197 Diarrhea, unspecified: Secondary | ICD-10-CM

## 2023-04-06 DIAGNOSIS — R110 Nausea: Secondary | ICD-10-CM | POA: Diagnosis not present

## 2023-04-06 DIAGNOSIS — K219 Gastro-esophageal reflux disease without esophagitis: Secondary | ICD-10-CM | POA: Diagnosis not present

## 2023-04-06 MED ORDER — CHOLESTYRAMINE 4 G PO PACK: 4.0000 g | PACK | Freq: Every day | ORAL | 1 refills | Status: DC

## 2023-04-06 NOTE — Patient Instructions (Signed)
Continue taking pantoprazole 40 mg twice daily 30 minutes before breakfast and dinner.  Try taking Questran with lunch rather than with breakfast to see if this will prevent the loose stools after lunchtime.  If you continue to have trouble with loose stools, please let me know and we can increase Questran.  Continue to use Zofran as needed for nausea.  Will plan to follow-up with you in 6 months or sooner if needed.  It was great to see you again today!  Ermalinda Memos, PA-C [facility]

## 2023-04-10 ENCOUNTER — Ambulatory Visit
Admission: RE | Admit: 2023-04-10 | Discharge: 2023-04-10 | Disposition: A | Discharge status: Home / Self Care | Payer: BC Managed Care – PPO | Source: Ambulatory Visit | Attending: Family Medicine | Admitting: Family Medicine

## 2023-04-10 VITALS — BP 107/63 | HR 63 | Temp 98.5°F | Resp 18

## 2023-04-10 DIAGNOSIS — M5432 Sciatica, left side: Secondary | ICD-10-CM

## 2023-04-10 MED ORDER — TIZANIDINE HCL 4 MG PO CAPS: 4.0000 mg | ORAL_CAPSULE | Freq: Three times a day (TID) | ORAL | 0 refills | Status: DC | PRN

## 2023-04-10 MED ORDER — DEXAMETHASONE SODIUM PHOSPHATE 10 MG/ML IJ SOLN
10.0000 mg | Freq: Once | INTRAMUSCULAR | Status: AC
Start: 2023-04-10 — End: 2023-04-10
  Administered 2023-04-10: 10 mg via INTRAMUSCULAR

## 2023-04-10 NOTE — ED Triage Notes (Signed)
Pt reports she has lower back pain that shoots down her leg x 2 days.

## 2023-04-10 NOTE — ED Provider Notes (Signed)
[facility]    CSN: [id number] Arrival date & time: 04/10/23  1456      History   Chief Complaint Chief Complaint  Patient presents with   Back Pain    Hurt my back yesterday - Entered by patient    HPI Tonya Love is a 47 y.o. female.   Patient presenting today with 2-day history of left-sided lower back pain radiating down the posterior left leg after picking something up that was heavy.  Denies bowel or bladder incontinence, fever, saddle anesthesias, urinary symptoms, weakness numbness or tingling of the leg.  So far trying over-the-counter anti-inflammatories with minimal relief.    Past Medical History:  Diagnosis Date   Anxiety    Depression    GERD (gastroesophageal reflux disease)    Helicobacter pylori gastritis 10/2021   Treated with generic Prevpac, then treated with levofloxacin, amoxicillin, and PPI BID (July 2023)   Migraines    Pancreatitis    Sleep apnea     Patient Active Problem List   Diagnosis Date Noted   Chronic diarrhea 06/24/2022   H. pylori infection 03/18/2022   Helicobacter pylori gastritis 02/24/2022   Right tennis elbow 02/23/2022   Mild sleep apnea 12/13/2021   Obesity 12/13/2021   Left flank pain 09/03/2021   Early satiety 12/18/2019   Dysphagia 08/27/2019   Anemia 08/26/2019   Diarrhea 08/26/2019   Gastroesophageal reflux disease 08/26/2019   Nausea without vomiting 08/26/2019   History of diverticulitis 08/26/2019   Rectal bleeding 08/26/2019   Anxiety state, unspecified 02/11/2014    Past Surgical History:  Procedure Laterality Date   BIOPSY  11/06/2019   Procedure: BIOPSY;  Surgeon: Corbin Ade, MD;  [facility];  Service: Endoscopy;;   BIOPSY  11/12/2021   Procedure: BIOPSY;  Surgeon: Lanelle Bal, DO;  [facility];  Service: Endoscopy;;   CHOLECYSTECTOMY     COLONOSCOPY N/A 11/06/2019   Procedure: COLONOSCOPY;  Surgeon: Corbin Ade, diverticulosis in the sigmoid  colon, minimal grade 2 hemorrhoids, otherwise normal.  Segmental biopsies benign.  Repeat colonoscopy in 10 years.   ESOPHAGOGASTRODUODENOSCOPY N/A 11/06/2019   Procedure: ESOPHAGOGASTRODUODENOSCOPY (EGD);  Surgeon: Corbin Ade, MD; mild Schatzki's ring s/p dilation, abnormal distal esophageal mucosa with benign biopsies, small hiatal hernia, otherwise normal.   ESOPHAGOGASTRODUODENOSCOPY (EGD) WITH PROPOFOL N/A 11/12/2021   Surgeon: Lanelle Bal, DO; small hiatal hernia, mild Schatzki's ring dilated, esophageal biopsies with reflux changes, H. pylori gastritis.  Treated with Prevpac.   MALONEY DILATION N/A 11/06/2019   Procedure: Elease Hashimoto DILATION;  Surgeon: Corbin Ade, MD;  [facility];  Service: Endoscopy;  Laterality: N/A;   TUBAL LIGATION      OB History     Gravida      Para      Term      Preterm      AB      Living  2      SAB      IAB      Ectopic      Multiple      Live Births               Home Medications    Prior to Admission medications   Medication Sig Start Date End Date Taking? Authorizing Provider  tiZANidine (ZANAFLEX) 4 MG capsule Take 1 capsule (4 mg total) by mouth 3 (three) times daily as needed for muscle spasms. Do not drink alcohol or  drive while taking this medication.  May cause drowsiness. 04/10/23  Yes Particia Nearing, PA-C  cholestyramine Lanetta Inch) 4 g packet Take 1 packet (4 g total) by mouth daily before lunch. 04/06/23   [name], PA-C  clindamycin (CLEOCIN) 300 MG capsule Take 1 capsule (300 mg total) by mouth 3 (three) times daily. 03/03/23   Wallis Bamberg, PA-C  clotrimazole-betamethasone (LOTRISONE) cream Apply to affected area 2 times daily prn 02/25/23   [name], [name], NP  ferrous sulfate 325 (65 FE) MG tablet Take 325 mg by mouth daily with breakfast.    [provider]  Fremanezumab-vfrm (AJOVY) 225 MG/1.5ML SOAJ INJECT 1 PEN INTO THE SKIN EVERY 30 DAYS 02/22/23    [name], NP  gabapentin (NEURONTIN) 100 MG capsule TAKE 1 CAPSULE(100 MG) BY MOUTH TWICE DAILY 09/26/22   [name], NP  indomethacin (INDOCIN) 25 MG capsule TAKE 1 CAPSULE BY MOUTH DAILY AT 12 PM 02/14/23   [name], MD  ondansetron (ZOFRAN) 4 MG tablet Take 1 tablet (4 mg total) by mouth every 8 (eight) hours as needed for nausea or vomiting. 06/24/22 06/24/23  [name], PA-C  pantoprazole (PROTONIX) 40 MG tablet Take 40 mg by mouth 2 (two) times daily.    [provider]  rizatriptan (MAXALT) 10 MG tablet Take 1 tablet at the onset of migraine. May repeat in 2 hours if needed 10/20/22   [name], NP  mirtazapine (REMERON) 30 MG tablet Take 1 tablet (30 mg total) by mouth at bedtime. 10/25/20 12/01/20  [name], MD    Family History Family History  Problem Relation Age of Onset   Hypertension Mother    Cancer Mother    Alcohol abuse Father    Alcohol abuse Paternal Grandfather    Depression Maternal Aunt    Depression Paternal Aunt    Colon cancer Neg Hx    Pancreatic cancer Neg Hx     Social History Social History   Tobacco Use   Smoking status: Never    Passive exposure: Past   Smokeless tobacco: Never  Vaping Use   Vaping status: Never Used  Substance Use Topics   Alcohol use: No   Drug use: No     Allergies   Codeine   Review of Systems Review of Systems Per HPI  Physical Exam Triage Vital Signs ED Triage Vitals  Encounter Vitals Group     BP 04/10/23 1513 107/63     Systolic BP Percentile --      Diastolic BP Percentile --      Pulse Rate 04/10/23 1513 63     Resp 04/10/23 1513 18     Temp 04/10/23 1513 98.5 F (36.9 C)     Temp Source 04/10/23 1513 Oral     SpO2 04/10/23 1513 97 %     Weight --      Height --      Head Circumference --      Peak Flow --      Pain Score 04/10/23 1515 7     Pain Loc --      Pain Education --      Exclude from Growth Chart --    No data found.  Updated Vital  Signs BP 107/63 (BP Location: Right Arm)   Pulse 63   Temp 98.5 F (36.9 C) (Oral)   Resp 18   LMP 03/14/2023 (Approximate)   SpO2 97%   Visual Acuity Right Eye Distance:  Left Eye Distance:   Bilateral Distance:    Right Eye Near:   Left Eye Near:    Bilateral Near:     Physical Exam Vitals and nursing note reviewed.  Constitutional:      Appearance: Normal appearance. She is not ill-appearing.  HENT:     Head: Atraumatic.  Eyes:     Extraocular Movements: Extraocular movements intact.     Conjunctiva/sclera: Conjunctivae normal.  Cardiovascular:     Rate and Rhythm: Normal rate and regular rhythm.     Heart sounds: Normal heart sounds.  Pulmonary:     Effort: Pulmonary effort is normal.     Breath sounds: Normal breath sounds.  Musculoskeletal:        General: Normal range of motion.     Cervical back: Normal range of motion and neck supple.     Comments: No midline spinal tenderness to palpation diffusely.  Left lumbar paraspinal muscles tender to palpation.  Negative straight leg raise bilaterally  Skin:    General: Skin is warm and dry.     Findings: No bruising or erythema.  Neurological:     Mental Status: She is alert and oriented to person, place, and time.     Motor: No weakness.     Gait: Gait normal.     Comments: Bilateral lower extremities neurovascularly intact  Psychiatric:        Mood and Affect: Mood normal.        Thought Content: Thought content normal.        Judgment: Judgment normal.      UC Treatments / Results  Labs (all labs ordered are listed, but only abnormal results are displayed) Labs Reviewed - No data to display  EKG   Radiology No results found.  Procedures Procedures (including critical care time)  Medications Ordered in UC Medications  dexamethasone (DECADRON) injection 10 mg (has no administration in time range)    Initial Impression / Assessment and Plan / UC Course  I have reviewed the triage vital signs  and the nursing notes.  Pertinent labs & imaging results that were available during my care of the patient were reviewed by me and considered in my medical decision making (see chart for details).     Treat with IM Decadron, Zanaflex, heat, soft, stretches, rest.  Work note given.  Return for worsening symptoms.  Final Clinical Impressions(s) / UC Diagnoses   Final diagnoses:  Left sided sciatica   Discharge Instructions   None    ED Prescriptions     Medication Sig Dispense Auth. Provider   tiZANidine (ZANAFLEX) 4 MG capsule Take 1 capsule (4 mg total) by mouth 3 (three) times daily as needed for muscle spasms. Do not drink alcohol or drive while taking this medication.  May cause drowsiness. 15 capsule Particia Nearing, [place]      PDMP not reviewed this encounter.   Particia Nearing, [place] 04/10/23 1540

## 2023-04-11 ENCOUNTER — Telehealth: Payer: Self-pay | Admitting: Emergency Medicine

## 2023-04-11 NOTE — Telephone Encounter (Signed)
Pt called and inquired about zanaflex prescription and states "I took it last night and my legs kept jumping." Pt reported "is it ok to keep taking? Is this an allergy?." Consulted NP and reported not considered an allergy, reported pt could still take medication. Pt verbalized understanding.

## 2023-04-27 DIAGNOSIS — N951 Menopausal and female climacteric states: Secondary | ICD-10-CM | POA: Diagnosis not present

## 2023-04-27 DIAGNOSIS — D509 Iron deficiency anemia, unspecified: Secondary | ICD-10-CM | POA: Diagnosis not present

## 2023-04-27 DIAGNOSIS — A6004 Herpesviral vulvovaginitis: Secondary | ICD-10-CM | POA: Diagnosis not present

## 2023-05-19 DIAGNOSIS — S8991XA Unspecified injury of right lower leg, initial encounter: Secondary | ICD-10-CM | POA: Diagnosis not present

## 2023-05-19 DIAGNOSIS — M79604 Pain in right leg: Secondary | ICD-10-CM | POA: Diagnosis not present

## 2023-05-20 DIAGNOSIS — M19071 Primary osteoarthritis, right ankle and foot: Secondary | ICD-10-CM | POA: Diagnosis not present

## 2023-05-20 DIAGNOSIS — K219 Gastro-esophageal reflux disease without esophagitis: Secondary | ICD-10-CM | POA: Diagnosis not present

## 2023-05-20 DIAGNOSIS — Z9049 Acquired absence of other specified parts of digestive tract: Secondary | ICD-10-CM | POA: Diagnosis not present

## 2023-05-20 DIAGNOSIS — M25561 Pain in right knee: Secondary | ICD-10-CM | POA: Diagnosis not present

## 2023-05-20 DIAGNOSIS — G8911 Acute pain due to trauma: Secondary | ICD-10-CM | POA: Diagnosis not present

## 2023-05-20 DIAGNOSIS — M25571 Pain in right ankle and joints of right foot: Secondary | ICD-10-CM | POA: Diagnosis not present

## 2023-05-20 DIAGNOSIS — Z885 Allergy status to narcotic agent status: Secondary | ICD-10-CM | POA: Diagnosis not present

## 2023-05-20 DIAGNOSIS — M5137 Other intervertebral disc degeneration, lumbosacral region: Secondary | ICD-10-CM | POA: Diagnosis not present

## 2023-05-20 DIAGNOSIS — M5136 Other intervertebral disc degeneration, lumbar region: Secondary | ICD-10-CM | POA: Diagnosis not present

## 2023-05-20 DIAGNOSIS — R200 Anesthesia of skin: Secondary | ICD-10-CM | POA: Diagnosis not present

## 2023-05-20 DIAGNOSIS — G43909 Migraine, unspecified, not intractable, without status migrainosus: Secondary | ICD-10-CM | POA: Diagnosis not present

## 2023-05-20 DIAGNOSIS — M5126 Other intervertebral disc displacement, lumbar region: Secondary | ICD-10-CM | POA: Diagnosis not present

## 2023-05-20 DIAGNOSIS — M5127 Other intervertebral disc displacement, lumbosacral region: Secondary | ICD-10-CM | POA: Diagnosis not present

## 2023-05-20 DIAGNOSIS — M7751 Other enthesopathy of right foot: Secondary | ICD-10-CM | POA: Diagnosis not present

## 2023-05-20 DIAGNOSIS — M48061 Spinal stenosis, lumbar region without neurogenic claudication: Secondary | ICD-10-CM | POA: Diagnosis not present

## 2023-05-20 DIAGNOSIS — Z79899 Other long term (current) drug therapy: Secondary | ICD-10-CM | POA: Diagnosis not present

## 2023-05-20 DIAGNOSIS — Z87891 Personal history of nicotine dependence: Secondary | ICD-10-CM | POA: Diagnosis not present

## 2023-06-13 DIAGNOSIS — M5137 Other intervertebral disc degeneration, lumbosacral region: Secondary | ICD-10-CM | POA: Diagnosis not present

## 2023-06-27 DIAGNOSIS — Z6833 Body mass index (BMI) 33.0-33.9, adult: Secondary | ICD-10-CM | POA: Diagnosis not present

## 2023-06-27 DIAGNOSIS — Z01419 Encounter for gynecological examination (general) (routine) without abnormal findings: Secondary | ICD-10-CM | POA: Diagnosis not present

## 2023-06-28 DIAGNOSIS — Z0189 Encounter for other specified special examinations: Secondary | ICD-10-CM | POA: Diagnosis not present

## 2023-06-29 ENCOUNTER — Encounter: Payer: Self-pay | Admitting: Cardiology

## 2023-06-29 ENCOUNTER — Ambulatory Visit (INDEPENDENT_AMBULATORY_CARE_PROVIDER_SITE_OTHER): Payer: BC Managed Care – PPO

## 2023-06-29 ENCOUNTER — Ambulatory Visit: Payer: BC Managed Care – PPO | Attending: Cardiology | Admitting: Cardiology

## 2023-06-29 VITALS — BP 102/64 | HR 73 | Ht 62.0 in | Wt 182.0 lb

## 2023-06-29 DIAGNOSIS — R079 Chest pain, unspecified: Secondary | ICD-10-CM | POA: Diagnosis not present

## 2023-06-29 DIAGNOSIS — R002 Palpitations: Secondary | ICD-10-CM

## 2023-06-29 DIAGNOSIS — R0609 Other forms of dyspnea: Secondary | ICD-10-CM | POA: Diagnosis not present

## 2023-06-29 NOTE — Progress Notes (Signed)
Cardiology Office Note:    Date:  06/29/2023   ID:  Tonya Love, DOB 16-Aug-1976, MRN [id number]  PCP:  Pcp, No  Cardiologist:  Little Ishikawa, MD  Electrophysiologist:  None   Referring MD: No ref. provider found   Chief Complaint  Patient presents with   Palpitations    History of Present Illness:    Tonya Love is a 47 y.o. female with a hx of anxiety, depression, pancreatitis who presents for follow-up.  She is seen initially for evaluation of palpitations and chest pain on 06/30/2021.Tonya Love  She reports she has been having palpitations which she describes as feeling her heart will race and then go slow.  Lasts for couple minutes, can occur throughout the day.  Also reports feeling short of breath with exertion.  In addition reports occasional chest pains.  Describes as sharp pain left-sided pain.  Can occur at rest.  However does report can be brought on with exertion.  She walks 1-2 times per week for 3 miles and will sometimes have chest pain when walking.  She has been told she seems to stop breathing when she sleeps.  States that she feels tired throughout the day.  Has been told she snores.  No smoking history.  Family history includes paternal uncle died of MI at 53.  Zio patch x9 days on 07/21/2021 showed no significant abnormalities.  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  Echocardiogram 11/04/2021 showed normal biventricular function, no significant valvular disease.  Since last clinic visit, she reports he is doing okay.  Had recent episode of chest pain.  She is walking 2 times a week for 45 to 60 minutes.  Denies exertional chest pain but does get short of breath sometimes walking up hills.  Reports occasional lightheadedness but denies any syncope.  Reports has been having palpitations, can happen every day, feels like heart is pounding, last about 1 minute.  She is monitor her heart rate on  her watch and has been up to 110s while resting.  Past Medical History:  Diagnosis Date   Anxiety    Depression    GERD (gastroesophageal reflux disease)    Helicobacter pylori gastritis 10/2021   Treated with generic Prevpac, then treated with levofloxacin, amoxicillin, and PPI BID (July 2023)   Migraines    Pancreatitis    Sleep apnea     Past Surgical History:  Procedure Laterality Date   BIOPSY  11/06/2019   Procedure: BIOPSY;  Surgeon: Corbin Ade, MD;  [facility];  Service: Endoscopy;;   BIOPSY  11/12/2021   Procedure: BIOPSY;  Surgeon: Lanelle Bal, DO;  [facility];  Service: Endoscopy;;   CHOLECYSTECTOMY     COLONOSCOPY N/A 11/06/2019   Procedure: COLONOSCOPY;  Surgeon: Corbin Ade, diverticulosis in the sigmoid colon, minimal grade 2 hemorrhoids, otherwise normal.  Segmental biopsies benign.  Repeat colonoscopy in 10 years.   ESOPHAGOGASTRODUODENOSCOPY N/A 11/06/2019   Procedure: ESOPHAGOGASTRODUODENOSCOPY (EGD);  Surgeon: Corbin Ade, MD; mild Schatzki's ring s/p dilation, abnormal distal esophageal mucosa with benign biopsies, small hiatal hernia, otherwise normal.   ESOPHAGOGASTRODUODENOSCOPY (EGD) WITH PROPOFOL N/A 11/12/2021   Surgeon: Lanelle Bal, DO; small hiatal hernia, mild Schatzki's ring dilated, esophageal biopsies with reflux changes, H. pylori gastritis.  Treated with Prevpac.   MALONEY DILATION N/A 11/06/2019   Procedure: Elease Hashimoto DILATION;  Surgeon: Corbin Ade, MD;  [facility];  Service: Endoscopy;  Laterality: N/A;   TUBAL LIGATION      Current Medications: Current Meds  Medication Sig   cholestyramine (QUESTRAN) 4 g packet Take 1 packet (4 g total) by mouth daily before lunch.   ferrous sulfate 325 (65 FE) MG tablet Take 325 mg by mouth daily with breakfast.   Fremanezumab-vfrm (AJOVY) 225 MG/1.5ML SOAJ INJECT 1 PEN INTO THE SKIN EVERY 30 DAYS   gabapentin (NEURONTIN) 100 MG capsule TAKE  1 CAPSULE(100 MG) BY MOUTH TWICE DAILY   indomethacin (INDOCIN) 25 MG capsule TAKE 1 CAPSULE BY MOUTH DAILY AT 12 PM   pantoprazole (PROTONIX) 40 MG tablet Take 40 mg by mouth 2 (two) times daily.   rizatriptan (MAXALT) 10 MG tablet Take 1 tablet at the onset of migraine. May repeat in 2 hours if needed     Allergies:   Codeine   Social History   Socioeconomic History   Marital status: Legally Separated    Spouse name: Not on file   Number of children: Not on file   Years of education: Not on file   Highest education level: Not on file  Occupational History   Not on file  Tobacco Use   Smoking status: Never    Passive exposure: Past   Smokeless tobacco: Never  Vaping Use   Vaping status: Never Used  Substance and Sexual Activity   Alcohol use: No   Drug use: No   Sexual activity: Not Currently  Other Topics Concern   Not on file  Social History Narrative   Lives alone    Right handed   Caffeine: 2 sodas/day   Social Determinants of Health   Financial Resource Strain: Low Risk  (06/26/2018)   [facility]   Overall Financial Resource Strain (CARDIA)    Difficulty of Paying Living Expenses: Not hard at all  Food Insecurity: Unknown (06/27/2023)   [facility] from [facility]   Hunger Vital Sign    Worried About Running Out of Food in the Last Year: Not on file    Ran Out of Food in the Last Year: Never true  Transportation Needs: No Transportation Needs (06/27/2023)   Received from [facility]   PRAPARE - Transportation    Lack of Transportation (Medical): No    Lack of Transportation (Non-Medical): No  Physical Activity: Inactive (06/26/2018)   [facility]   Exercise Vital Sign    Days of Exercise per Week: 0 days    Minutes of Exercise per Session: 0 min  Stress: Not on file  Social Connections: Not on file     Family History: The patient's family history includes Alcohol abuse in her  father and paternal grandfather; Cancer in her mother; Depression in her maternal aunt and paternal aunt; Hypertension in her mother. There is no history of Colon cancer or Pancreatic cancer.  ROS:   Please see the history of present illness.     All other systems reviewed and are negative.  EKGs/Labs/Other Studies Reviewed:    The following studies were reviewed today:   EKG:   06/29/2023: Normal sinus rhythm, rate 73, no ST abnormalities  Recent Labs: No results found for requested labs within last 365 days.  Recent Lipid Panel    Component Value Date/Time   CHOL 178 06/30/2021 1457   TRIG 190 (H) 06/30/2021 1457   HDL 43 06/30/2021 1457   CHOLHDL 4.1  06/30/2021 1457   VLDL 38 06/30/2021 1457   LDLCALC 97 06/30/2021 1457    Physical Exam:    VS:  BP 102/64   Pulse 73   Ht 5\' 2"  (1.575 m)   Wt 182 lb (82.6 kg)   SpO2 98%   BMI 33.29 kg/m     Wt Readings from Last 3 Encounters:  06/29/23 182 lb (82.6 kg)  04/06/23 186 lb (84.4 kg)  10/20/22 189 lb (85.7 kg)     GEN:  Well nourished, well developed in no acute distress HEENT: Normal NECK: No JVD; No carotid bruits LYMPHATICS: No lymphadenopathy CARDIAC: RRR, no murmurs, rubs, gallops RESPIRATORY:  Clear to auscultation without rales, wheezing or rhonchi  ABDOMEN: Soft, non-tender, non-distended MUSCULOSKELETAL:  No edema; No deformity  SKIN: Warm and dry NEUROLOGIC:  Alert and oriented x 3 PSYCHIATRIC:  Normal affect   ASSESSMENT:    1. Heart palpitations   2. Chest pain of uncertain etiology   3. DOE (dyspnea on exertion)       PLAN:    Palpitations: Zio patch x9 days on 07/21/2021 showed no significant abnormalities.  -She is reporting different type of palpitations than before, occurring more frequently and feels like heart pounding.  On her watch has noted heart rates up to 110s while resting.  Check Zio patch x 7 days to evaluate for arrhythmia  Chest pain: Atypical in description but does  report can be brought on by exertion.  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  Echocardiogram 11/04/2021 showed normal biventricular function, no significant valvular disease.  DOE: Echocardiogram 11/04/2021 showed normal biventricular function, no significant valvular disease.  OSA: positive sleep study 11/2021, follows with Dr. Craige Cotta.  Reports compliance with CPAP  RTC in 6 months  Medication Adjustments/Labs and Tests Ordered: Current medicines are reviewed at length with the patient today.  Concerns regarding medicines are outlined above.  Orders Placed This Encounter  Procedures   LONG TERM MONITOR (3-14 DAYS)   EKG 12-Lead   No orders of the defined types were placed in this encounter.   Patient Instructions  Medication Instructions:  No changes *If you need a refill on your cardiac medications before your next appointment, please call your pharmacy*  Testing/Procedures: Your physician has recommended that you wear a 7 DAY ZIO-PATCH monitor. The Zio patch cardiac monitor continuously records heart rhythm data for up to 14 days, this is for patients being evaluated for multiple types heart rhythms. For the first 24 hours post application, please avoid getting the Zio monitor wet in the shower or by excessive sweating during exercise. After that, feel free to carry on with regular activities. Keep soaps and lotions away from the ZIO XT Patch.  This will be mailed to you, please expect 7-10 days to receive.    Applying the monitor   Shave hair from upper left chest.   Hold abrader disc by orange tab.  Rub abrader in 40 strokes over left upper chest as indicated in your monitor instructions.   Clean area with 4 enclosed alcohol pads .  Use all pads to assure are is cleaned thoroughly.  Let dry.   Apply patch as indicated in monitor instructions.  Patch will be place under collarbone on left side of  chest with arrow pointing upward.   Rub patch adhesive wings for 2 minutes.Remove white label marked "1".  Remove white label marked "2".  Rub patch  adhesive wings for 2 additional minutes.   While looking in a mirror, press and release button in center of patch.  A small green light will flash 3-4 times .  This will be your only indicator the monitor has been turned on.     Do not shower for the first 24 hours.  You may shower after the first 24 hours.   Press button if you feel a symptom. You will hear a small click.  Record Date, Time and Symptom in the Patient Log Book.   When you are ready to remove patch, follow instructions on last 2 pages of Patient Log Book.  Stick patch monitor onto last page of Patient Log Book.   Place Patient Log Book in [place] box.  Use locking tab on box and tape box closed securely.  The [employer] has [employer] on it.  Please place in mailbox as soon as possible.  Your physician should have your test results approximately 7 days after the monitor has been mailed back to [facility].   Call [facility] Customer Care at [phone] if you have questions regarding your ZIO XT patch monitor.  Call them immediately if you see an orange light blinking on your monitor.   If your monitor falls off in less than 4 days contact our Monitor department at [phone].  If your monitor becomes loose or falls off after 4 days call [employer] at [phone] for suggestions on securing your monitor    Follow-Up: At [facility], you and your health needs are our priority.  As part of our continuing mission to provide you with exceptional heart care, we have created designated Provider Care Teams.  These Care Teams include your primary Cardiologist (physician) and Advanced Practice Providers (APPs -  Physician Assistants and Nurse Practitioners) who all work together to provide you with the care you need, when you need it.  We recommend signing up  for the patient portal called "MyChart".  Sign up information is provided on this After Visit Summary.  MyChart is used to connect with patients for Virtual Visits (Telemedicine).  Patients are able to view lab/test results, encounter notes, upcoming appointments, etc.  Non-urgent messages can be sent to your provider as well.   To learn more about what you can do with MyChart, go to [email or web address].    Your next appointment:   6 month(s)  Provider:   Little Ishikawa, MD       Signed, Little Ishikawa, MD  06/29/2023 3:19 PM    [facility]

## 2023-06-29 NOTE — Progress Notes (Unsigned)
Enrolled for [employer] to mail a ZIO XT long term holter monitor to the patients address on file.  

## 2023-06-29 NOTE — Patient Instructions (Signed)
Medication Instructions:  No changes *If you need a refill on your cardiac medications before your next appointment, please call your pharmacy*  Testing/Procedures: Your physician has recommended that you wear a 7 DAY ZIO-PATCH monitor. The Zio patch cardiac monitor continuously records heart rhythm data for up to 14 days, this is for patients being evaluated for multiple types heart rhythms. For the first 24 hours post application, please avoid getting the Zio monitor wet in the shower or by excessive sweating during exercise. After that, feel free to carry on with regular activities. Keep soaps and lotions away from the ZIO XT Patch.  This will be mailed to you, please expect 7-10 days to receive.    Applying the monitor   Shave hair from upper left chest.   Hold abrader disc by orange tab.  Rub abrader in 40 strokes over left upper chest as indicated in your monitor instructions.   Clean area with 4 enclosed alcohol pads .  Use all pads to assure are is cleaned thoroughly.  Let dry.   Apply patch as indicated in monitor instructions.  Patch will be place under collarbone on left side of chest with arrow pointing upward.   Rub patch adhesive wings for 2 minutes.Remove white label marked "1".  Remove white label marked "2".  Rub patch adhesive wings for 2 additional minutes.   While looking in a mirror, press and release button in center of patch.  A small green light will flash 3-4 times .  This will be your only indicator the monitor has been turned on.     Do not shower for the first 24 hours.  You may shower after the first 24 hours.   Press button if you feel a symptom. You will hear a small click.  Record Date, Time and Symptom in the Patient Log Book.   When you are ready to remove patch, follow instructions on last 2 pages of Patient Log Book.  Stick patch monitor onto last page of Patient Log Book.   Place Patient Log Book in [place] box.  Use locking tab on box and tape box closed  securely.  The [employer] and [employer] has [employer] on it.  Please place in mailbox as soon as possible.  Your physician should have your test results approximately 7 days after the monitor has been mailed back to [facility].   Call [facility] Customer Care at [phone] if you have questions regarding your ZIO XT patch monitor.  Call them immediately if you see an orange light blinking on your monitor.   If your monitor falls off in less than 4 days contact our Monitor department at [phone].  If your monitor becomes loose or falls off after 4 days call [employer] at [phone] for suggestions on securing your monitor    Follow-Up: At [facility], you and your health needs are our priority.  As part of our continuing mission to provide you with exceptional heart care, we have created designated Provider Care Teams.  These Care Teams include your primary Cardiologist (physician) and Advanced Practice Providers (APPs -  Physician Assistants and Nurse Practitioners) who all work together to provide you with the care you need, when you need it.  We recommend signing up for the patient portal called "MyChart".  Sign up information is provided on this After Visit Summary.  MyChart is used to connect with patients for Virtual Visits (Telemedicine).  Patients are able to view lab/test results, encounter notes, upcoming  appointments, etc.  Non-urgent messages can be sent to your provider as well.   To learn more about what you can do with MyChart, go to [email or web address].    Your next appointment:   6 month(s)  Provider:   Little Ishikawa, MD

## 2023-07-08 DIAGNOSIS — R002 Palpitations: Secondary | ICD-10-CM | POA: Diagnosis not present

## 2023-07-10 ENCOUNTER — Other Ambulatory Visit: Payer: Self-pay | Admitting: Adult Health

## 2023-07-10 DIAGNOSIS — S233XXA Sprain of ligaments of thoracic spine, initial encounter: Secondary | ICD-10-CM | POA: Diagnosis not present

## 2023-07-10 DIAGNOSIS — M7062 Trochanteric bursitis, left hip: Secondary | ICD-10-CM | POA: Diagnosis not present

## 2023-07-10 DIAGNOSIS — S338XXA Sprain of other parts of lumbar spine and pelvis, initial encounter: Secondary | ICD-10-CM | POA: Diagnosis not present

## 2023-07-10 DIAGNOSIS — S134XXA Sprain of ligaments of cervical spine, initial encounter: Secondary | ICD-10-CM | POA: Diagnosis not present

## 2023-07-11 NOTE — Telephone Encounter (Signed)
Last seen on 10/20/22 per note " Continue gabapentin 100 mg BID " Follow up scheduled on 10/26/23

## 2023-07-12 DIAGNOSIS — S134XXA Sprain of ligaments of cervical spine, initial encounter: Secondary | ICD-10-CM | POA: Diagnosis not present

## 2023-07-12 DIAGNOSIS — S233XXA Sprain of ligaments of thoracic spine, initial encounter: Secondary | ICD-10-CM | POA: Diagnosis not present

## 2023-07-12 DIAGNOSIS — M7062 Trochanteric bursitis, left hip: Secondary | ICD-10-CM | POA: Diagnosis not present

## 2023-07-12 DIAGNOSIS — S338XXA Sprain of other parts of lumbar spine and pelvis, initial encounter: Secondary | ICD-10-CM | POA: Diagnosis not present

## 2023-07-19 DIAGNOSIS — S233XXA Sprain of ligaments of thoracic spine, initial encounter: Secondary | ICD-10-CM | POA: Diagnosis not present

## 2023-07-19 DIAGNOSIS — S134XXA Sprain of ligaments of cervical spine, initial encounter: Secondary | ICD-10-CM | POA: Diagnosis not present

## 2023-07-19 DIAGNOSIS — M7062 Trochanteric bursitis, left hip: Secondary | ICD-10-CM | POA: Diagnosis not present

## 2023-07-19 DIAGNOSIS — S338XXA Sprain of other parts of lumbar spine and pelvis, initial encounter: Secondary | ICD-10-CM | POA: Diagnosis not present

## 2023-07-26 DIAGNOSIS — S134XXA Sprain of ligaments of cervical spine, initial encounter: Secondary | ICD-10-CM | POA: Diagnosis not present

## 2023-07-26 DIAGNOSIS — S338XXA Sprain of other parts of lumbar spine and pelvis, initial encounter: Secondary | ICD-10-CM | POA: Diagnosis not present

## 2023-07-26 DIAGNOSIS — M7062 Trochanteric bursitis, left hip: Secondary | ICD-10-CM | POA: Diagnosis not present

## 2023-07-26 DIAGNOSIS — S233XXA Sprain of ligaments of thoracic spine, initial encounter: Secondary | ICD-10-CM | POA: Diagnosis not present

## 2023-07-27 DIAGNOSIS — L03032 Cellulitis of left toe: Secondary | ICD-10-CM | POA: Diagnosis not present

## 2023-07-27 DIAGNOSIS — M79672 Pain in left foot: Secondary | ICD-10-CM | POA: Diagnosis not present

## 2023-07-27 DIAGNOSIS — M79675 Pain in left toe(s): Secondary | ICD-10-CM | POA: Diagnosis not present

## 2023-07-27 DIAGNOSIS — L600 Ingrowing nail: Secondary | ICD-10-CM | POA: Diagnosis not present

## 2023-08-02 DIAGNOSIS — S134XXA Sprain of ligaments of cervical spine, initial encounter: Secondary | ICD-10-CM | POA: Diagnosis not present

## 2023-08-02 DIAGNOSIS — S233XXA Sprain of ligaments of thoracic spine, initial encounter: Secondary | ICD-10-CM | POA: Diagnosis not present

## 2023-08-02 DIAGNOSIS — M7062 Trochanteric bursitis, left hip: Secondary | ICD-10-CM | POA: Diagnosis not present

## 2023-08-02 DIAGNOSIS — S338XXA Sprain of other parts of lumbar spine and pelvis, initial encounter: Secondary | ICD-10-CM | POA: Diagnosis not present

## 2023-08-09 DIAGNOSIS — S338XXA Sprain of other parts of lumbar spine and pelvis, initial encounter: Secondary | ICD-10-CM | POA: Diagnosis not present

## 2023-08-09 DIAGNOSIS — S134XXA Sprain of ligaments of cervical spine, initial encounter: Secondary | ICD-10-CM | POA: Diagnosis not present

## 2023-08-09 DIAGNOSIS — S233XXA Sprain of ligaments of thoracic spine, initial encounter: Secondary | ICD-10-CM | POA: Diagnosis not present

## 2023-08-09 DIAGNOSIS — M7062 Trochanteric bursitis, left hip: Secondary | ICD-10-CM | POA: Diagnosis not present

## 2023-08-10 DIAGNOSIS — M79675 Pain in left toe(s): Secondary | ICD-10-CM | POA: Diagnosis not present

## 2023-08-10 DIAGNOSIS — L03032 Cellulitis of left toe: Secondary | ICD-10-CM | POA: Diagnosis not present

## 2023-08-10 DIAGNOSIS — M79672 Pain in left foot: Secondary | ICD-10-CM | POA: Diagnosis not present

## 2023-08-18 ENCOUNTER — Other Ambulatory Visit: Payer: Self-pay | Admitting: Adult Health

## 2023-08-23 ENCOUNTER — Telehealth: Payer: Self-pay | Admitting: *Deleted

## 2023-08-23 ENCOUNTER — Other Ambulatory Visit: Payer: Self-pay | Admitting: Gastroenterology

## 2023-08-23 MED ORDER — PANTOPRAZOLE SODIUM 40 MG PO TBEC: 40.0000 mg | DELAYED_RELEASE_TABLET | Freq: Two times a day (BID) | ORAL | 1 refills | Status: AC

## 2023-08-23 NOTE — Telephone Encounter (Signed)
Rx sent 

## 2023-08-23 NOTE — Telephone Encounter (Signed)
Pt called and needs a refill on pantoprazole 40mg . Pt last OV 04/06/2023

## 2023-09-18 DIAGNOSIS — R519 Headache, unspecified: Secondary | ICD-10-CM | POA: Diagnosis not present

## 2023-09-18 DIAGNOSIS — Z20822 Contact with and (suspected) exposure to covid-19: Secondary | ICD-10-CM | POA: Diagnosis not present

## 2023-09-18 DIAGNOSIS — J00 Acute nasopharyngitis [common cold]: Secondary | ICD-10-CM | POA: Diagnosis not present

## 2023-10-04 ENCOUNTER — Telehealth: Payer: Self-pay | Admitting: Adult Health

## 2023-10-04 ENCOUNTER — Ambulatory Visit: Payer: BC Managed Care – PPO | Admitting: Orthopedic Surgery

## 2023-10-04 ENCOUNTER — Other Ambulatory Visit (INDEPENDENT_AMBULATORY_CARE_PROVIDER_SITE_OTHER): Payer: Self-pay

## 2023-10-04 ENCOUNTER — Encounter: Payer: Self-pay | Admitting: Orthopedic Surgery

## 2023-10-04 VITALS — BP 125/81 | HR 85 | Ht 62.0 in | Wt 196.0 lb

## 2023-10-04 DIAGNOSIS — M25511 Pain in right shoulder: Secondary | ICD-10-CM

## 2023-10-04 NOTE — Patient Instructions (Signed)

## 2023-10-04 NOTE — Telephone Encounter (Signed)
 Pt r/s appt due to having court. Wait listed

## 2023-10-05 ENCOUNTER — Encounter: Payer: Self-pay | Admitting: Orthopedic Surgery

## 2023-10-05 NOTE — Progress Notes (Signed)
New Patient Visit  Assessment: Tonya Love is a 48 y.o. female with the following: 1. Acute pain of right shoulder  Plan: Tonya Love has pain in the right shoulder.  No specific injury.  She has pretty good range of motion and good strength.  Low concern for a significant tear.  She may have some irritation of the tendons.  This was discussed with the patient.  Radiographs are negative.  I have offered her steroid injection, and she elected to proceed.  Procedure note injection - Right shoulder    Verbal consent was obtained to inject the right shoulder, subacromial space Timeout was completed to confirm the site of injection.   The skin was prepped with alcohol and ethyl chloride was sprayed at the injection site.  A 21-gauge needle was used to inject 40 mg of Depo-Medrol and 1% lidocaine (4 cc) into the subacromial space of the right shoulder using a posterolateral approach.  There were no complications.  A sterile bandage was applied.    Follow-up: Return if symptoms worsen or fail to improve.  Subjective:  Chief Complaint  Patient presents with   Shoulder Pain    Right- no injury been hurting for about a month hast tried ibuprofen and icy hot patches doesn't help have to lift over my head with my job and that bothers it    History of Present Illness: Tonya Love is a 48 y.o. female who presents for evaluation of right shoulder pain.  She is right-handed.  She states that she has had pain in the right shoulder for the past month.  No specific injury.  She does do some lifting overhead at work, but she denies noticing a pop or pulling sensation.  She did not fall.  No recent car accidents.  Since the onset of pain, she has tried some patches, as well as some ibuprofen.  She states it will ache when she is sitting.  She notes some radiating pains into the bicep.  No numbness or tingling.  No pain into her neck.   Review of Systems: No fevers or chills No numbness  or tingling No chest pain No shortness of breath No bowel or bladder dysfunction No GI distress No headaches   Medical History:  Past Medical History:  Diagnosis Date   Anxiety    Depression    GERD (gastroesophageal reflux disease)    Helicobacter pylori gastritis 10/2021   Treated with generic Prevpac, then treated with levofloxacin, amoxicillin, and PPI BID (July 2023)   Migraines    Pancreatitis    Sleep apnea     Past Surgical History:  Procedure Laterality Date   BIOPSY  11/06/2019   Procedure: BIOPSY;  Surgeon: Corbin Ade, MD;  [facility];  Service: Endoscopy;;   BIOPSY  11/12/2021   Procedure: BIOPSY;  Surgeon: Lanelle Bal, DO;  [facility];  Service: Endoscopy;;   CHOLECYSTECTOMY     COLONOSCOPY N/A 11/06/2019   Procedure: COLONOSCOPY;  Surgeon: Corbin Ade, diverticulosis in the sigmoid colon, minimal grade 2 hemorrhoids, otherwise normal.  Segmental biopsies benign.  Repeat colonoscopy in 10 years.   ESOPHAGOGASTRODUODENOSCOPY N/A 11/06/2019   Procedure: ESOPHAGOGASTRODUODENOSCOPY (EGD);  Surgeon: Corbin Ade, MD; mild Schatzki's ring s/p dilation, abnormal distal esophageal mucosa with benign biopsies, small hiatal hernia, otherwise normal.   ESOPHAGOGASTRODUODENOSCOPY (EGD) WITH PROPOFOL N/A 11/12/2021   Surgeon: Lanelle Bal, DO; small hiatal hernia, mild Schatzki's ring dilated, esophageal biopsies with  reflux changes, H. pylori gastritis.  Treated with Prevpac.   MALONEY DILATION N/A 11/06/2019   Procedure: Elease Hashimoto DILATION;  Surgeon: Corbin Ade, MD;  [facility];  Service: Endoscopy;  Laterality: N/A;   TUBAL LIGATION      Family History  Problem Relation Age of Onset   Hypertension Mother    Cancer Mother    Alcohol abuse Father    Alcohol abuse Paternal Grandfather    Depression Maternal Aunt    Depression Paternal Aunt    Colon cancer Neg Hx    Pancreatic cancer Neg Hx    Social  History   Tobacco Use   Smoking status: Never    Passive exposure: Past   Smokeless tobacco: Never  Vaping Use   Vaping status: Never Used  Substance Use Topics   Alcohol use: No   Drug use: No    Allergies  Allergen Reactions   Codeine Nausea Only    Current Meds  Medication Sig   cholestyramine (QUESTRAN) 4 g packet Take 1 packet (4 g total) by mouth daily before lunch.   ferrous sulfate 325 (65 FE) MG tablet Take 325 mg by mouth daily with breakfast.   Fremanezumab-vfrm (AJOVY) 225 MG/1.5ML SOAJ INJECT 1 PEN INTO THE SKIN EVERY 30 DAYS   gabapentin (NEURONTIN) 100 MG capsule TAKE 1 CAPSULE(100 MG) BY MOUTH TWICE DAILY   indomethacin (INDOCIN) 25 MG capsule TAKE 1 CAPSULE BY MOUTH DAILY AT 12 PM   pantoprazole (PROTONIX) 40 MG tablet Take 1 tablet (40 mg total) by mouth 2 (two) times daily.   rizatriptan (MAXALT) 10 MG tablet Take 1 tablet at the onset of migraine. May repeat in 2 hours if needed    Objective: BP 125/81   Pulse 85   Ht 5\' 2"  (1.575 m)   Wt 196 lb (88.9 kg)   BMI 35.85 kg/m   Physical Exam:  General: Alert and oriented. and No acute distress. Gait: Normal gait.  Evaluation the right shoulder demonstrates no deformity.  No redness.  She does have tenderness to palpation over the anterior and lateral aspect of his shoulder.  Forward flexion 160 degrees.  Abduction 110 degrees.  Internal rotation to lumbar spine.  External rotation is similar to the contralateral side.  Discomfort with strength testing, but good strength otherwise.  Positive O'Brien's.  Positive speeds.  IMAGING: I personally ordered and reviewed the following images  X-rays of the right shoulder were obtained in clinic today.  No acute injuries are noted.  Well-positioned glenohumeral joint.  No evidence of proximal humeral migration.  No osteophytes.  No bony lesions.  Impression: Negative right shoulder x-ray   New Medications:  No orders of the defined types were placed in this  encounter.     Oliver Barre, MD  10/05/2023 7:42 AM

## 2023-10-05 NOTE — Progress Notes (Signed)
Referring Provider: No ref. provider found Primary Care Physician:  Pcp, No Primary GI Physician: Dr. Jena Gauss  Chief Complaint  Patient presents with   Follow-up    Follow up. No problems     HPI:   Tonya Love is a 48 y.o. female presenting today for follow-up.   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 Also with  mild anemia suspected to be secondary to menorrhagia.    Last seen in the office 04/06/23. GERD well controlled on pantoprazole 40 mg BID. Chronic nausea occurring every couple of weeks and responded well to zofran. Having 1 urgent loose BM after lunch taking Questran in the morning before breakfast. Recommended she try taking Questran prior to lunch as this was her largest meal of the day.   Today:  GERD:  Doing well on pantoprazole 40 mg twice daily.  She has noticed recurrent solid food dysphagia occurring a few times a week.   Diarrhea:  Doing fairly well on Questran 4 g before lunch.  Can have 1 normal stool per day or sometimes will have 2 or 3/day.  2 or 3/day may occur a couple times a week.  No specific dietary triggers.  She is satisfied with her current medication regimen.  No BRBPR or melena.  She has noticed some increased gas over the last month or so.  No specific triggers.  She does try to limit dairy as this will make her nauseated.  Nausea:  Over the last week, she has had nausea in the afternoon between 3 and 5 PM.  No vomiting.  No heartburn during these times.  No abdominal pain.  Has not used Zofran.  No specific trigger.  She is going to be starting her menstrual cycle in  the next few days.  She has been taking ibuprofen a couple times a week recently for tooth pain.   Prior GI Work-up:  Colonoscopy Feb 2021 with diverticulosis in sigmoid colon, minimal grade 2 hemorrhoids, and benign random colon biopsies with recommendations to repeat in 10 years.     EGD Feb 2021 with mild Schatzki ring s/p dilation, abnormal distal esophageal mucosa with benign biopsy, small hiatal hernia, otherwise normal exam.    GES wnl in April 2021.     Celiac serologies negative.   CT A/P with contrast 05/08/2020 with no significant findings.      CT A/P with contrast completed 09/28/2021 with no acute findings.   EGD 11/12/2021 revealing Schatzki's ring dilated, small hiatal hernia, erythematous mucosa in the gastric antrum. Esophageal biopsies revealed reactive squamous mucosa with increased intraepithelial lymphocytes and rare eosinophils. Gastric biopsy with chronic gastritis with H. pylori.    Gastric emptying study 06/30/2022: Normal.  H pylori breath test negative November 2023  Past Medical History:  Diagnosis Date   Anxiety    Depression    GERD (gastroesophageal reflux disease)    Helicobacter pylori gastritis 10/2021   Treated with generic Prevpac, then treated with levofloxacin, amoxicillin, and PPI BID (July 2023)   Migraines    Pancreatitis    Sleep apnea     Past Surgical History:  Procedure Laterality Date   BIOPSY  11/06/2019  Procedure: BIOPSY;  Surgeon: Corbin Ade, MD;  [facility];  Service: Endoscopy;;   BIOPSY  11/12/2021   Procedure: BIOPSY;  Surgeon: Lanelle Bal, DO;  [facility];  Service: Endoscopy;;   CHOLECYSTECTOMY     COLONOSCOPY N/A 11/06/2019   Procedure: COLONOSCOPY;  Surgeon: Corbin Ade, diverticulosis in the sigmoid colon, minimal grade 2 hemorrhoids, otherwise normal.  Segmental biopsies benign.  Repeat colonoscopy in 10 years.   ESOPHAGOGASTRODUODENOSCOPY N/A 11/06/2019   Procedure:  ESOPHAGOGASTRODUODENOSCOPY (EGD);  Surgeon: Corbin Ade, MD; mild Schatzki's ring s/p dilation, abnormal distal esophageal mucosa with benign biopsies, small hiatal hernia, otherwise normal.   ESOPHAGOGASTRODUODENOSCOPY (EGD) WITH PROPOFOL N/A 11/12/2021   Surgeon: Lanelle Bal, DO; small hiatal hernia, mild Schatzki's ring dilated, esophageal biopsies with reflux changes, H. pylori gastritis.  Treated with Prevpac.   MALONEY DILATION N/A 11/06/2019   Procedure: Elease Hashimoto DILATION;  Surgeon: Corbin Ade, MD;  [facility];  Service: Endoscopy;  Laterality: N/A;   TUBAL LIGATION      Current Outpatient Medications  Medication Sig Dispense Refill   cholestyramine (QUESTRAN) 4 g packet Take 1 packet (4 g total) by mouth daily before lunch. 90 each 1   ferrous sulfate 325 (65 FE) MG tablet Take 325 mg by mouth daily with breakfast.     Fremanezumab-vfrm (AJOVY) 225 MG/1.5ML SOAJ INJECT 1 PEN INTO THE SKIN EVERY 30 DAYS 4.5 mL 2   gabapentin (NEURONTIN) 100 MG capsule TAKE 1 CAPSULE(100 MG) BY MOUTH TWICE DAILY 180 capsule 1   ondansetron (ZOFRAN) 4 MG tablet Take 1 tablet (4 mg total) by mouth every 8 (eight) hours as needed for nausea or vomiting. 30 tablet 0   pantoprazole (PROTONIX) 40 MG tablet Take 1 tablet (40 mg total) by mouth 2 (two) times daily. 180 tablet 1   rizatriptan (MAXALT) 10 MG tablet Take 1 tablet at the onset of migraine. May repeat in 2 hours if needed 10 tablet 11   indomethacin (INDOCIN) 25 MG capsule TAKE 1 CAPSULE BY MOUTH DAILY AT 12 PM (Patient not taking: Reported on 10/06/2023) 42 capsule 0   No current facility-administered medications for this visit.    Allergies as of 10/06/2023 - Review Complete 10/06/2023  Allergen Reaction Noted   Codeine Nausea Only 12/25/2014    Family History  Problem Relation Age of Onset   Hypertension Mother    Cancer Mother    Alcohol abuse Father    Alcohol abuse Paternal Grandfather    Depression Maternal  Aunt    Depression Paternal Aunt    Colon cancer Neg Hx    Pancreatic cancer Neg Hx     Social History   Socioeconomic History   Marital status: Legally Separated    Spouse name: Not on file   Number of children: Not on file   Years of education: Not on file   Highest education level: Not on file  Occupational History   Not on file  Tobacco Use   Smoking status: Never    Passive exposure: Past   Smokeless tobacco: Never  Vaping Use   Vaping status: Never Used  Substance and Sexual Activity   Alcohol use: No   Drug use: No   Sexual activity: Not Currently  Other Topics Concern   Not on file  Social History Narrative   Lives alone    Right handed   Caffeine: 2 sodas/day   Social Drivers of Dispensing optician  Resource Strain: Low Risk  (06/26/2018)   [facility]   Overall Financial Resource Strain (CARDIA)    Difficulty of Paying Living Expenses: Not hard at all  Food Insecurity: Unknown (06/27/2023)   Received from [facility]   Hunger Vital Sign    Worried About Running Out of Food in the Last Year: Not on file    Ran Out of Food in the Last Year: Never true  Transportation Needs: No Transportation Needs (06/27/2023)   [facility] - [facility]    Lack of Transportation (Medical): No    Lack of Transportation (Non-Medical): No  Physical Activity: Inactive (06/26/2018)   Received from [facility], [facility]   Exercise Vital Sign    Days of Exercise per Week: 0 days    Minutes of Exercise per Session: 0 min  Stress: Not on file  Social Connections: Not on file    Review of Systems: Gen: Denies fever, chills, cold or flulike symptoms, presyncope, syncope. CV: Denies chest pain, palpitation.  Resp: Denies dyspnea at rest, cough.  GI: See HPI  Heme: See HPI  Physical Exam: BP 128/82 (BP Location: Right Arm, Patient Position: Sitting, Cuff Size: Large)   Pulse 75   Temp 97.7 F  (36.5 C) (Temporal)   Ht 5\' 2"  (1.575 m)   Wt 193 lb 12.8 oz (87.9 kg)   LMP 09/05/2023 (Approximate)   BMI 35.45 kg/m  General:   Alert and oriented. No distress noted. Pleasant and cooperative.  Head:  Normocephalic and atraumatic. Eyes:  Conjuctiva clear without scleral icterus. Heart:  S1, S2 present without murmurs appreciated. Lungs:  Clear to auscultation bilaterally. No wheezes, rales, or rhonchi. No distress.  Abdomen:  +BS, soft, non-tender and non-distended. No rebound or guarding. No HSM or masses noted. Msk:  Symmetrical without gross deformities. Normal posture. Extremities:  Without edema. Neurologic:  Alert and  oriented x4 Psych:  Normal mood and affect.    Assessment:  48 year old female with history of GERD, dysphagia secondary to Schatzki's ring, H. pylori, chronic nausea, diverticulitis, chronic diarrhea, until additional hematochezia secondary to hemorrhoids, presenting today for follow-up.  GERD: Well-controlled on pantoprazole 40 mg twice daily.  Dysphagia: Recurrent solid food dysphagia recently, occurring a few times a week.  Last EGD in February 2023 with Schatzki's ring s/p dilation with improvement in symptoms until now.  Suspect she has recurrent Schatzki's ring.  Chronic nausea: Extensive GI evaluation previously detailed in HPI. She was found to have H. pylori gastritis that was treated and eradication confirmed in November 2023.  Had improvement in nausea thereafter with infrequent occurrences, but notes over the last week, she has had nausea in the afternoons. No associated abdominal pain.  Etiology unclear.  Could be related to hormonal changes as she is getting ready to start her menstrual cycle versus recent NSAID use.  Recommended avoiding NSAIDs, using Zofran as needed, and monitoring for now.  She will be having an upper endoscopy in the near future due to dysphagia which will help to further evaluate her nausea if this continues.  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 Currently taking Questran 4 g  before lunch which is working fairly well for her.  Most days having 1 bowel movement daily, but a couple times a week may have 2 or 3 bowel movements.  She is satisfied with her current bowel  regimen.  Flatulence/excessive gas: Increased gas for the last month.  Recommended trial of daily probiotic, lactose-free diet or Lactaid tablets prior to dairy, avoiding common gas producing items.  Prior celiac screen was negative in 2020.  She will be having upper endoscopy in the near future and duodenal biopsies can be obtained at that time to reevaluate for celiac.  If ongoing symptoms, can consider testing for SIBO and/or sucrase deficiency.    Plan:  Proceed with upper endoscopy with duodenal biopsies and possible dilation with propofol by Dr. Jena Gauss in near future. The risks, benefits, and alternatives have been discussed with the patient in detail. The patient states understanding and desires to proceed.  ASA 2 Continue pantoprazole 40 mg twice daily. Continue Questran 4 g daily. Limit NSAIDs is much as possible. Zofran 4 mg every 8 hours as needed. Start daily probiotic. Lactose-free diet or 3 Lactaid tablets prior to dairy consumption. Avoid common gas producing items including broccoli, cabbage, cauliflower, Brussels sprouts, beans, artificial sweeteners, chewing gum, drinking through a straw, hard candy. Follow-up after EGD.    Ermalinda Memos, PA-C [facility] Gastro[facility] 10/06/2023

## 2023-10-06 ENCOUNTER — Ambulatory Visit (INDEPENDENT_AMBULATORY_CARE_PROVIDER_SITE_OTHER): Payer: BC Managed Care – PPO | Admitting: Gastroenterology

## 2023-10-06 ENCOUNTER — Encounter: Payer: Self-pay | Admitting: Gastroenterology

## 2023-10-06 VITALS — BP 128/82 | HR 75 | Temp 97.7°F | Ht 62.0 in | Wt 193.8 lb

## 2023-10-06 DIAGNOSIS — R143 Flatulence: Secondary | ICD-10-CM

## 2023-10-06 DIAGNOSIS — K219 Gastro-esophageal reflux disease without esophagitis: Secondary | ICD-10-CM | POA: Diagnosis not present

## 2023-10-06 DIAGNOSIS — R110 Nausea: Secondary | ICD-10-CM | POA: Diagnosis not present

## 2023-10-06 DIAGNOSIS — K529 Noninfective gastroenteritis and colitis, unspecified: Secondary | ICD-10-CM | POA: Diagnosis not present

## 2023-10-06 DIAGNOSIS — R1310 Dysphagia, unspecified: Secondary | ICD-10-CM | POA: Diagnosis not present

## 2023-10-06 MED ORDER — ONDANSETRON HCL 4 MG PO TABS: 4.0000 mg | ORAL_TABLET | Freq: Three times a day (TID) | ORAL | 0 refills | Status: AC | PRN

## 2023-10-06 NOTE — Patient Instructions (Addendum)
We will get you scheduled for an upper endoscopy with possible esophageal dilation in the future with Dr. Jena Gauss.  Continue taking pantoprazole 40 mg twice daily 30 minutes before breakfast and dinner.  Continue Questran 4 mg daily before lunch.  Limit NSAIDs is much as possible.  This includes ibuprofen, Aleve, Advil, BC powders, Goody powders, and anything that says "NSAID" on the package.  You may use Zofran 4 mg every 8 hours as needed for nausea.   You can try starting a daily probiotic such as align, Vear Clock' colon health to see if this will help with excessive gas.  I also recommend that you follow a lactose-free diet or take 3 Lactaid tablets prior to any dairy consumption.  Avoid common gas producing items including broccoli, cabbage, cauliflower, Brussels sprouts, beans, artificial sweeteners, chewing gum, drinking through a straw, hard candy.  I will plan to see you back in the office after your upper endoscopy.  It was good to see you today!  Ermalinda Memos, PA-C [facility] Gastroenter[facility]

## 2023-10-09 ENCOUNTER — Encounter: Payer: Self-pay | Admitting: *Deleted

## 2023-10-09 ENCOUNTER — Other Ambulatory Visit: Payer: Self-pay | Admitting: *Deleted

## 2023-10-09 DIAGNOSIS — R1310 Dysphagia, unspecified: Secondary | ICD-10-CM

## 2023-10-26 ENCOUNTER — Ambulatory Visit: Payer: BC Managed Care – PPO | Admitting: Adult Health

## 2023-10-27 DIAGNOSIS — R1310 Dysphagia, unspecified: Secondary | ICD-10-CM | POA: Diagnosis not present

## 2023-10-28 LAB — PREGNANCY, URINE: Preg Test, Ur: NEGATIVE

## 2023-10-30 ENCOUNTER — Encounter (HOSPITAL_COMMUNITY): Payer: Self-pay | Admitting: Internal Medicine

## 2023-10-30 ENCOUNTER — Ambulatory Visit (HOSPITAL_COMMUNITY): Payer: BC Managed Care – PPO | Admitting: Anesthesiology

## 2023-10-30 ENCOUNTER — Other Ambulatory Visit: Payer: Self-pay

## 2023-10-30 ENCOUNTER — Encounter (HOSPITAL_COMMUNITY)
Admission: RE | Disposition: A | Discharge status: Home / Self Care | Payer: Self-pay | Source: Ambulatory Visit | Attending: Internal Medicine

## 2023-10-30 ENCOUNTER — Ambulatory Visit (HOSPITAL_COMMUNITY)
Admission: RE | Admit: 2023-10-30 | Discharge: 2023-10-30 | Disposition: A | Discharge status: Home / Self Care | Payer: BC Managed Care – PPO | Source: Ambulatory Visit | Attending: Internal Medicine | Admitting: Internal Medicine

## 2023-10-30 DIAGNOSIS — R1310 Dysphagia, unspecified: Secondary | ICD-10-CM | POA: Insufficient documentation

## 2023-10-30 DIAGNOSIS — K2950 Unspecified chronic gastritis without bleeding: Secondary | ICD-10-CM | POA: Insufficient documentation

## 2023-10-30 DIAGNOSIS — K222 Esophageal obstruction: Secondary | ICD-10-CM | POA: Diagnosis not present

## 2023-10-30 DIAGNOSIS — I10 Essential (primary) hypertension: Secondary | ICD-10-CM | POA: Insufficient documentation

## 2023-10-30 DIAGNOSIS — Z79899 Other long term (current) drug therapy: Secondary | ICD-10-CM | POA: Insufficient documentation

## 2023-10-30 DIAGNOSIS — K449 Diaphragmatic hernia without obstruction or gangrene: Secondary | ICD-10-CM | POA: Diagnosis not present

## 2023-10-30 DIAGNOSIS — K2289 Other specified disease of esophagus: Secondary | ICD-10-CM | POA: Diagnosis not present

## 2023-10-30 DIAGNOSIS — G4730 Sleep apnea, unspecified: Secondary | ICD-10-CM | POA: Insufficient documentation

## 2023-10-30 DIAGNOSIS — K219 Gastro-esophageal reflux disease without esophagitis: Secondary | ICD-10-CM | POA: Diagnosis not present

## 2023-10-30 HISTORY — PX: MALONEY DILATION: SHX5535

## 2023-10-30 HISTORY — PX: BIOPSY: SHX5522

## 2023-10-30 HISTORY — PX: ESOPHAGOGASTRODUODENOSCOPY (EGD) WITH PROPOFOL: SHX5813

## 2023-10-30 SURGERY — ESOPHAGOGASTRODUODENOSCOPY (EGD) WITH PROPOFOL
Anesthesia: General

## 2023-10-30 MED ORDER — LACTATED RINGERS IV SOLN: INTRAVENOUS | Status: DC

## 2023-10-30 MED ORDER — DEXMEDETOMIDINE HCL IN NACL 80 MCG/20ML IV SOLN
INTRAVENOUS | Status: DC | PRN
  Administered 2023-10-30: 6 ug via INTRAVENOUS

## 2023-10-30 MED ORDER — LACTATED RINGERS IV SOLN: INTRAVENOUS | Status: DC | PRN

## 2023-10-30 MED ORDER — PROPOFOL 10 MG/ML IV BOLUS
INTRAVENOUS | Status: DC | PRN
  Administered 2023-10-30: 125 ug/kg/min via INTRAVENOUS

## 2023-10-30 NOTE — Anesthesia Postprocedure Evaluation (Signed)
 Anesthesia Post Note  Patient: Tonya Love  Procedure(s) Performed: ESOPHAGOGASTRODUODENOSCOPY (EGD) WITH PROPOFOL  MALONEY DILATION BIOPSY  Patient location during evaluation: Phase II Anesthesia Type: General Level of consciousness: awake Pain management: pain level controlled Vital Signs Assessment: post-procedure vital signs reviewed and stable Respiratory status: spontaneous breathing and respiratory function stable Cardiovascular status: blood pressure returned to baseline and stable Postop Assessment: no headache and no apparent nausea or vomiting Anesthetic complications: no Comments: Late entry   No notable events documented.   Last Vitals:  Vitals:   10/30/23 0718 10/30/23 0755  BP: 129/70 (!) 90/44  Pulse: 74 85  Resp: 16 (!) 22  Temp: 37 C 36.6 C  SpO2: 100% 98%    Last Pain:  Vitals:   10/30/23 0755  TempSrc: Oral  PainSc: 0-No pain                 Coretha Dew

## 2023-10-30 NOTE — Transfer of Care (Signed)
 Immediate Anesthesia Transfer of Care Note  Patient: Tonya Love  Procedure(s) Performed: ESOPHAGOGASTRODUODENOSCOPY (EGD) WITH PROPOFOL  MALONEY DILATION BIOPSY  Patient Location: PACU and Endoscopy Unit  Anesthesia Type:MAC  Level of Consciousness: awake and alert   Airway & Oxygen Therapy: Patient Spontanous Breathing and Patient connected to nasal cannula oxygen  Post-op Assessment: Report given to RN and Post -op Vital signs reviewed and stable  Post vital signs: Reviewed and stable  Last Vitals:  Vitals Value Taken Time  BP 90/44 10/30/23 0755  Temp 36.6 C 10/30/23 0755  Pulse 85 10/30/23 0755  Resp 22 10/30/23 0755  SpO2 98 % 10/30/23 0755    Last Pain:  Vitals:   10/30/23 0755  TempSrc: Oral  PainSc: 0-No pain         Complications: No notable events documented.

## 2023-10-30 NOTE — Discharge Instructions (Addendum)
 EGD Discharge instructions Please read the instructions outlined below and refer to this sheet in the next few weeks. These discharge instructions provide you with general information on caring for yourself after you leave the hospital. Your doctor may also give you specific instructions. While your treatment has been planned according to the most current medical practices available, unavoidable complications occasionally occur. If you have any problems or questions after discharge, please call your doctor. ACTIVITY You may resume your regular activity but move at a slower pace for the next 24 hours.  Take frequent rest periods for the next 24 hours.  Walking will help expel (get rid of) the air and reduce the bloated feeling in your abdomen.  No driving for 24 hours (because of the anesthesia (medicine) used during the test).  You may shower.  Do not sign any important legal documents or operate any machinery for 24 hours (because of the anesthesia used during the test).  NUTRITION Drink plenty of fluids.  You may resume your normal diet.  Begin with a light meal and progress to your normal diet.  Avoid alcoholic beverages for 24 hours or as instructed by your caregiver.  MEDICATIONS You may resume your normal medications unless your caregiver tells you otherwise.  WHAT YOU CAN EXPECT TODAY You may experience abdominal discomfort such as a feeling of fullness or "gas" pains.  FOLLOW-UP Your doctor will discuss the results of your test with you.  SEEK IMMEDIATE MEDICAL ATTENTION IF ANY OF THE FOLLOWING OCCUR: Excessive nausea (feeling sick to your stomach) and/or vomiting.  Severe abdominal pain and distention (swelling).  Trouble swallowing.  Temperature over 101 F (37.8 C).  Rectal bleeding or vomiting of blood.    You have mild reflux esophagitis.  Schatzki's ring-dilated  Hiatal hernia.  Your small intestine was biopsied to further evaluate your other GI symptoms  Continue  Protonix  twice daily  Follow-up on pathology  Follow-up appointment Shana Daring in 4 to 6 weeks  At patient request, called Friddie Jetty at [phone] -no answer

## 2023-10-30 NOTE — Anesthesia Preprocedure Evaluation (Signed)
 Anesthesia Evaluation  Patient identified by MRN, date of birth, ID band Patient awake    Reviewed: Allergy & Precautions, H&P , NPO status , Patient's Chart, lab work & pertinent test results, reviewed documented beta blocker date and time   Airway Mallampati: II  TM Distance: >3 FB Neck ROM: full    Dental no notable dental hx.    Pulmonary sleep apnea    Pulmonary exam normal breath sounds clear to auscultation       Cardiovascular Exercise Tolerance: Good hypertension, negative cardio ROS  Rhythm:regular Rate:Normal     Neuro/Psych  Headaches PSYCHIATRIC DISORDERS Anxiety Depression       GI/Hepatic Neg liver ROS,GERD  ,,  Endo/Other  negative endocrine ROS    Renal/GU negative Renal ROS  negative genitourinary   Musculoskeletal   Abdominal   Peds  Hematology  (+) Blood dyscrasia, anemia   Anesthesia Other Findings   Reproductive/Obstetrics negative OB ROS                             Anesthesia Physical Anesthesia Plan  ASA: 2  Anesthesia Plan: General   Post-op Pain Management:    Induction:   PONV Risk Score and Plan: Propofol  infusion  Airway Management Planned:   Additional Equipment:   Intra-op Plan:   Post-operative Plan:   Informed Consent: I have reviewed the patients History and Physical, chart, labs and discussed the procedure including the risks, benefits and alternatives for the proposed anesthesia with the patient or authorized representative who has indicated his/her understanding and acceptance.     Dental Advisory Given  Plan Discussed with: CRNA  Anesthesia Plan Comments:        Anesthesia Quick Evaluation

## 2023-10-30 NOTE — Interval H&P Note (Signed)
 History and Physical Interval Note:  10/30/2023 7:18 AM  Tonya Love  has presented today for surgery, with the diagnosis of dysphagia.  The various methods of treatment have been discussed with the patient and family. After consideration of risks, benefits and other options for treatment, the patient has consented to  Procedure(s) with comments: ESOPHAGOGASTRODUODENOSCOPY (EGD) WITH PROPOFOL  (N/A) - 8:30 am, asa 2 MALONEY DILATION (N/A) - 8:30 am, asa 2 as a surgical intervention.  The patient's history has been reviewed, patient examined, no change in status, stable for surgery.  I have reviewed the patient's chart and labs.  Questions were answered to the patient's satisfaction.       Elmo Shumard  No change.  EGD with esophageal dilation as feasible/appropriate plus duodenal biopsies to further evaluate GI symptoms. The risks, benefits, limitations, alternatives and imponderables have been reviewed with the patient. Potential for esophageal dilation, biopsy, etc. have also been reviewed.  Questions have been answered. All parties agreeable.

## 2023-10-30 NOTE — Op Note (Signed)
 [facility] Patient Name: Tonya Love Procedure Date: 10/30/2023 7:14 AM MRN: [id number] Date of Birth: Oct 17, 1975 Attending MD: Gemma Kelp , MD, [id number] CSN: [id number] Age: 48 Admit Type: Outpatient Procedure:                Upper GI endoscopy Indications:              Dysphagia Providers:                Gemma Kelp, MD, Karyle Pagoda, RN, Rhodell Butter, Technician Referring MD:              Medicines:                Propofol  per Anesthesia Complications:            No immediate complications. Estimated Blood Loss:     Estimated blood loss was minimal. Estimated blood                            loss was minimal. Estimated blood loss was minimal. Procedure:                Pre-Anesthesia Assessment:                           - Prior to the procedure, a History and Physical                            was performed, and patient medications and                            allergies were reviewed. The patient's tolerance of                            previous anesthesia was also reviewed. The risks                            and benefits of the procedure and the sedation                            options and risks were discussed with the patient.                            All questions were answered, and informed consent                            was obtained. Prior Anticoagulants: The patient has                            taken no anticoagulant or antiplatelet agents. ASA                            Grade Assessment: II - A patient with mild systemic  disease. After reviewing the risks and benefits,                            the patient was deemed in satisfactory condition to                            undergo the procedure.                           After obtaining informed consent, the endoscope was                            passed under direct vision. Throughout the                             procedure, the patient's blood pressure, pulse, and                            oxygen saturations were monitored continuously. The                            [id number]-H190 ([id number]) scope was introduced through the                            mouth, and advanced to the second part of duodenum.                            The upper GI endoscopy was accomplished without                            difficulty. The patient tolerated the procedure                            well. Scope In: 7:42:26 AM Scope Out: 7:49:08 AM Total Procedure Duration: 0 hours 6 minutes 42 seconds  Findings:      A mild Schatzki ring was found at the gastroesophageal junction. Single       3 cm length area of linear erythema extending from the GE junction       proximally. No mass. No Barrett's epithelium.      A small hiatal hernia was present. Stomach appeared normal otherwise.      The duodenal bulb, second portion of the duodenum and third portion of       the duodenum were normal. This was biopsied with a cold forceps for       histology. Estimated blood loss was minimal. The scope was withdrawn.       Dilation was performed with a Maloney dilator with no resistance at 54       Fr. Dilation was performed with a Maloney dilator with no resistance at       56 Fr. Dilation was performed with a Maloney dilator with mild       resistance at 58 Fr. The dilation site was examined following endoscope       reinsertion and showed no change. Estimated blood loss: none. Impression:               -  Mild Schatzki ring. Dilated.                           - Small hiatal hernia.                           - Normal duodenal bulb, second portion of the                            duodenum and third portion of the duodenum.                            Biopsied. Moderate Sedation:      Moderate (conscious) sedation was personally administered by an       anesthesia professional. The following parameters were monitored: oxygen        saturation, heart rate, blood pressure, respiratory rate, EKG, adequacy       of pulmonary ventilation, and response to care. Recommendation:           - Patient has a contact number available for                            emergencies. The signs and symptoms of potential                            delayed complications were discussed with the                            patient. Return to normal activities tomorrow.                            Written discharge instructions were provided to the                            patient.                           - Advance diet as tolerated.                           - Continue present medications. Follow-up on                            pathology.                           - Return to my office in 6 weeks. Procedure Code(s):        --- Professional ---                           [id number], Esophagogastroduodenoscopy, flexible,                            transoral; with biopsy, single or multiple                           43450, Dilation of esophagus, by unguided  sound or                            bougie, single or multiple passes Diagnosis Code(s):        --- Professional ---                           K22.2, Esophageal obstruction                           K44.9, Diaphragmatic hernia without obstruction or                            gangrene                           R13.10, Dysphagia, unspecified CPT copyright 2022 American Medical Association. All rights reserved. The codes documented in this report are preliminary and upon coder review may  be revised to meet current compliance requirements. Windsor Hatcher. Arlesia Kiel, MD Gemma Kelp, MD 10/30/2023 8:02:13 AM This report has been signed electronically. Number of Addenda: 0

## 2023-10-31 ENCOUNTER — Encounter (HOSPITAL_COMMUNITY): Payer: Self-pay | Admitting: Internal Medicine

## 2023-10-31 LAB — SURGICAL PATHOLOGY

## 2023-11-01 ENCOUNTER — Encounter: Payer: Self-pay | Admitting: Internal Medicine

## 2023-11-09 ENCOUNTER — Encounter: Payer: Self-pay | Admitting: Adult Health

## 2023-11-09 ENCOUNTER — Ambulatory Visit: Payer: BC Managed Care – PPO | Admitting: Adult Health

## 2023-11-09 VITALS — BP 112/71 | HR 78 | Ht 62.0 in | Wt 189.0 lb

## 2023-11-09 DIAGNOSIS — G43709 Chronic migraine without aura, not intractable, without status migrainosus: Secondary | ICD-10-CM | POA: Diagnosis not present

## 2023-11-09 MED ORDER — AJOVY 225 MG/1.5ML ~~LOC~~ SOAJ: SUBCUTANEOUS | 3 refills | Status: DC

## 2023-11-09 MED ORDER — GABAPENTIN 100 MG PO CAPS
100.0000 mg | ORAL_CAPSULE | Freq: Two times a day (BID) | ORAL | 3 refills | Status: AC
Start: 2023-11-09 — End: ?

## 2023-11-09 MED ORDER — RIZATRIPTAN BENZOATE 10 MG PO TABS: ORAL_TABLET | ORAL | 11 refills | Status: DC

## 2023-11-09 NOTE — Progress Notes (Signed)
PATIENT: Tonya Love DOB: Jul 23, 1976  REASON FOR VISIT: follow up HISTORY FROM: patient Primary neurologist: Dr. Frances Furbish Chief Complaint  Patient presents with   RM 20    Patient is here alone for migraine follow-up. Patient states she has been doing pretty well. Every once in awhile she gets a little headache. She thinks it might be stress related. She is on Ajovy and Gabapentin. Takes Maxalt and zofran prn.     HISTORY OF PRESENT ILLNESS: Today 11/09/23:  Tonya Love is a 48 y.o. female with a history of migraine headaches. Returns today for follow-up.  She is doing well on Ajovy and gabapentin.  She states that she typically only gets headaches when her next injection is due.  Continues on rizatriptan.  Reports that it typically works well.  10/20/22: Tonya Love is a 48 y.o. female with a history of Migraine headaches. Returns today for follow-up.  Overall she feels that she is doing well.  Continues on Ajovy and gabapentin for migraines.  She states occasionally she will get a stress headache.  Reports that she consistently takes over-the-counter medication with good benefit.  In the past she has been on Maxalt but states her prescription ran out so she has not used this in a while.  10/27/21: Tonya Love is a 48 year old female with a history of migraine headaches. She returns today for follow-up. She is currently taking Ajovy and gabapentin.  She states with the addition of gabapentin she is only had 1 headache this month.  States that she is not having to use Maxalt.  Denies any new symptoms.  Returns today for an evaluation.  04/20/21:Tonya Love is a 48 year old female with a history of migraine headaches.  She continues on Ajovy and Maxalt.  She reports that she is 2 weeks behind on Ajovy due to prior authorization.  She states that she is having sharp shooting pain in the temporal regions and across the forehead.  She reports some blurry vision when she has these  episodes.  Reports that they are very brief and only last for seconds.  This is only been occurring in the last month.   05/05/20: Tonya Love is a 48 year old female with a history of migraine. She returns today for follow-up. She remains on Ajovy.  She reports that this controls her headache.  She typically gets headaches the week before the next dose is due.  She states that her headaches typically occur across the forehead.  She does have photophobia and phonophobia.  On occasion she will have nausea.  She reports that Maxalt continues to give her good benefit.  On occasion she will have to take a second dose.  HISTORY 11/04/2019: She reports that the Maxalt helps, some days better than others, she needs a prescription for Zofran as she is out.  Her migraine frequency has increased a little bit, she can have 2 or 3 migraines per week, they can last 24 to 36 hours.  She has not been on preventative medication in the past but has tried as needed medication in the past.  She has had some intermittent palpitations and chest discomfort, also jitteriness.  This is not after taking Maxalt actually.  It can be when she is just sitting and she feels unwell, she has noticed on her health tracker watch that her pulse rate can go up to the 150s, yesterday she had a similar episode in her pulse rate was 158.  She has not seen her primary care physician or nurse practitioner yet for this.  She has not seen a cardiologist in the past.  She has not seen a correlation between her caffeine intake and her symptoms and also she had not skipped any meals.  She tries to hydrate well with water, drinks caffeine in the form of [facility] occasionally, not every day, does not like coffee.   The patient's allergies, current medications, family history, past medical history, past social history, past surgical history and problem list were reviewed and updated as appropriate.      REVIEW OF SYSTEMS: Out of a complete 14 system  review of symptoms, the patient complains only of the following symptoms, and all other reviewed systems are negative.  See HPI  ALLERGIES: Allergies  Allergen Reactions   Codeine Nausea Only    HOME MEDICATIONS: Outpatient Medications Prior to Visit  Medication Sig Dispense Refill   cholestyramine (QUESTRAN) 4 g packet Take 1 packet (4 g total) by mouth daily before lunch. 90 each 1   ferrous sulfate 325 (65 FE) MG tablet Take 325 mg by mouth daily with breakfast.     Fremanezumab-vfrm (AJOVY) 225 MG/1.5ML SOAJ INJECT 1 PEN INTO THE SKIN EVERY 30 DAYS 4.5 mL 2   gabapentin (NEURONTIN) 100 MG capsule TAKE 1 CAPSULE(100 MG) BY MOUTH TWICE DAILY 180 capsule 1   ondansetron (ZOFRAN) 4 MG tablet Take 1 tablet (4 mg total) by mouth every 8 (eight) hours as needed for nausea or vomiting. 30 tablet 0   pantoprazole (PROTONIX) 40 MG tablet Take 1 tablet (40 mg total) by mouth 2 (two) times daily. 180 tablet 1   rizatriptan (MAXALT) 10 MG tablet Take 1 tablet at the onset of migraine. May repeat in 2 hours if needed 10 tablet 11   indomethacin (INDOCIN) 25 MG capsule TAKE 1 CAPSULE BY MOUTH DAILY AT 12 PM (Patient not taking: Reported on 11/09/2023) 42 capsule 0   No facility-administered medications prior to visit.    PAST MEDICAL HISTORY: Past Medical History:  Diagnosis Date   Anxiety    Depression    GERD (gastroesophageal reflux disease)    Helicobacter pylori gastritis 10/2021   Treated with generic Prevpac, then treated with levofloxacin, amoxicillin, and PPI BID (July 2023)   Migraines    Pancreatitis    Sleep apnea     PAST SURGICAL HISTORY: Past Surgical History:  Procedure Laterality Date   BIOPSY  11/06/2019   Procedure: BIOPSY;  Surgeon: Corbin Ade, MD;  [facility];  Service: Endoscopy;;   BIOPSY  11/12/2021   Procedure: BIOPSY;  Surgeon: Lanelle Bal, DO;  [facility];  Service: Endoscopy;;   BIOPSY  10/30/2023   Procedure: BIOPSY;   Surgeon: Corbin Ade, MD;  [facility];  Service: Endoscopy;;   CHOLECYSTECTOMY     COLONOSCOPY N/A 11/06/2019   Procedure: COLONOSCOPY;  Surgeon: Corbin Ade, diverticulosis in the sigmoid colon, minimal grade 2 hemorrhoids, otherwise normal.  Segmental biopsies benign.  Repeat colonoscopy in 10 years.   ESOPHAGOGASTRODUODENOSCOPY N/A 11/06/2019   Procedure: ESOPHAGOGASTRODUODENOSCOPY (EGD);  Surgeon: Corbin Ade, MD; mild Schatzki's ring s/p dilation, abnormal distal esophageal mucosa with benign biopsies, small hiatal hernia, otherwise normal.   ESOPHAGOGASTRODUODENOSCOPY (EGD) WITH PROPOFOL N/A 11/12/2021   Surgeon: Lanelle Bal, DO; small hiatal hernia, mild Schatzki's ring dilated, esophageal biopsies with reflux changes, H. pylori gastritis.  Treated with Prevpac.   ESOPHAGOGASTRODUODENOSCOPY (EGD)  WITH PROPOFOL N/A 10/30/2023   Procedure: ESOPHAGOGASTRODUODENOSCOPY (EGD) WITH PROPOFOL;  Surgeon: Corbin Ade, MD;  [facility];  Service: Endoscopy;  Laterality: N/A;  8:30 am, asa 2   MALONEY DILATION N/A 11/06/2019   Procedure: MALONEY DILATION;  Surgeon: Corbin Ade, MD;  [facility];  Service: Endoscopy;  Laterality: N/A;   MALONEY DILATION N/A 10/30/2023   Procedure: Elease Hashimoto DILATION;  Surgeon: Corbin Ade, MD;  [facility];  Service: Endoscopy;  Laterality: N/A;  8:30 am, asa 2   TUBAL LIGATION      FAMILY HISTORY: Family History  Problem Relation Age of Onset   Hypertension Mother    Cancer Mother    Alcohol abuse Father    Alcohol abuse Paternal Grandfather    Depression Maternal Aunt    Depression Paternal Aunt    Colon cancer Neg Hx    Pancreatic cancer Neg Hx     SOCIAL HISTORY: Social History   Socioeconomic History   Marital status: Single    Spouse name: Not on file   Number of children: Not on file   Years of education: Not on file   Highest education level: Not on file  Occupational  History   Not on file  Tobacco Use   Smoking status: Never    Passive exposure: Past   Smokeless tobacco: Never  Vaping Use   Vaping status: Never Used  Substance and Sexual Activity   Alcohol use: No   Drug use: No   Sexual activity: Not Currently  Other Topics Concern   Not on file  Social History Narrative   Grown son lives with her   Right handed   Caffeine: 1 soda/day   Social Drivers of Corporate investment banker Strain: Low Risk  (06/26/2018)   [facility]   Overall Financial Resource Strain (CARDIA)    Difficulty of Paying Living Expenses: Not hard at all  Food Insecurity: Unknown (06/27/2023)   Received from [facility]   Hunger Vital Sign    Worried About Running Out of Food in the Last Year: Not on file    Ran Out of Food in the Last Year: Never true  Transportation Needs: No Transportation Needs (06/27/2023)   Received from [facility]   PRAPARE - Transportation    Lack of Transportation (Medical): No    Lack of Transportation (Non-Medical): No  Physical Activity: Inactive (06/26/2018)   Received [facility], [facility]   Exercise Vital Sign    Days of Exercise per Week: 0 days    Minutes of Exercise per Session: 0 min  Stress: Not on file  Social Connections: Not on file  Intimate Partner Violence: Not At Risk (06/27/2023)   Received from [facility]   Humiliation, Afraid, Rape, and Kick questionnaire    Fear of Current or Ex-Partner: No    Emotionally Abused: No    Physically Abused: No    Sexually Abused: No      PHYSICAL EXAM  Vitals:   11/09/23 1440  BP: 112/71  Pulse: 78  Weight: 189 lb (85.7 kg)  Height: 5\' 2"  (1.575 m)     Body mass index is 34.57 kg/m.  Generalized: Well developed, in no acute distress   Neurological examination  Mentation: Alert oriented to time, place, history taking. Follows all commands speech and language fluent Cranial nerve II-XII: Pupils were  equal round  reactive to light. Extraocular movements were full, visual field were full on confrontational test.. Head turning and shoulder shrug  were normal and symmetric. Motor: The motor testing reveals 5 over 5 strength of all 4 extremities. Good symmetric motor tone is noted throughout.  Sensory: Sensory testing is intact to soft touch on all 4 extremities. No evidence of extinction is noted.  Coordination: Cerebellar testing reveals good finger-nose-finger and heel-to-shin bilaterally.  Gait and station: Gait is normal.  Reflexes: Deep tendon reflexes are symmetric and normal bilaterally.   DIAGNOSTIC DATA (LABS, IMAGING, TESTING) - I reviewed patient records, labs, notes, testing and imaging myself where available.  Lab Results  Component Value Date   WBC 6.6 03/15/2022   HGB 10.6 (L) 03/15/2022   HCT 32.4 (L) 03/15/2022   MCV 91.0 03/15/2022   PLT 334 03/15/2022      Component Value Date/Time   NA 141 09/23/2021 1403   NA 140 08/05/2019 1215   K 3.7 09/23/2021 1403   CL 106 09/23/2021 1403   CO2 26 09/23/2021 1403   GLUCOSE 88 09/23/2021 1403   BUN 10 09/23/2021 1403   BUN 7 08/05/2019 1215   CREATININE 0.85 09/23/2021 1403   CALCIUM 9.4 09/23/2021 1403   PROT 6.6 09/23/2021 1403   PROT 7.0 08/05/2019 1215   ALBUMIN 4.3 08/05/2019 1215   AST 15 09/23/2021 1403   ALT 10 09/23/2021 1403   ALKPHOS 72 08/05/2019 1215   BILITOT 0.3 09/23/2021 1403   BILITOT 0.3 08/05/2019 1215   GFRNONAA >60 06/30/2021 1457   GFRNONAA 73 08/26/2019 1551   GFRAA 85 08/26/2019 1551    Lab Results  Component Value Date   TSH 0.815 06/30/2021      ASSESSMENT AND PLAN 48 y.o. year old female  has a past medical history of Anxiety, Depression, GERD (gastroesophageal reflux disease), Helicobacter pylori gastritis (10/2021), Migraines, Pancreatitis, and Sleep apnea. here with:  1.  Migraine headaches  Continue Ajovy Continue gabapentin 100 mg BID Continue Maxalt for abortive  therapy Advised if headaches worsen or she develops new symptoms she should let us know. Follow-up in 1 year or sooner if needed    [name], MSN, NP-C 11/09/2023, 2:42 PM [facility] [address], [address] [place], [place] [address] [phone]

## 2023-11-09 NOTE — Patient Instructions (Signed)
Continue Ajovy Continue gabapentin 100 mg BID Continue Maxalt for abortive therapy

## 2023-11-20 ENCOUNTER — Ambulatory Visit: Payer: BC Managed Care – PPO | Admitting: Adult Health

## 2023-12-10 NOTE — Progress Notes (Unsigned)
 Referring Provider: No ref. provider found Primary Care Physician:  Pcp, No Primary GI Physician: Dr. Jena Gauss  No chief complaint on file.   HPI:   Tonya Love is a 48 y.o. female with GI history of GERD, dysphagia secondary to Schatzki's ring s/p dilations, H. 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  Also with mild anemia suspected to be secondary to menorrhagia.  She is presenting today for follow-up of dysphagia, nausea, and flatulence.  Last seen in the office 10/06/2023.  GERD well-controlled on pantoprazole 40 mg twice daily, but she had recurrent solid food dysphagia occurring few times a week.  Diarrhea fairly well-controlled on Questran 4 g before lunch typically having 1 BM per day, but 2 or 3/day few days a week.  No alarm symptoms.  Had some increased gas for the last month.  Also noted some recurrent nausea over the last week typically in the afternoon between 3 and 5 PM.  No vomiting.  No heartburn.  No abdominal pain.  Has not required Zofran.  No specific trigger.  Noted she was going to be starting her menstrual cycle in the next few days and has been taking ibuprofen a couple times a week recently for tooth pain as well.  Suspected this may be contributing.  Recommended EGD with possible dilation, continuing current medications, daily probiotic, lactose-free diet or Lactaid tablets, avoid common gas producing items, follow-up after EGD.   EGD 10/30/2023: Mild Schatzki's ring dilated, small hiatal hernia, normal examined duodenum biopsy.  Duodenal biopsies benign.   Today: Dysphagia:  Nausea:  Flatulence/gas:     Prior GI Work-up:  Colonoscopy Feb 2021 with  diverticulosis in sigmoid colon, minimal grade 2 hemorrhoids, and benign random colon biopsies with recommendations to repeat in 10 years.     EGD Feb 2021 with mild Schatzki ring s/p dilation, abnormal distal esophageal mucosa with benign biopsy, small hiatal hernia, otherwise normal exam.    GES wnl in April 2021.     Celiac serologies negative.   CT A/P with contrast 05/08/2020 with no significant findings.      CT A/P with contrast completed 09/28/2021 with no acute findings.   EGD 11/12/2021 revealing Schatzki's ring dilated, small hiatal hernia, erythematous mucosa in the gastric antrum. Esophageal biopsies revealed reactive squamous mucosa with increased intraepithelial lymphocytes and rare eosinophils. Gastric biopsy with chronic gastritis with H. pylori.    Gastric emptying study 06/30/2022: Normal.  H pylori breath test negative November 2023   Past Medical History:  Diagnosis Date   Anxiety    Depression    GERD (gastroesophageal reflux disease)    Helicobacter pylori gastritis 10/2021   Treated with generic Prevpac, then treated with levofloxacin, amoxicillin, and PPI BID (July 2023)   Migraines    Pancreatitis    Sleep apnea     Past Surgical History:  Procedure Laterality Date   BIOPSY  11/06/2019   Procedure: BIOPSY;  Surgeon: Corbin Ade, MD;  [facility];  Service: Endoscopy;;   BIOPSY  11/12/2021   Procedure: BIOPSY;  Surgeon: Lanelle Bal, DO;  [facility];  Service: Endoscopy;;   BIOPSY  10/30/2023   Procedure: BIOPSY;  Surgeon: Corbin Ade, MD;  [facility];  Service:  Endoscopy;;   CHOLECYSTECTOMY     COLONOSCOPY N/A 11/06/2019   Procedure: COLONOSCOPY;  Surgeon: Corbin Ade, diverticulosis in the sigmoid colon, minimal grade 2 hemorrhoids, otherwise normal.  Segmental biopsies benign.  Repeat colonoscopy in 10 years.   ESOPHAGOGASTRODUODENOSCOPY N/A 11/06/2019   Procedure: ESOPHAGOGASTRODUODENOSCOPY  (EGD);  Surgeon: Corbin Ade, MD; mild Schatzki's ring s/p dilation, abnormal distal esophageal mucosa with benign biopsies, small hiatal hernia, otherwise normal.   ESOPHAGOGASTRODUODENOSCOPY (EGD) WITH PROPOFOL N/A 11/12/2021   Surgeon: Lanelle Bal, DO; small hiatal hernia, mild Schatzki's ring dilated, esophageal biopsies with reflux changes, H. pylori gastritis.  Treated with Prevpac.   ESOPHAGOGASTRODUODENOSCOPY (EGD) WITH PROPOFOL N/A 10/30/2023   Procedure: ESOPHAGOGASTRODUODENOSCOPY (EGD) WITH PROPOFOL;  Surgeon: Corbin Ade, MD;  [facility];  Service: Endoscopy;  Laterality: N/A;  8:30 am, asa 2   MALONEY DILATION N/A 11/06/2019   Procedure: MALONEY DILATION;  Surgeon: Corbin Ade, MD;  [facility];  Service: Endoscopy;  Laterality: N/A;   MALONEY DILATION N/A 10/30/2023   Procedure: Elease Hashimoto DILATION;  Surgeon: Corbin Ade, MD;  [facility];  Service: Endoscopy;  Laterality: N/A;  8:30 am, asa 2   TUBAL LIGATION      Current Outpatient Medications  Medication Sig Dispense Refill   cholestyramine (QUESTRAN) 4 g packet Take 1 packet (4 g total) by mouth daily before lunch. 90 each 1   ferrous sulfate 325 (65 FE) MG tablet Take 325 mg by mouth daily with breakfast.     Fremanezumab-vfrm (AJOVY) 225 MG/1.5ML SOAJ INJECT 1 PEN INTO THE SKIN EVERY 30 DAYS 4.5 mL 3   gabapentin (NEURONTIN) 100 MG capsule Take 1 capsule (100 mg total) by mouth 2 (two) times daily. TAKE 1 CAPSULE(100 MG) BY MOUTH TWICE DAILY 180 capsule 3   ondansetron (ZOFRAN) 4 MG tablet Take 1 tablet (4 mg total) by mouth every 8 (eight) hours as needed for nausea or vomiting. 30 tablet 0   pantoprazole (PROTONIX) 40 MG tablet Take 1 tablet (40 mg total) by mouth 2 (two) times daily. 180 tablet 1   rizatriptan (MAXALT) 10 MG tablet Take 1 tablet at the onset of migraine. May repeat in 2 hours if needed 10 tablet 11   No current facility-administered medications for  this visit.    Allergies as of 12/11/2023 - Review Complete 11/09/2023  Allergen Reaction Noted   Codeine Nausea Only 12/25/2014    Family History  Problem Relation Age of Onset   Hypertension Mother    Cancer Mother    Alcohol abuse Father    Alcohol abuse Paternal Grandfather    Depression Maternal Aunt    Depression Paternal Aunt    Colon cancer Neg Hx    Pancreatic cancer Neg Hx     Social History   Socioeconomic History   Marital status: Single    Spouse name: Not on file   Number of children: Not on file   Years of education: Not on file   Highest education level: Not on file  Occupational History   Not on file  Tobacco Use   Smoking status: Never    Passive exposure: Past   Smokeless tobacco: Never  Vaping Use   Vaping status: Never Used  Substance and Sexual Activity   Alcohol use: No   Drug use: No   Sexual activity: Not Currently  Other Topics Concern   Not on file  Social History Narrative   Grown son lives  with her   Right handed   Caffeine: 1 soda/day   Social Drivers of Corporate investment banker Strain: Low Risk  (06/26/2018)   Received [facility], [facility]   Overall Financial Resource Strain (CARDIA)    Difficulty of Paying Living Expenses: Not hard at all  Food Insecurity: Unknown (06/27/2023)   Received from [facility]   Hunger Vital Sign    Worried About Running Out of Food in the Last Year: Not on file    Ran Out of Food in the Last Year: Never true  Transportation Needs: No Transportation Needs (06/27/2023)   Received from [facility] - Transportation    Lack of Transportation (Medical): No    Lack of Transportation (Non-Medical): No  Physical Activity: Inactive (06/26/2018)   Received from [facility], [facility]   Exercise Vital Sign    Days of Exercise per Week: 0 days    Minutes of Exercise per Session: 0 min  Stress: Not on file  Social Connections: Not on file    Review of  Systems: Gen: Denies fever, chills, anorexia. Denies fatigue, weakness, weight loss.  CV: Denies chest pain, palpitations, syncope, peripheral edema, and claudication. Resp: Denies dyspnea at rest, cough, wheezing, coughing up blood, and pleurisy. GI: Denies vomiting blood, jaundice, and fecal incontinence.   Denies dysphagia or odynophagia. Derm: Denies rash, itching, dry skin Psych: Denies depression, anxiety, memory loss, confusion. No homicidal or suicidal ideation.  Heme: Denies bruising, bleeding, and enlarged lymph nodes.  Physical Exam: There were no vitals taken for this visit. General:   Alert and oriented. No distress noted. Pleasant and cooperative.  Head:  Normocephalic and atraumatic. Eyes:  Conjuctiva clear without scleral icterus. Heart:  S1, S2 present without murmurs appreciated. Lungs:  Clear to auscultation bilaterally. No wheezes, rales, or rhonchi. No distress.  Abdomen:  +BS, soft, non-tender and non-distended. No rebound or guarding. No HSM or masses noted. Msk:  Symmetrical without gross deformities. Normal posture. Extremities:  Without edema. Neurologic:  Alert and  oriented x4 Psych:  Normal mood and affect.    Assessment:     Plan:  ***   Ermalinda Memos, PA-C [facility] 12/11/2023

## 2023-12-11 ENCOUNTER — Encounter: Payer: Self-pay | Admitting: Gastroenterology

## 2023-12-11 ENCOUNTER — Ambulatory Visit (INDEPENDENT_AMBULATORY_CARE_PROVIDER_SITE_OTHER): Payer: BC Managed Care – PPO | Admitting: Gastroenterology

## 2023-12-11 VITALS — BP 110/77 | HR 108 | Temp 98.0°F | Ht 62.0 in | Wt 188.6 lb

## 2023-12-11 DIAGNOSIS — R143 Flatulence: Secondary | ICD-10-CM

## 2023-12-11 DIAGNOSIS — Z8719 Personal history of other diseases of the digestive system: Secondary | ICD-10-CM

## 2023-12-11 DIAGNOSIS — R1310 Dysphagia, unspecified: Secondary | ICD-10-CM

## 2023-12-11 DIAGNOSIS — K219 Gastro-esophageal reflux disease without esophagitis: Secondary | ICD-10-CM | POA: Diagnosis not present

## 2023-12-11 DIAGNOSIS — K529 Noninfective gastroenteritis and colitis, unspecified: Secondary | ICD-10-CM | POA: Diagnosis not present

## 2023-12-11 DIAGNOSIS — K580 Irritable bowel syndrome with diarrhea: Secondary | ICD-10-CM

## 2023-12-11 MED ORDER — RIFAXIMIN 550 MG PO TABS
550.0000 mg | ORAL_TABLET | Freq: Three times a day (TID) | ORAL | 0 refills | Status: AC
Start: 2023-12-11 — End: 2023-12-25

## 2023-12-11 NOTE — Patient Instructions (Signed)
 I would like to try you on a course of Xifaxan 550 mg 3 times a day for 14 days for chronic diarrhea and increased gas/bloating secondary to IBS or possibly SIBO.  I am sending a prescription to your pharmacy.  If you have any trouble obtaining this medication, please let me know.  You may continue to take Questran 4 g daily for now.  Continue pantoprazole 40 mg twice daily 30 minutes before breakfast and dinner.  If you continue to have breakthrough heartburn symptoms, please let me know and we will change her medication.  Follow a GERD diet:  Avoid fried, fatty, greasy, spicy, citrus foods. Avoid caffeine and carbonated beverages. Avoid chocolate. Try eating 4-6 small meals a day rather than 3 large meals. Do not eat within 3 hours of laying down. Prop head of bed up on wood or bricks to create a 6 inch incline.   I will plan to see you back in 3 months or sooner if needed.  Ermalinda Memos, PA-C [facility]

## 2023-12-12 ENCOUNTER — Ambulatory Visit
Admission: RE | Admit: 2023-12-12 | Discharge: 2023-12-12 | Disposition: A | Discharge status: Home / Self Care | Source: Ambulatory Visit | Attending: Nurse Practitioner | Admitting: Nurse Practitioner

## 2023-12-12 ENCOUNTER — Other Ambulatory Visit: Payer: Self-pay

## 2023-12-12 ENCOUNTER — Telehealth: Payer: Self-pay | Admitting: Cardiology

## 2023-12-12 VITALS — BP 121/77 | HR 92 | Temp 98.3°F | Resp 18

## 2023-12-12 DIAGNOSIS — R42 Dizziness and giddiness: Secondary | ICD-10-CM | POA: Diagnosis not present

## 2023-12-12 DIAGNOSIS — R002 Palpitations: Secondary | ICD-10-CM

## 2023-12-12 DIAGNOSIS — Z79899 Other long term (current) drug therapy: Secondary | ICD-10-CM | POA: Insufficient documentation

## 2023-12-12 LAB — POCT URINALYSIS DIP (MANUAL ENTRY)
Bilirubin, UA: NEGATIVE
Blood, UA: NEGATIVE
Glucose, UA: NEGATIVE mg/dL
Ketones, POC UA: NEGATIVE mg/dL
Nitrite, UA: NEGATIVE
Protein Ur, POC: NEGATIVE mg/dL
Spec Grav, UA: 1.02
Urobilinogen, UA: 0.2 U/dL
pH, UA: 7

## 2023-12-12 MED ORDER — MECLIZINE HCL 12.5 MG PO TABS: 12.5000 mg | ORAL_TABLET | Freq: Three times a day (TID) | ORAL | 0 refills | Status: DC | PRN

## 2023-12-12 NOTE — Telephone Encounter (Signed)
 Patient identification verified by 2 forms. Shade Flood, RN     Called and spoke to patient  Patient states:  - She has been experiencing dizziness since Saturday.  - Notes she has headache that will come and go  - BP  Bp 110/77 HR 108 around 1:30 today.         Patient denies:    - SOB, chest pain, vision changes, difficulty with speech, one sided weakness   Interventions/Plan: - Patient scheduled for OV with DOD on 4/8   Reviewed ED warning signs/precautions  Patient agrees with plan, no questions at this time

## 2023-12-12 NOTE — Discharge Instructions (Addendum)
 Metabolic panel, CBC, and urine culture are pending.  Your EKG was negative.  You will be contacted if the pending test results are abnormal.  You also have access to results via MyChart. Take medication as prescribed.  Please be advised medication may cause drowsiness.  No driving, operating heavy equipment, or drinking alcohol when taking the medication. Avoid sudden movement while symptoms persist. Make sure you are drinking plenty of fluids. If your lab work is normal, and you are continuing to experience symptoms, it is recommended that you follow-up with your cardiologist for further evaluation. Go to the emergency department immediately if you experience worsening dizziness, feeling as if you are going to pass out, chest pain, shortness of breath, or other concerns. I have provided information for a local primary care office in the area who is excepting patients.  Please contact their office to schedule an appointment to establish care. Follow-up as needed.

## 2023-12-12 NOTE — ED Triage Notes (Signed)
 Feels dizzy when standing up with nausea and headache x 3 weeks.  States symptoms come and go.

## 2023-12-12 NOTE — Telephone Encounter (Signed)
 STAT if patient feels like he/she is going to faint   Are you dizzy, lightheaded, or faint now? Yes  Have you passed out? No, but feels like she's going to. IF YES MOVE TO .SYNCOPECVD  Do you have any other symptoms? Headache   Have you checked your HR and BP (record if available)? Bp 110/77 HR 108 around 1:30 today.

## 2023-12-12 NOTE — ED Provider Notes (Signed)
 [facility]    CSN: [id number] Arrival date & time: 12/12/23  1318      History   Chief Complaint Chief Complaint  Patient presents with   Dizziness    Feel like I'm going pass out - Entered by patient    HPI Tonya Love is a 48 y.o. female.   The history is provided by the patient.   Patient presents for complaints of dizziness, and feeling as if she is going to pass out for the past 3 weeks.  Patient states she has at least 1 episode where has had at least 1 episode daily since that time.  States that 3 days ago, she had an episode that lasted for several hours.  States that she just lays in the bed until the symptoms resolved.  Reports that she does have palpitations when the symptoms occur from time to time.  She states that she has also experienced nausea and headache with her symptoms.  She denies fever, chills, recent upper respiratory symptoms, blurred vision, chest pain, abdominal pain, nausea, vomiting, diarrhea, or rash.  Patient denies prior history of vertigo.  Patient with history of palpitations, states that she does see cardiology.  Past Medical History:  Diagnosis Date   Anxiety    Chronic diarrhea    Depression    GERD (gastroesophageal reflux disease)    Helicobacter pylori gastritis 10/2021   Treated with generic Prevpac, then treated with levofloxacin, amoxicillin, and PPI BID (July 2023)   Migraines    Pancreatitis    Sleep apnea     Patient Active Problem List   Diagnosis Date Noted   Chronic diarrhea 06/24/2022   H. pylori infection 03/18/2022   Helicobacter pylori gastritis 02/24/2022   Right tennis elbow 02/23/2022   Mild sleep apnea 12/13/2021   Obesity 12/13/2021   Left flank pain 09/03/2021   Early satiety 12/18/2019   Dysphagia 08/27/2019   Anemia 08/26/2019   Diarrhea 08/26/2019   Gastroesophageal reflux disease 08/26/2019   Nausea without vomiting 08/26/2019   History of diverticulitis 08/26/2019   Rectal bleeding  08/26/2019   Anxiety state 02/11/2014    Past Surgical History:  Procedure Laterality Date   BIOPSY  11/06/2019   Procedure: BIOPSY;  Surgeon: Corbin Ade, MD;  [facility];  Service: Endoscopy;;   BIOPSY  11/12/2021   Procedure: BIOPSY;  Surgeon: Lanelle Bal, DO;  [facility];  Service: Endoscopy;;   BIOPSY  10/30/2023   Procedure: BIOPSY;  Surgeon: Corbin Ade, MD;  [facility];  Service: Endoscopy;;   CHOLECYSTECTOMY     COLONOSCOPY N/A 11/06/2019   Procedure: COLONOSCOPY;  Surgeon: Corbin Ade, diverticulosis in the sigmoid colon, minimal grade 2 hemorrhoids, otherwise normal.  Segmental biopsies benign.  Repeat colonoscopy in 10 years.   ESOPHAGOGASTRODUODENOSCOPY N/A 11/06/2019   Procedure: ESOPHAGOGASTRODUODENOSCOPY (EGD);  Surgeon: Corbin Ade, MD; mild Schatzki's ring s/p dilation, abnormal distal esophageal mucosa with benign biopsies, small hiatal hernia, otherwise normal.   ESOPHAGOGASTRODUODENOSCOPY (EGD) WITH PROPOFOL N/A 11/12/2021   Surgeon: Lanelle Bal, DO; small hiatal hernia, mild Schatzki's ring dilated, esophageal biopsies with reflux changes, H. pylori gastritis.  Treated with Prevpac.   ESOPHAGOGASTRODUODENOSCOPY (EGD) WITH PROPOFOL N/A 10/30/2023   Surgeon: Corbin Ade, MD; Mild Schatzki's ring dilated, small hiatal hernia, normal examined duodenum biopsy.  Duodenal biopsies benign.   MALONEY DILATION N/A 11/06/2019   Procedure: Elease Hashimoto DILATION;  Surgeon: Corbin Ade, MD;  [facility];  Service: Endoscopy;  Laterality: N/A;   MALONEY DILATION N/A 10/30/2023   Procedure: Elease Hashimoto DILATION;  Surgeon: Corbin Ade, MD;  [facility];  Service: Endoscopy;  Laterality: N/A;  8:30 am, asa 2   TUBAL LIGATION      OB History     Gravida      Para      Term      Preterm      AB      Living  2      SAB      IAB      Ectopic      Multiple      Live Births                Home Medications    Prior to Admission medications   Medication Sig Start Date End Date Taking? Authorizing Provider  cholestyramine (QUESTRAN) 4 g packet Take 1 packet (4 g total) by mouth daily before lunch. 04/06/23   [name], PA-C  ferrous sulfate 325 (65 FE) MG tablet Take 325 mg by mouth daily with breakfast.    [provider]  Fremanezumab-vfrm (AJOVY) 225 MG/1.5ML SOAJ INJECT 1 PEN INTO THE SKIN EVERY 30 DAYS 11/09/23   [name], NP  gabapentin (NEURONTIN) 100 MG capsule Take 1 capsule (100 mg total) by mouth 2 (two) times daily. TAKE 1 CAPSULE(100 MG) BY MOUTH TWICE DAILY 11/09/23   [name], NP  ondansetron (ZOFRAN) 4 MG tablet Take 1 tablet (4 mg total) by mouth every 8 (eight) hours as needed for nausea or vomiting. 10/06/23   [name], PA-C  pantoprazole (PROTONIX) 40 MG tablet Take 1 tablet (40 mg total) by mouth 2 (two) times daily. 08/23/23   [name], PA-C  rifaximin (XIFAXAN) 550 MG TABS tablet Take 1 tablet (550 mg total) by mouth 3 (three) times daily for 14 days. 12/11/23 12/25/23  [name], PA-C  rizatriptan (MAXALT) 10 MG tablet Take 1 tablet at the onset of migraine. May repeat in 2 hours if needed 11/09/23   [name], NP  mirtazapine (REMERON) 30 MG tablet Take 1 tablet (30 mg total) by mouth at bedtime. Patient not taking: Reported on 11/09/2023 10/25/20 12/01/20  [name], MD    Family History Family History  Problem Relation Age of Onset   Hypertension Mother    Cancer Mother    Alcohol abuse Father    Alcohol abuse Paternal Grandfather    Depression Maternal Aunt    Depression Paternal Aunt    Colon cancer Neg Hx    Pancreatic cancer Neg Hx     Social History Social History   Tobacco Use   Smoking status: Never    Passive exposure: Past   Smokeless tobacco: Never  Vaping Use   Vaping status: Never Used  Substance Use Topics   Alcohol use: No   Drug use: No      Allergies   Codeine   Review of Systems Review of Systems Per HPI  Physical Exam Triage Vital Signs ED Triage Vitals  Encounter Vitals Group     BP 12/12/23 1322 121/77     Systolic BP Percentile --      Diastolic BP Percentile --      Pulse Rate 12/12/23 1322 92     Resp 12/12/23 1322 18     Temp 12/12/23 1322 98.3 F (36.8 C)     Temp Source  12/12/23 1322 Oral     SpO2 12/12/23 1322 97 %     Weight --      Height --      Head Circumference --      Peak Flow --      Pain Score 12/12/23 1323 0     Pain Loc --      Pain Education --      Exclude from Growth Chart --    Orthostatic VS for the past 24 hrs:  BP- Lying Pulse- Lying BP- Sitting Pulse- Sitting BP- Standing at 0 minutes Pulse- Standing at 0 minutes  12/12/23 1325 135/81 81 118/76 81 119/80 87    Updated Vital Signs BP 121/77 (BP Location: Right Arm)   Pulse 92   Temp 98.3 F (36.8 C) (Oral)   Resp 18   LMP 11/20/2023 (Exact Date)   SpO2 97%   Visual Acuity Right Eye Distance:   Left Eye Distance:   Bilateral Distance:    Right Eye Near:   Left Eye Near:    Bilateral Near:     Physical Exam Vitals and nursing note reviewed.  Constitutional:      General: She is not in acute distress.    Appearance: Normal appearance.  HENT:     Head: Normocephalic.  Eyes:     Extraocular Movements: Extraocular movements intact.     Pupils: Pupils are equal, round, and reactive to light.  Cardiovascular:     Rate and Rhythm: Regular rhythm.     Pulses: Normal pulses.     Heart sounds: Normal heart sounds.  Pulmonary:     Effort: Pulmonary effort is normal.     Breath sounds: Normal breath sounds.  Abdominal:     General: Bowel sounds are normal.     Palpations: Abdomen is soft.     Tenderness: There is no abdominal tenderness.  Musculoskeletal:     Cervical back: Normal range of motion.  Skin:    General: Skin is warm and dry.  Neurological:     General: No focal deficit present.      Mental Status: She is oriented to person, place, and time.     GCS: GCS eye subscore is 4. GCS verbal subscore is 5. GCS motor subscore is 6.     Cranial Nerves: Cranial nerves 2-12 are intact.     Sensory: Sensation is intact.     Motor: Motor function is intact.     Coordination: Coordination is intact.     Gait: Gait is intact.  Psychiatric:        Mood and Affect: Mood normal.        Behavior: Behavior normal.     UC Treatments / Results  Labs (all labs ordered are listed, but only abnormal results are displayed) Labs Reviewed  POCT URINALYSIS DIP (MANUAL ENTRY) - Abnormal; Notable for the following components:      Result Value   Clarity, UA cloudy (*)    Leukocytes, UA Trace (*)    All other components within normal limits  CBC WITH DIFFERENTIAL/PLATELET  COMPREHENSIVE METABOLIC PANEL    EKG: Normal sinus rhythm, no ectopy, no STEMI.  Compared to EKGs performed on 06/29/2023, 06/30/2021, and 12/26/2014.   Radiology No results found.  Procedures Procedures (including critical care time)  Medications Ordered in UC Medications - No data to display  Initial Impression / Assessment and Plan / UC Course  I have reviewed the triage vital signs and the nursing notes.  Pertinent labs & imaging results that were available during my care of the patient were reviewed by me and considered in my medical decision making (see chart for details).  EKG was negative, urinalysis shows trace leukocytes, urine culture pending, along with CBC and CMP, lab check for safety.  Orthostatic vital signs were also negative.  On exam, no neurological deficits were observed.  Difficult to ascertain the cause of the patient's dizziness.  Patient describes symptoms consistent with near syncope, cannot rule out vertigo at this time.  Will start meclizine 12.5 mg for dizziness.  Patient was advised if the lab work is negative, and she continues to experience symptoms, it is recommended that she follow-up  with cardiology for further evaluation.  Supportive care recommendations were provided and discussed with the patient to include increasing fluids, rest, and to avoid sudden movement.  Patient was given strict ER follow-up precautions.  Patient was in agreement with this plan of care and verbalized understanding.  All questions were answered.  Patient stable for discharge.  Final Clinical Impressions(s) / UC Diagnoses   Final diagnoses:  Dizziness   Discharge Instructions   None    ED Prescriptions   None    PDMP not reviewed this encounter.   Abran Cantor, NP 12/12/23 1420

## 2023-12-13 LAB — CBC WITH DIFFERENTIAL/PLATELET
Basophils Absolute: 0 10*3/uL (ref 0.0–0.2)
Basos: 0 %
EOS (ABSOLUTE): 0.2 10*3/uL (ref 0.0–0.4)
Eos: 2 %
Hematocrit: 35.1 % (ref 34.0–46.6)
Hemoglobin: 11.3 g/dL (ref 11.1–15.9)
Immature Grans (Abs): 0 10*3/uL (ref 0.0–0.1)
Immature Granulocytes: 0 %
Lymphocytes Absolute: 3.4 10*3/uL — ABNORMAL HIGH (ref 0.7–3.1)
Lymphs: 37 %
MCH: 29.8 pg (ref 26.6–33.0)
MCHC: 32.2 g/dL (ref 31.5–35.7)
MCV: 93 fL (ref 79–97)
Monocytes Absolute: 0.6 10*3/uL (ref 0.1–0.9)
Monocytes: 6 %
Neutrophils Absolute: 5.1 10*3/uL (ref 1.4–7.0)
Neutrophils: 55 %
Platelets: 393 10*3/uL (ref 150–450)
RBC: 3.79 x10E6/uL (ref 3.77–5.28)
RDW: 12.3 % (ref 11.7–15.4)
WBC: 9.3 10*3/uL (ref 3.4–10.8)

## 2023-12-13 LAB — COMPREHENSIVE METABOLIC PANEL
ALT: 11 IU/L (ref 0–32)
AST: 11 IU/L (ref 0–40)
Albumin: 4.1 g/dL (ref 3.9–4.9)
Alkaline Phosphatase: 95 IU/L (ref 44–121)
BUN/Creatinine Ratio: 11 (ref 9–23)
BUN: 9 mg/dL (ref 6–24)
Bilirubin Total: 0.3 mg/dL (ref 0.0–1.2)
CO2: 22 mmol/L (ref 20–29)
Calcium: 9.2 mg/dL (ref 8.7–10.2)
Chloride: 104 mmol/L (ref 96–106)
Creatinine, Ser: 0.85 mg/dL (ref 0.57–1.00)
Globulin, Total: 2.7 g/dL (ref 1.5–4.5)
Glucose: 88 mg/dL (ref 70–99)
Potassium: 4.1 mmol/L (ref 3.5–5.2)
Sodium: 141 mmol/L (ref 134–144)
Total Protein: 6.8 g/dL (ref 6.0–8.5)
eGFR: 86 mL/min/{1.73_m2} (ref >59)

## 2023-12-13 LAB — URINE CULTURE: Culture: NO GROWTH

## 2023-12-18 ENCOUNTER — Telehealth: Payer: Self-pay | Admitting: *Deleted

## 2023-12-18 NOTE — Telephone Encounter (Signed)
 Received refill request for Xifaxan. Pt last OV 12/11/2023

## 2023-12-18 NOTE — Telephone Encounter (Signed)
 This is not a medication that we will refill.  She was treated with a 14-day course.  That is the course of the medication, then we stop it.  In some cases, we will prescribe another course, but that is only if symptoms improve and then return a few weeks to months later.  She should have just finished this medication a few days ago, so no need for refill right now.

## 2023-12-20 DIAGNOSIS — G5750 Tarsal tunnel syndrome, unspecified lower limb: Secondary | ICD-10-CM | POA: Diagnosis not present

## 2023-12-20 DIAGNOSIS — M79672 Pain in left foot: Secondary | ICD-10-CM | POA: Diagnosis not present

## 2023-12-20 NOTE — Telephone Encounter (Signed)
 Noted.

## 2023-12-25 ENCOUNTER — Ambulatory Visit
Admission: RE | Admit: 2023-12-25 | Discharge: 2023-12-25 | Disposition: A | Discharge status: Home / Self Care | Source: Ambulatory Visit | Attending: Nurse Practitioner | Admitting: Nurse Practitioner

## 2023-12-25 VITALS — BP 133/83 | HR 102 | Temp 98.1°F | Resp 16

## 2023-12-25 DIAGNOSIS — J00 Acute nasopharyngitis [common cold]: Secondary | ICD-10-CM | POA: Diagnosis not present

## 2023-12-25 LAB — POC COVID19/FLU A&B COMBO
Covid Antigen, POC: NEGATIVE
Influenza A Antigen, POC: NEGATIVE
Influenza B Antigen, POC: NEGATIVE

## 2023-12-25 MED ORDER — PROMETHAZINE-DM 6.25-15 MG/5ML PO SYRP
5.0000 mL | ORAL_SOLUTION | Freq: Four times a day (QID) | ORAL | 0 refills | Status: DC | PRN
Start: 2023-12-25 — End: 2024-03-11

## 2023-12-25 NOTE — Discharge Instructions (Addendum)
 Your COVID, Influenza are all negative. You may been diagnosed with a virus. The treatment recommendation is symptom.  We encourage you to use Tylenol alternating with Ibuprofen for your fever if not contraindicated. (Remember to use as directed do not exceed daily dosing recommendations).  We also encourage salt water gargles for your sore throat. You should also consider throat lozenges and chloraseptic spray.  Your cough can be soothed with a cough suppressant. We have prescribed you a cough suppressant to be taken as  directed.

## 2023-12-25 NOTE — ED Triage Notes (Signed)
 Pt reports, headache, nasal drainage, nasal congestion, headache, nausea vomiting, sweats x 4 days

## 2023-12-25 NOTE — ED Provider Notes (Signed)
 [facility]    CSN: [id number] Arrival date & time: 12/25/23  1228      History   Chief Complaint Chief Complaint  Patient presents with   Nasal Congestion    Allergies I'm hoping, but I started throwing up early this morning been feeling bad since friday - Entered by patient    HPI Tonya Love is a 48 y.o. female.   HPI  She is in today for evaluation of cough and congestion.  She endorses that this morning she suffered episode of vomiting and along with sweats.  She is unsure if she had a fever.  She endorses that she works in [employer] but is unsure of anyone else being sick.  She denies any dizziness, ear pain, shortness of breath, chest pain, wheezing.  She has been taking over-the-counter allergy medicine and along with nasal spray with little to no relief.    Past Medical History:  Diagnosis Date   Anxiety    Chronic diarrhea    Depression    GERD (gastroesophageal reflux disease)    Helicobacter pylori gastritis 10/2021   Treated with generic Prevpac, then treated with levofloxacin, amoxicillin, and PPI BID (July 2023)   Migraines    Pancreatitis    Sleep apnea     Patient Active Problem List   Diagnosis Date Noted   Chronic diarrhea 06/24/2022   H. pylori infection 03/18/2022   Helicobacter pylori gastritis 02/24/2022   Right tennis elbow 02/23/2022   Mild sleep apnea 12/13/2021   Obesity 12/13/2021   Left flank pain 09/03/2021   Early satiety 12/18/2019   Dysphagia 08/27/2019   Anemia 08/26/2019   Diarrhea 08/26/2019   Gastroesophageal reflux disease 08/26/2019   Nausea without vomiting 08/26/2019   History of diverticulitis 08/26/2019   Rectal bleeding 08/26/2019   Anxiety state 02/11/2014    Past Surgical History:  Procedure Laterality Date   BIOPSY  11/06/2019   Procedure: BIOPSY;  Surgeon: Corbin Ade, MD;  [facility];  Service: Endoscopy;;   BIOPSY  11/12/2021   Procedure: BIOPSY;  Surgeon: Lanelle Bal, DO;  [facility];  Service: Endoscopy;;   BIOPSY  10/30/2023   Procedure: BIOPSY;  Surgeon: Corbin Ade, MD;  [facility]: [facility];  Service: Endoscopy;;   CHOLECYSTECTOMY     COLONOSCOPY N/A 11/06/2019   Procedure: COLONOSCOPY;  Surgeon: Corbin Ade, diverticulosis in the sigmoid colon, minimal grade 2 hemorrhoids, otherwise normal.  Segmental biopsies benign.  Repeat colonoscopy in 10 years.   ESOPHAGOGASTRODUODENOSCOPY N/A 11/06/2019   Procedure: ESOPHAGOGASTRODUODENOSCOPY (EGD);  Surgeon: Corbin Ade, MD; mild Schatzki's ring s/p dilation, abnormal distal esophageal mucosa with benign biopsies, small hiatal hernia, otherwise normal.   ESOPHAGOGASTRODUODENOSCOPY (EGD) WITH PROPOFOL N/A 11/12/2021   Surgeon: Lanelle Bal, DO; small hiatal hernia, mild Schatzki's ring dilated, esophageal biopsies with reflux changes, H. pylori gastritis.  Treated with Prevpac.   ESOPHAGOGASTRODUODENOSCOPY (EGD) WITH PROPOFOL N/A 10/30/2023   Surgeon: Corbin Ade, MD; Mild Schatzki's ring dilated, small hiatal hernia, normal examined duodenum biopsy.  Duodenal biopsies benign.   MALONEY DILATION N/A 11/06/2019   Procedure: Elease Hashimoto DILATION;  Surgeon: Corbin Ade, MD;  [facility];  Service: Endoscopy;  Laterality: N/A;   MALONEY DILATION N/A 10/30/2023   Procedure: Elease Hashimoto DILATION;  Surgeon: Corbin Ade, MD;  [facility]: [facility];  Service: Endoscopy;  Laterality: N/A;  8:30 am, asa 2   TUBAL LIGATION  OB History     Gravida      Para      Term      Preterm      AB      Living  2      SAB      IAB      Ectopic      Multiple      Live Births               Home Medications    Prior to Admission medications   Medication Sig Start Date End Date Taking? Authorizing Provider  cholestyramine (QUESTRAN) 4 g packet Take 1 packet (4 g total) by mouth daily before lunch. 04/06/23   [name], PA-C  ferrous sulfate  325 (65 FE) MG tablet Take 325 mg by mouth daily with breakfast.    [provider]  Fremanezumab-vfrm (AJOVY) 225 MG/1.5ML SOAJ INJECT 1 PEN INTO THE SKIN EVERY 30 DAYS 11/09/23   [name], NP  gabapentin (NEURONTIN) 100 MG capsule Take 1 capsule (100 mg total) by mouth 2 (two) times daily. TAKE 1 CAPSULE(100 MG) BY MOUTH TWICE DAILY 11/09/23   [name], NP  meclizine (ANTIVERT) 12.5 MG tablet Take 1 tablet (12.5 mg total) by mouth 3 (three) times daily as needed for dizziness. 12/12/23   [name], [name], NP  ondansetron (ZOFRAN) 4 MG tablet Take 1 tablet (4 mg total) by mouth every 8 (eight) hours as needed for nausea or vomiting. 10/06/23   [name], PA-C  pantoprazole (PROTONIX) 40 MG tablet Take 1 tablet (40 mg total) by mouth 2 (two) times daily. 08/23/23   [name], PA-C  rifaximin (XIFAXAN) 550 MG TABS tablet Take 1 tablet (550 mg total) by mouth 3 (three) times daily for 14 days. 12/11/23 12/25/23  [name], PA-C  rizatriptan (MAXALT) 10 MG tablet Take 1 tablet at the onset of migraine. May repeat in 2 hours if needed 11/09/23   [name], NP  mirtazapine (REMERON) 30 MG tablet Take 1 tablet (30 mg total) by mouth at bedtime. Patient not taking: Reported on 11/09/2023 10/25/20 12/01/20  [name], MD    Family History Family History  Problem Relation Age of Onset   Hypertension Mother    Cancer Mother    Alcohol abuse Father    Alcohol abuse Paternal Grandfather    Depression Maternal Aunt    Depression Paternal Aunt    Colon cancer Neg Hx    Pancreatic cancer Neg Hx     Social History Social History   Tobacco Use   Smoking status: Never    Passive exposure: Past   Smokeless tobacco: Never  Vaping Use   Vaping status: Never Used  Substance Use Topics   Alcohol use: No   Drug use: No     Allergies   Codeine   Review of Systems Review of Systems   Physical Exam Triage Vital Signs ED Triage Vitals   Encounter Vitals Group     BP 12/25/23 1239 133/83     Systolic BP Percentile --      Diastolic BP Percentile --      Pulse Rate 12/25/23 1239 (!) 102     Resp 12/25/23 1239 16     Temp 12/25/23 1239 98.1 F (36.7 C)     Temp Source 12/25/23 1239 Oral     SpO2 12/25/23 1239 95 %     Weight --  Height --      Head Circumference --      Peak Flow --      Pain Score 12/25/23 1241 5     Pain Loc --      Pain Education --      Exclude from Growth Chart --    No data found.  Updated Vital Signs BP 133/83 (BP Location: Right Arm)   Pulse (!) 102   Temp 98.1 F (36.7 C) (Oral)   Resp 16   LMP 11/20/2023 (Exact Date)   SpO2 95%   Visual Acuity Right Eye Distance:   Left Eye Distance:   Bilateral Distance:    Right Eye Near:   Left Eye Near:    Bilateral Near:     Physical Exam Constitutional:      General: She is not in acute distress.    Appearance: She is obese. She is not toxic-appearing.  HENT:     Head: Normocephalic.     Right Ear: Tympanic membrane normal.     Left Ear: Tympanic membrane normal.     Nose: Nose normal.     Mouth/Throat:     Mouth: Mucous membranes are moist.  Eyes:     Pupils: Pupils are equal, round, and reactive to light.  Cardiovascular:     Rate and Rhythm: Regular rhythm. Tachycardia present.     Pulses: Normal pulses.     Heart sounds: Normal heart sounds.  Pulmonary:     Effort: Pulmonary effort is normal.     Breath sounds: Normal breath sounds. No stridor. No wheezing, rhonchi or rales.  Abdominal:     Palpations: Abdomen is soft.     Comments: Increased abdominal girth hypoactive   Musculoskeletal:        General: Normal range of motion.     Cervical back: Normal range of motion.  Skin:    General: Skin is warm and dry.     Capillary Refill: Capillary refill takes less than 2 seconds.  Neurological:     General: No focal deficit present.     Mental Status: She is alert and oriented to person, place, and time.   Psychiatric:        Mood and Affect: Mood normal.        Behavior: Behavior normal.      UC Treatments / Results  Labs (all labs ordered are listed, but only abnormal results are displayed) Labs Reviewed  POC COVID19/FLU A&B COMBO    EKG   Radiology No results found.  Procedures Procedures (including critical care time)  Medications Ordered in UC Medications - No data to display  Initial Impression / Assessment and Plan / UC Course  I have reviewed the triage vital signs and the nursing notes.  Pertinent labs & imaging results that were available during my care of the patient were reviewed by me and considered in my medical decision making (see chart for details).    Nausea ma'am you back to the area and vomiting  Final Clinical Impressions(s) / UC Diagnoses   Final diagnoses:  None   Discharge Instructions   None    ED Prescriptions   None    PDMP not reviewed this encounter.   Thad Ranger [place], [place] 12/25/23 [phone]

## 2023-12-26 ENCOUNTER — Ambulatory Visit: Admitting: Cardiovascular Disease

## 2023-12-27 NOTE — Progress Notes (Signed)
 Cardiology Office Note:    Date:  01/04/2024   ID:  Tonya Love, DOB 04-10-1976, MRN [id number]  PCP:  Patient, No Pcp Per  Cardiologist:  Wendie Hamburg, MD     Referring MD: No ref. provider found   Chief Complaint: tachycardia and dizziness  History of Present Illness:    Tonya Love is a 48 y.o. female with a history of atypical chest pain with normal coronaries on coronary CTA in 09/2021, palpitations with rare PACs/ PVCs but no significant arrhythmias noted on monitor in 06/2021 and 06/2023, obstructive sleep apnea on CPAP, pancreatitis, and anxiety/depression who is followed by Dr. Truddie Furrow and presents today for further evaluation of tachycardia/dizziness.    Patient was referred to Dr. Alda Amas in 06/2021 for further evaluation of palpitations after recent urgent care visit for this.  At that visit, she also reported a sharp pain in that her left side and occasional chest pains.  Zio monitor and coronary CTA were ordered for further evaluation.  Monitor showed rare PACs/PVCs but no significant arrhythmias.  Triggered events corresponded to sinus rhythm.  Coronary CTA showed a coronary calcium score of 0 with no evidence of CAD.  Echo was ordered in 10/2021 and showed LVEF of 60-65% with normal wall motion and diastolic parameters, normal RV function, and no significant valvular disease.  She was last seen by Dr. Alda Amas in 06/2023 at which time she was doing okay.  She did report a recent episode of chest pain at rest; however, she denied any exertional chest pain.  She also reported a little shortness of breath when walking up hills and occasional lightheadedness.  She also noted daily palpitations that last for a minute at a time and were different than her prior symptoms. Chest pain was felt to be atypical and no further evaluation was recommended.  Repeat Zio monitor was ordered which again showed rare PACs/PVCs but no significant arrhythmias.  Patient called our  office on 12/12/2023 with reports of mild tachycardia and dizziness.  Therefore, this visit was arranged for further evaluation. She states she has been having dizziness for a few month now. She states the dizziness is worse first thing in the morning when she rolls over in bed. She states it is so bad sometimes that she feels like she is going to pass out. However, she denies any overt syncope. Dizziness improves when she gets up and starts walking around. This sounds more like vertigo but she has not had any improvement with Meclizine. She also report occasional dizziness during the day bu primarily it is first thing in the morning. She also reports continued palpitations/ tachycardia but states this is not associated with the dizziness. The palpitations will occur randomly. For instance, the other days she was just sitting on the couch when all of a sudden her heart started racing. Smart watch showed her heart rates were in the 130s. This lasted for a couple of minutes and then resolved on its own. However, she also reports low hear rates in the the 40s at times. These palpitations have been going on for a while. No significant arrhythmias noted on prior monitors.   She also reports atypical chest pain. She reports 2 different pinpoint areas of pain - one on upper left side of chest and one under left breast along ribs. This pain occurs randomly and only last for a couple of seconds and then resolves on its own. No other chest pain. No exertional chest pain. Her  allergies have been worse lately and she has been have a cough and occasional "wheezing" but no significant shortness of breath. No orthopnea. She reports occasionally waking up feeling short of breath in the middle of the night; however, she has untreated sleep apnea. It does not sound like true PND. She does have chronic mild lower extremity edema, mostly noticeably around sock line.    EKGs/Labs/Other Studies Reviewed:    The following studies  were reviewed:  Monitor 2021/07/02 to 07/09/2021: Patient had a min HR of 50 bpm, max HR of 163 bpm, and avg HR of 91 bpm. Predominant underlying rhythm was Sinus Rhythm. Isolated SVEs were rare (<1.0%), and no SVE Couplets or SVE Triplets were present. Isolated VEs were rare (<1.0%), and no VE Couplets or VE Triplets were present.  5 patient triggered events corresponded to sinus rhythm.  No significant abnormalities. _______________  Coronary CTA 10/04/2021: 1. Calcium score 0 2.  Normal right dominant coronary arteries 3. Dilated main PA 3.0 cm relative to aortic root only 2.4 cm Consider f/u echo to assess pulmonary HTN _______________  Echocardiogram 11/04/2021: Impressions: 1. Left ventricular ejection fraction, by estimation, is 60 to 65%. The  left ventricle has normal function. The left ventricle has no regional  wall motion abnormalities. Left ventricular diastolic parameters were  normal.   2. Right ventricular systolic function is normal. The right ventricular  size is normal.   3. The mitral valve is normal in structure. No evidence of mitral valve  regurgitation. No evidence of mitral stenosis.   4. The aortic valve is normal in structure. Aortic valve regurgitation is  not visualized. No aortic stenosis is present.   5. The inferior vena cava is normal in size with greater than 50%  respiratory variability, suggesting right atrial pressure of 3 mmHg.  _______________  Monitor 07/08/2023 to 07/17/2023: Patient had a min HR of 45 bpm, max HR of 162 bpm, and avg HR of 88 bpm. Predominant underlying rhythm was Sinus Rhythm. Isolated SVEs were rare (<1.0%), SVE Triplets were rare (<1.0%), and no SVE Couplets were present. Isolated VEs were rare (<1.0%), and no VE Couplets or VE Triplets were present.   No significant arrhythmias   EKG:  EKG ordered today. EKG personally reviewed and demonstrates normal sinus rhythm, rate 92 bpm, with no acute ST/T wave changes. Normal  axis. Normal PR and QRS intervals. QTc 450 ms.  Recent Labs: 12/12/2023: ALT 11; BUN 9; Creatinine, Ser 0.85; Hemoglobin 11.3; Platelets 393; Potassium 4.1; Sodium 141  Recent Lipid Panel    Component Value Date/Time   CHOL 178 07/02/21 1457   TRIG 190 (H) 02-Jul-2021 1457   HDL 43 07/02/2021 1457   CHOLHDL 4.1 July 02, 2021 1457   VLDL 38 02-Jul-2021 1457   LDLCALC 97 July 02, 2021 1457    Physical Exam:    Vital Signs: BP 108/70   Pulse 92   Ht 5\' 2"  (1.575 m)   Wt 190 lb (86.2 kg)   LMP 11/20/2023 (Exact Date)   SpO2 97%   BMI 34.75 kg/m     Wt Readings from Last 3 Encounters:  01/04/24 190 lb (86.2 kg)  12/11/23 188 lb 9.6 oz (85.5 kg)  11/09/23 189 lb (85.7 kg)     General: 48 y.o. Caucasian female in no acute distress. HEENT: Normocephalic and atraumatic. Sclera clear.  Neck: Supple. No carotid bruits. No JVD. Heart: RRR. Distinct S1 and S2. No murmurs, gallops, or rubs.  Lungs: No increased work of breathing. Clear  to ausculation bilaterally. No wheezes, rhonchi, or rales.  Extremities: No lower extremity edema.  Radial pulses 2+ and equal bilaterally. Skin: Warm and dry. Neuro: No focal deficits. Psych: Normal affect. Responds appropriately.   Assessment:    1. Dizziness   2. Palpitations   3. Atypical chest pain   4. Obstructive sleep apnea     Plan:    Dizziness Patient report dizziness over the last few months that occurs primarily first thing in the morning when she turns over in bed. It actually improves when she gets up and start walking.  - BP soft in the office today but patient states that BP is not usual that low. Did not check orthostatic vitals signs since patient said dizziness improves when she gets up and walks.  - Symptoms sound more like vertigo; although, she denies any significant improvement with vertigo. Recommended talking with PCP - she may benefit from vestibular therapy.  - Symptoms do not sound cardiac to me. Dizziness does not seem  to be clearly associated with palpitations. However, did recommend Kardia mobile device.  Palpitations Patient has a long history of palpitations. Monitor in 07/2021 and again in 06/2023 showed rare PACs/ PVCs but no significant arrhythmias.  - EKG today shows normal sinus rhythm.  - She continues to have intermittent palpitations/ tachycardia. This occurs on almost a daily basis but only last for a few minutes at a time. Overall sounds stable. Main complaint today seems to be dizziness rather than palpitations.  - Would hold off on any AV nodal agents given no significant arrhythmias noted on prior monitors and soft BP.  However, could consider PRN Lopressor in the future if needed.  - Recommended Kardia mobile device.  Atypical Chest Pain Patient has a history of atypical chest pain in the past. Coronary in 09/2021 showed a coronary calcium score of 0 with no evidence of CAD.  - She continues to have some very atypical chest pain. She describes this a 2 area of pinpoint pain that last a couple of seconds at a time. Pain is reproducible with palpation on exam. - Does not sound like cardiac chest pain. Sounds musculoskeletal. No additional ischemic work-up necessary.  Obstructive Sleep Apnea - No-compliant with CPAP. Recommended compliance.  - Previously followed by Pulmonology.  Disposition: Patient already has follow-up with Dr. Alda Amas in 03/2024.    Signed, Casimer Clear, PA-C  01/04/2024 2:55 PM    [facility]

## 2024-01-04 ENCOUNTER — Ambulatory Visit: Attending: Student | Admitting: Student

## 2024-01-04 ENCOUNTER — Encounter: Payer: Self-pay | Admitting: Student

## 2024-01-04 VITALS — BP 108/70 | HR 92 | Ht 62.0 in | Wt 190.0 lb

## 2024-01-04 DIAGNOSIS — R0789 Other chest pain: Secondary | ICD-10-CM | POA: Diagnosis not present

## 2024-01-04 DIAGNOSIS — R42 Dizziness and giddiness: Secondary | ICD-10-CM | POA: Diagnosis not present

## 2024-01-04 DIAGNOSIS — G4733 Obstructive sleep apnea (adult) (pediatric): Secondary | ICD-10-CM

## 2024-01-04 DIAGNOSIS — R002 Palpitations: Secondary | ICD-10-CM

## 2024-01-04 NOTE — Patient Instructions (Signed)
 Medication Instructions:  The current medical regimen is effective;  continue present plan and medications as directed. Please refer to the Current Medication list given to you today.  *If you need a refill on your cardiac medications before your next appointment, please call your pharmacy*  Lab Work:   Testing/Procedures: NONE    NONE  Follow-Up: At [facility], you and your health needs are our priority.  As part of our continuing mission to provide you with exceptional heart care, our providers are all part of one team.  This team includes your primary Cardiologist (physician) and Advanced Practice Providers or APPs (Physician Assistants and Nurse Practitioners) who all work together to provide you with the care you need, when you need it.  Your next appointment:   KEEP SCHEDULED APPOINTMENT   Provider:   Wendie Hamburg, MD    Other Instructions [facility] [employer]      1st Floor: - Lobby - Registration  - Pharmacy  - Lab - Cafe  2nd Floor: - PV Lab - Diagnostic Testing (echo, CT, nuclear med)  3rd Floor: - Vacant  4th Floor: - TCTS (cardiothoracic surgery) - AFib Clinic - Structural Heart Clinic - Vascular Surgery  - Vascular Ultrasound  5th Floor: - HeartCare Cardiology (general and EP) - Clinical Pharmacy for coumadin, hypertension, lipid, weight-loss medications, and med management appointments    Valet parking services will be available as well.

## 2024-01-16 DIAGNOSIS — G5750 Tarsal tunnel syndrome, unspecified lower limb: Secondary | ICD-10-CM | POA: Diagnosis not present

## 2024-01-16 DIAGNOSIS — M79672 Pain in left foot: Secondary | ICD-10-CM | POA: Diagnosis not present

## 2024-01-18 DIAGNOSIS — K219 Gastro-esophageal reflux disease without esophagitis: Secondary | ICD-10-CM | POA: Diagnosis not present

## 2024-01-18 DIAGNOSIS — Z6834 Body mass index (BMI) 34.0-34.9, adult: Secondary | ICD-10-CM | POA: Diagnosis not present

## 2024-01-18 DIAGNOSIS — H81399 Other peripheral vertigo, unspecified ear: Secondary | ICD-10-CM | POA: Diagnosis not present

## 2024-01-22 ENCOUNTER — Telehealth: Payer: Self-pay | Admitting: Adult Health

## 2024-01-22 NOTE — Telephone Encounter (Signed)
 I have seen the patient in the past for migraines.  I have never seen her for dizziness.  Can we find out if this is a new symptom? She was put on my schedule for a virtual visit but this would need to be scheduled for an in person visit and certainly if she has not seen her primary care for this I would start there.  As dizziness has several different etiologies.

## 2024-01-22 NOTE — Telephone Encounter (Signed)
 Patient was having dizzy spells went to Urgent Care and they prescribed vertigo meds. Theses aren't working so they suggested she see her heart doctor and Pcp. Dr, Hasani from [facility] a prescribed Gabapentin  and the pharmacy would'nt fill because she is already taking it. It was suggested she call Megan. She has been having this issue for 3 months and it's getting worse. She wanted to see if she could get in for an appointment or if she needs an MR would you request it for her?

## 2024-01-22 NOTE — Telephone Encounter (Signed)
 Patient was having dizzy spells went to Urgent Care and they prescribed vertigo meds. Theses aren't working so they suggested she see her heart doctor and Pcp. Dr, Hasani from [facility] a prescribed Gabapentin  and the pharmacy would'nt fill b

## 2024-01-23 ENCOUNTER — Telehealth: Admitting: Adult Health

## 2024-01-23 NOTE — Telephone Encounter (Signed)
 I called pt.  She has seen Cardiology, after being to urgent care for dizziness ongoing issue for several months (dizzy when turning in bed but resolves when OOB.   She then has seen pcp, Dr. Michae Aden at [facility], she said she was placed on gabapentin  (which is something she was on already, they mentioned MRI, I told her that she needs to followup with them if they are going to order.  Megan, NP also will not be able to see her for this is a new problem and she would need to see MD.  Pt verbalized understanding of this and appt would be cancelled with her today.  I told her we are happy to see her would just need referral and would see MD vs NP initial visit.  She appreciated call back and verbalized understanding of plan.

## 2024-01-24 ENCOUNTER — Other Ambulatory Visit (HOSPITAL_COMMUNITY): Payer: Self-pay

## 2024-01-24 ENCOUNTER — Telehealth: Payer: Self-pay

## 2024-01-24 NOTE — Telephone Encounter (Signed)
 Pharmacy Patient Advocate Encounter  Received notification from [employer] that Prior Authorization for AJOVY  (fremanezumab -vfrm) injection 225MG /1.5ML auto-injectors has been APPROVED from 01/24/2024 to 01/23/2026. Ran test claim, Copay is $0. This test claim was processed through [facility]- copay amounts may vary at other pharmacies due to pharmacy/plan contracts, or as the patient moves through the different stages of their insurance plan.   PA #/Case ID/Reference #: PA Case ID #: [id number]

## 2024-01-24 NOTE — Telephone Encounter (Signed)
 Pharmacy Patient Advocate Encounter   Received notification from Fax that prior authorization for AJOVY  (fremanezumab -vfrm) injection 225MG /1.5ML auto-injectors is required/requested.   Insurance verification completed.   The patient is insured through [facility] .   Per test claim: PA required; PA submitted to above mentioned insurance via CoverMyMeds Key/confirmation #/EOC [id number] Status is pending

## 2024-01-30 DIAGNOSIS — R42 Dizziness and giddiness: Secondary | ICD-10-CM | POA: Diagnosis not present

## 2024-02-01 ENCOUNTER — Other Ambulatory Visit: Payer: Self-pay | Admitting: Gastroenterology

## 2024-02-01 ENCOUNTER — Telehealth: Payer: Self-pay | Admitting: *Deleted

## 2024-02-01 DIAGNOSIS — R197 Diarrhea, unspecified: Secondary | ICD-10-CM

## 2024-02-01 MED ORDER — CHOLESTYRAMINE 4 G PO PACK
4.0000 g | PACK | Freq: Every day | ORAL | 2 refills | Status: AC
Start: 2024-02-01 — End: ?

## 2024-02-01 NOTE — Telephone Encounter (Signed)
 Pt called and states she needs a refill on cholestyramine . Pt last OV 12/11/2023

## 2024-02-01 NOTE — Telephone Encounter (Signed)
 Rx sent.

## 2024-02-01 NOTE — Telephone Encounter (Signed)
 Noted.

## 2024-02-22 DIAGNOSIS — Z0000 Encounter for general adult medical examination without abnormal findings: Secondary | ICD-10-CM | POA: Diagnosis not present

## 2024-02-22 DIAGNOSIS — Z6834 Body mass index (BMI) 34.0-34.9, adult: Secondary | ICD-10-CM | POA: Diagnosis not present

## 2024-03-09 NOTE — Progress Notes (Unsigned)
 Referring Provider: No ref. provider found Primary Care Physician:  Orpha Yancey LABOR, MD Primary GI Physician: Dr. Shaaron  Chief Complaint  Patient presents with   Follow-up    Doing well, no issues    HPI:   Tonya Love is a 48 y.o. female with GI history of GERD, dysphagia in the setting of Schatzki's ring, H. pylori February 2023 with documented eradication November 2023, chronic nausea, diverticulitis, postprandial diarrhea with associated abdominal cramping, toilet tissue hematochezia in the setting of hemorrhoids, presenting today for follow-up flatulence and chronic diarrhea.   Last seen in the office 12/11/23.  Noted Questran  was helping with bowel frequency, but she continued with breakthrough diarrhea and abdominal cramping related to bowel movements.  Also with ongoing issues with flatulence.  No improvement with daily probiotic or lactose-free diet.  Recent duodenal biopsies negative for celiac.  Recommend a course of Xifaxan  for IBS and possibly SIBO.  Continue Questran .  Today:  Skipping 2-3 days between bowel movements for the last couple of weeks.  Taking Questran  1 packet daily as she has been.  Some abdominal cramping related to a bowel movements continues.  Excessive gas also continues.  Was not able to get Xifaxan .  Insurance would not cover this.  No BRBPR or melena.  Dizziness for the last several months.  Worse when laying down or changing head positions.  Went to urgent care and was told that she had vertigo.  Meclizine  didn't help. Saw Dr. Orpha. Did have CT of her head and it looked ok. Was prescribed prednisone , but didn't help. Looses her balance. Having some left eye twitching.      Prior GI Work-up:  Colonoscopy Feb 2021 with diverticulosis in sigmoid colon, minimal grade 2 hemorrhoids, and benign random colon biopsies with recommendations to repeat in 10 years.     EGD Feb 2021 with mild Schatzki ring s/p dilation, abnormal distal esophageal mucosa  with benign biopsy, small hiatal hernia, otherwise normal exam.    GES wnl in April 2021.     Celiac serologies negative.   CT A/P with contrast 05/08/2020 with no significant findings.      CT A/P with contrast completed 09/28/2021 with no acute findings.   EGD 11/12/2021 revealing Schatzki's ring dilated, small hiatal hernia, erythematous mucosa in the gastric antrum. Esophageal biopsies revealed reactive squamous mucosa with increased intraepithelial lymphocytes and rare eosinophils. Gastric biopsy with chronic gastritis with H. pylori.    Gastric emptying study 06/30/2022: Normal.  H pylori breath test negative November 2023.   Past Medical History:  Diagnosis Date   Anxiety    Chronic diarrhea    Depression    GERD (gastroesophageal reflux disease)    Helicobacter pylori gastritis 10/2021   Treated with generic Prevpac, then treated with levofloxacin , amoxicillin , and PPI BID (July 2023)   Migraines    Pancreatitis    Sleep apnea     Past Surgical History:  Procedure Laterality Date   BIOPSY  11/06/2019   Procedure: BIOPSY;  Surgeon: Shaaron Lamar HERO, MD;  [facility];  Service: Endoscopy;;   BIOPSY  11/12/2021   Procedure: BIOPSY;  Surgeon: Cindie Carlin POUR, DO;  [facility]: [facility];  Service: Endoscopy;;   BIOPSY  10/30/2023   Procedure: BIOPSY;  Surgeon: Shaaron Lamar HERO, MD;  [facility];  Service: Endoscopy;;   CHOLECYSTECTOMY     COLONOSCOPY N/A 11/06/2019   Procedure: COLONOSCOPY;  Surgeon: Shaaron Lamar HERO, diverticulosis in the  sigmoid colon, minimal grade 2 hemorrhoids, otherwise normal.  Segmental biopsies benign.  Repeat colonoscopy in 10 years.   ESOPHAGOGASTRODUODENOSCOPY N/A 11/06/2019   Procedure: ESOPHAGOGASTRODUODENOSCOPY (EGD);  Surgeon: Shaaron Lamar HERO, MD; mild Schatzki's ring s/p dilation, abnormal distal esophageal mucosa with benign biopsies, small hiatal hernia, otherwise normal.   ESOPHAGOGASTRODUODENOSCOPY (EGD) WITH  PROPOFOL  N/A 11/12/2021   Surgeon: Cindie Carlin POUR, DO; small hiatal hernia, mild Schatzki's ring dilated, esophageal biopsies with reflux changes, H. pylori gastritis.  Treated with Prevpac.   ESOPHAGOGASTRODUODENOSCOPY (EGD) WITH PROPOFOL  N/A 10/30/2023   Surgeon: Shaaron Lamar HERO, MD; Mild Schatzki's ring dilated, small hiatal hernia, normal examined duodenum biopsy.  Duodenal biopsies benign.   MALONEY DILATION N/A 11/06/2019   Procedure: AGAPITO DILATION;  Surgeon: Shaaron Lamar HERO, MD;  [facility]: [facility];  Service: Endoscopy;  Laterality: N/A;   MALONEY DILATION N/A 10/30/2023   Procedure: AGAPITO DILATION;  Surgeon: Shaaron Lamar HERO, MD;  [facility]: [facility];  Service: Endoscopy;  Laterality: N/A;  8:30 am, asa 2   TUBAL LIGATION      Current Outpatient Medications  Medication Sig Dispense Refill   acyclovir ointment (ZOVIRAX) 5 % Apply 1 Application topically every 3 (three) hours.     cholestyramine  (QUESTRAN ) 4 g packet Take 1 packet (4 g total) by mouth daily before lunch. 90 each 2   ferrous sulfate 325 (65 FE) MG tablet Take 325 mg by mouth daily with breakfast.     Fremanezumab -vfrm (AJOVY ) 225 MG/1.5ML SOAJ INJECT 1 PEN INTO THE SKIN EVERY 30 DAYS 4.5 mL 3   gabapentin  (NEURONTIN ) 100 MG capsule Take 1 capsule (100 mg total) by mouth 2 (two) times daily. TAKE 1 CAPSULE(100 MG) BY MOUTH TWICE DAILY 180 capsule 3   ondansetron  (ZOFRAN ) 4 MG tablet Take 1 tablet (4 mg total) by mouth every 8 (eight) hours as needed for nausea or vomiting. 30 tablet 0   pantoprazole  (PROTONIX ) 40 MG tablet Take 1 tablet (40 mg total) by mouth 2 (two) times daily. 180 tablet 1   rizatriptan  (MAXALT ) 10 MG tablet Take 1 tablet at the onset of migraine. May repeat in 2 hours if needed 10 tablet 11   valACYclovir (VALTREX) 500 MG tablet Take 500 mg by mouth daily as needed.     No current facility-administered medications for this visit.    Allergies as of 03/11/2024 - Review Complete  03/11/2024  Allergen Reaction Noted   Codeine Nausea Only 12/25/2014    Family History  Problem Relation Age of Onset   Hypertension Mother    Cancer Mother    Alcohol abuse Father    Alcohol abuse Paternal Grandfather    Depression Maternal Aunt    Depression Paternal Aunt    Colon cancer Neg Hx    Pancreatic cancer Neg Hx     Social History   Socioeconomic History   Marital status: Single    Spouse name: Not on file   Number of children: Not on file   Years of education: Not on file   Highest education level: Not on file  Occupational History   Not on file  Tobacco Use   Smoking status: Never    Passive exposure: Past   Smokeless tobacco: Never  Vaping Use   Vaping status: Never Used  Substance and Sexual Activity   Alcohol use: No   Drug use: No   Sexual activity: Not Currently  Other Topics Concern   Not on file  Social History Narrative  Grown son lives with her   Right handed   Caffeine: 1 soda/day   Social Drivers of Corporate investment banker Strain: Low Risk  (06/26/2018)   Received [facility]   Overall Financial Resource Strain (CARDIA)    Difficulty of Paying Living Expenses: Not hard at all  Food Insecurity: Unknown (06/27/2023)   Received from [facility]   Hunger Vital Sign    Worried About Running Out of Food in the Last Year: Not on file    Within the past 12 months, the food you bought just didn't last and you didn't have money to get more.: Never true  Transportation Needs: No Transportation Needs (06/27/2023)   Received from [facility]    Lack of Transportation (Medical): No    Lack of Transportation (Non-Medical): No  Physical Activity: Inactive (06/26/2018)   Received from [facility]   Exercise Vital Sign    Days of Exercise per Week: 0 days    Minutes of Exercise per Session: 0 min  Stress: Not on file  Social Connections: Not on file    Review of Systems: Gen: Denies fever,  chills, cold or flulike symptoms, presyncope, syncope. GI: See HPI Heme: See HPI  Physical Exam: BP 117/76 (BP Location: Right Arm, Patient Position: Sitting, Cuff Size: Large)   Pulse 98   Temp 98.4 F (36.9 C) (Oral)   Ht 5' 2 (1.575 m)   Wt 184 lb 9.6 oz (83.7 kg)   LMP  (LMP Unknown)   SpO2 98%   BMI 33.76 kg/m  General:   Alert and oriented. No distress noted. Pleasant and cooperative.  Head:  Normocephalic and atraumatic. Eyes:  Conjuctiva clear without scleral icterus. Abdomen:  +BS, soft, non-tender and non-distended. No rebound or guarding. No HSM or masses noted. Msk:  Symmetrical without gross deformities. Normal posture. Neurologic:  Alert and  oriented x4 Psych:  Normal mood and affect.    Assessment:  48 year old female with GI history of GERD, dysphagia in the setting of Schatzki's ring, H. pylori February 2023 with documented eradication November 2023, chronic nausea, diverticulitis, postprandial diarrhea with associated abdominal cramping, toilet tissue hematochezia in the setting of hemorrhoids, presenting today for follow-up of flatulence and chronic diarrhea.  Chronic diarrhea/flatulence: Chronic diarrhea likely multifactorial in the setting of bile salt diarrhea and IBS.  Frequency of diarrhea improved with Questran  and now actually more so on the constipated side.  Despite Questran , she she continued with intermittent abdominal cramping with bowel movements and flatulence felt to be secondary to IBS, but also consider SIBO, EPI.  Prior trial of lactose-free diet and daily probiotic not helpful. Attempted trial of Xifaxan  for IBS and possibly SIBO, but insurance did not cover this.    At this point, recommend referral for SIBO testing.  Will also check fecal fat and fecal elastase for EPI.  Recommended trial of IBgard as well as low FODMAP diet.  To help with constipation, we will decrease Questran  to 1/2 packet daily, can discontinue completely if persistent  constipation.  Dizziness: New onset for the last 3 months.  Symptoms sound like vertigo, but patient reports no improvement with meclizine .  Reports PCP ordered CT of her head which was unrevealing and she had no improvement with prednisone .  She is coming to me for additional recommendations/possible referral as she felt that her PCP was not helpful.  I will check B12 and refer her to neurology.  Plan:  B12, fecal fat, fecal elastase Decrease Questran  to 1/2 packet daily. Start IBgard. Low FODMAP diet.  Separate handout provided. Referred to [facility] for SIBO testing. Recommended trial of Epley maneuver for dizziness.  Advised to look this up online for video instruction. Referred to neurology for dizziness. Follow-up in 8 weeks.   Tonya Centers, PA-C [facility] 03/11/2024

## 2024-03-11 ENCOUNTER — Encounter: Payer: Self-pay | Admitting: Gastroenterology

## 2024-03-11 ENCOUNTER — Other Ambulatory Visit: Payer: Self-pay | Admitting: *Deleted

## 2024-03-11 ENCOUNTER — Ambulatory Visit (INDEPENDENT_AMBULATORY_CARE_PROVIDER_SITE_OTHER): Admitting: Gastroenterology

## 2024-03-11 VITALS — BP 117/76 | HR 98 | Temp 98.4°F | Ht 62.0 in | Wt 184.6 lb

## 2024-03-11 DIAGNOSIS — K529 Noninfective gastroenteritis and colitis, unspecified: Secondary | ICD-10-CM

## 2024-03-11 DIAGNOSIS — R143 Flatulence: Secondary | ICD-10-CM

## 2024-03-11 DIAGNOSIS — R42 Dizziness and giddiness: Secondary | ICD-10-CM

## 2024-03-11 DIAGNOSIS — R197 Diarrhea, unspecified: Secondary | ICD-10-CM

## 2024-03-11 DIAGNOSIS — K580 Irritable bowel syndrome with diarrhea: Secondary | ICD-10-CM

## 2024-03-11 NOTE — Patient Instructions (Addendum)
 Please have labs/stool testing completed at [facility]. I am check vitamin B12 and stool test to check for exocrine pancreatic insufficiency.   As you are having constipation, decrease Questran  to 1/2 packet daily.  If you continue to have constipation, you can hold Questran .  To help with abdominal cramping, try IBgard.  I also recommend that you try following a low FODMAP diet.  As we discussed, this is a restrictive diet for at least 2 weeks where he cut out all high FODMAP foods.  If you are feeling better in regards to gassiness, you can try adding 1 food back every 5 days or so and monitoring for recurrent symptoms.  I am going to send you to [facility] for SIBO testing.  This stands for small intestinal bacterial overgrowth.  This can also cause increased gassiness, abdominal discomfort, diarrhea.   Due to your dizziness, I will refer you to neurology.   I will plan to see you back in the office in 8 weeks.  Josette Centers, PA-C [facility]

## 2024-03-12 ENCOUNTER — Telehealth: Payer: Self-pay | Admitting: *Deleted

## 2024-03-12 DIAGNOSIS — R143 Flatulence: Secondary | ICD-10-CM | POA: Diagnosis not present

## 2024-03-12 DIAGNOSIS — R197 Diarrhea, unspecified: Secondary | ICD-10-CM | POA: Diagnosis not present

## 2024-03-12 DIAGNOSIS — R42 Dizziness and giddiness: Secondary | ICD-10-CM | POA: Diagnosis not present

## 2024-03-12 LAB — VITAMIN B12: Vitamin B-12: 289 pg/mL (ref 200–1100)

## 2024-03-12 NOTE — Telephone Encounter (Signed)
 Not sure why there would be any issues with referral to [facility] for SIBO testing. Maybe there was concern about something different.

## 2024-03-12 NOTE — Addendum Note (Signed)
 Addended by: GAYLENE MADELIN CROME on: 03/12/2024 10:11 AM   Modules accepted: Orders

## 2024-03-12 NOTE — Telephone Encounter (Signed)
 PCP nurses called and stated that she will call back tomorrow. PCP was not happy that referral to [facility] was sent.

## 2024-03-13 ENCOUNTER — Ambulatory Visit: Payer: Self-pay | Admitting: Gastroenterology

## 2024-03-13 NOTE — Telephone Encounter (Signed)
 Not sure what this was about.

## 2024-03-25 ENCOUNTER — Ambulatory Visit: Attending: Cardiology | Admitting: Cardiology

## 2024-03-25 ENCOUNTER — Encounter: Payer: Self-pay | Admitting: Cardiology

## 2024-03-25 VITALS — BP 108/76 | HR 80 | Ht 62.0 in | Wt 182.6 lb

## 2024-03-25 DIAGNOSIS — R002 Palpitations: Secondary | ICD-10-CM | POA: Diagnosis not present

## 2024-03-25 DIAGNOSIS — R079 Chest pain, unspecified: Secondary | ICD-10-CM | POA: Diagnosis not present

## 2024-03-25 DIAGNOSIS — R42 Dizziness and giddiness: Secondary | ICD-10-CM

## 2024-03-25 NOTE — Patient Instructions (Signed)
 Medication Instructions:  Continue current medications *If you need a refill on your cardiac medications before your next appointment, please call your pharmacy*  Lab Work: none If you have labs (blood work) drawn today and your tests are completely normal, you will receive your results only by: MyChart Message (if you have MyChart) OR A paper copy in the mail If you have any lab test that is abnormal or we need to change your treatment, we will call you to review the results.  Testing/Procedures: none  Follow-Up: At [facility], you and your health needs are our priority.  As part of our continuing mission to provide you with exceptional heart care, our providers are all part of one team.  This team includes your primary Cardiologist (physician) and Advanced Practice Providers or APPs (Physician Assistants and Nurse Practitioners) who all work together to provide you with the care you need, when you need it.  Your next appointment:   1 year(s)  Provider:   Wendie Hamburg, MD    We recommend signing up for the patient portal called "MyChart".  Sign up information is provided on this After Visit Summary.  MyChart is used to connect with patients for Virtual Visits (Telemedicine).  Patients are able to view lab/test results, encounter notes, upcoming appointments, etc.  Non-urgent messages can be sent to your provider as well.   To learn more about what you can do with MyChart, go to [email or web address].   Other Instructions none

## 2024-03-25 NOTE — Progress Notes (Signed)
 Cardiology Office Note:    Date:  03/25/2024   ID:  Tonya Love, DOB May 22, 1976, MRN [id number]  PCP:  Orpha Yancey LABOR, MD  Cardiologist:  Lonni LITTIE Nanas, MD  Electrophysiologist:  None   Referring MD: No ref. provider found   Chief Complaint  Patient presents with   Dizziness    History of Present Illness:    Tonya Love is a 48 y.o. female with a hx of anxiety, depression, pancreatitis who presents for follow-up.  She is seen initially for evaluation of palpitations and chest pain on 06/30/2021.[name]  She reports she has been having palpitations which she describes as feeling her heart will race and then go slow.  Lasts for couple minutes, can occur throughout the day.  Also reports feeling short of breath with exertion.  In addition reports occasional chest pains.  Describes as sharp pain left-sided pain.  Can occur at rest.  However does report can be brought on with exertion.  She walks 1-2 times per week for 3 miles and will sometimes have chest pain when walking.  She has been told she seems to stop breathing when she sleeps.  States that she feels tired throughout the day.  Has been told she snores.  No smoking history.  Family history includes paternal uncle died of MI at 54.  Zio patch x9 days on 07/21/2021 showed no significant abnormalities.  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  Echocardiogram 11/04/2021 showed normal biventricular function, no significant valvular disease.  She reported worsening palpitations underwent repeat Zio patch x 9 days 06/2023, which showed no significant arrhythmias.  Since last clinic visit, she reports she is doing okay.  Has been having issues with lightheadedness.  States that it started in March.   first episode happened when she rolled over while lying in bed.  She was diagnosed with vertigo and started on meclizine  but has not reported improvement.  States that  it is unrelated to position change but does note with head movements, particularly if leaning back.  Typically lasts for few seconds and resolves.  She has appointment with neurology.  She denies any syncope.  She continues to have occasional palpitations and shortness of breath.  She denies any chest pain.   Past Medical History:  Diagnosis Date   Anxiety    Chronic diarrhea    Depression    GERD (gastroesophageal reflux disease)    Helicobacter pylori gastritis 10/2021   Treated with generic Prevpac, then treated with levofloxacin , amoxicillin , and PPI BID (July 2023)   Migraines    Pancreatitis    Sleep apnea     Past Surgical History:  Procedure Laterality Date   BIOPSY  11/06/2019   Procedure: BIOPSY;  Surgeon: Shaaron Lamar HERO, MD;  [facility]: [facility];  Service: Endoscopy;;   BIOPSY  11/12/2021   Procedure: BIOPSY;  Surgeon: Cindie Carlin POUR, DO;  [facility]: [facility];  Service: Endoscopy;;   BIOPSY  10/30/2023   Procedure: BIOPSY;  Surgeon: Shaaron Lamar HERO, MD;  [facility]: [facility];  Service: Endoscopy;;   CHOLECYSTECTOMY     COLONOSCOPY N/A 11/06/2019   Procedure: COLONOSCOPY;  Surgeon: Shaaron Lamar HERO, diverticulosis in the sigmoid colon, minimal grade 2 hemorrhoids, otherwise normal.  Segmental biopsies benign.  Repeat colonoscopy in 10 years.   ESOPHAGOGASTRODUODENOSCOPY N/A 11/06/2019   Procedure: ESOPHAGOGASTRODUODENOSCOPY (EGD);  Surgeon: Shaaron Lamar HERO, MD; mild Schatzki's ring s/p dilation,  abnormal distal esophageal mucosa with benign biopsies, small hiatal hernia, otherwise normal.   ESOPHAGOGASTRODUODENOSCOPY (EGD) WITH PROPOFOL  N/A 11/12/2021   Surgeon: Cindie Carlin POUR, DO; small hiatal hernia, mild Schatzki's ring dilated, esophageal biopsies with reflux changes, H. pylori gastritis.  Treated with Prevpac.   ESOPHAGOGASTRODUODENOSCOPY (EGD) WITH PROPOFOL  N/A 10/30/2023   Surgeon: Shaaron Lamar HERO, MD; Mild Schatzki's ring dilated, small hiatal  hernia, normal examined duodenum biopsy.  Duodenal biopsies benign.   MALONEY DILATION N/A 11/06/2019   Procedure: AGAPITO DILATION;  Surgeon: Shaaron Lamar HERO, MD;  Location: [facility];  Service: Endoscopy;  Laterality: N/A;   MALONEY DILATION N/A 10/30/2023   Procedure: AGAPITO DILATION;  Surgeon: Shaaron Lamar HERO, MD;  Location: [facility];  Service: Endoscopy;  Laterality: N/A;  8:30 am, asa 2   TUBAL LIGATION      Current Medications: Current Meds  Medication Sig   acyclovir ointment (ZOVIRAX) 5 % Apply 1 Application topically every 3 (three) hours.   cholestyramine  (QUESTRAN ) 4 g packet Take 1 packet (4 g total) by mouth daily before lunch.   ferrous sulfate 325 (65 FE) MG tablet Take 325 mg by mouth daily with breakfast.   Fremanezumab -vfrm (AJOVY ) 225 MG/1.5ML SOAJ INJECT 1 PEN INTO THE SKIN EVERY 30 DAYS   gabapentin  (NEURONTIN ) 100 MG capsule Take 1 capsule (100 mg total) by mouth 2 (two) times daily. TAKE 1 CAPSULE(100 MG) BY MOUTH TWICE DAILY   ondansetron  (ZOFRAN ) 4 MG tablet Take 1 tablet (4 mg total) by mouth every 8 (eight) hours as needed for nausea or vomiting.   pantoprazole  (PROTONIX ) 40 MG tablet Take 1 tablet (40 mg total) by mouth 2 (two) times daily.   rizatriptan  (MAXALT ) 10 MG tablet Take 1 tablet at the onset of migraine. May repeat in 2 hours if needed   valACYclovir (VALTREX) 500 MG tablet Take 500 mg by mouth daily as needed.     Allergies:   Codeine   Social History   Socioeconomic History   Marital status: Single    Spouse name: Not on file   Number of children: Not on file   Years of education: Not on file   Highest education level: Not on file  Occupational History   Not on file  Tobacco Use   Smoking status: Never    Passive exposure: Past   Smokeless tobacco: Never  Vaping Use   Vaping status: Never Used  Substance and Sexual Activity   Alcohol use: No   Drug use: No   Sexual activity: Not Currently  Other Topics Concern   Not on  file  Social History Narrative   Grown son lives with her   Right handed   Caffeine: 1 soda/day   Social Drivers of Corporate investment banker Strain: Low Risk  (06/26/2018)   Received [facility]   Overall Financial Resource Strain (CARDIA)    Difficulty of Paying Living Expenses: Not hard at all  Food Insecurity: Unknown (06/27/2023)   Received from [facility]   Hunger Vital Sign    Worried About Running Out of Food in the Last Year: Not on file    Within the past 12 months, the food you bought just didn't last and you didn't have money to get more.: Never true  Transportation Needs: No Transportation Needs (06/27/2023)   Received from [facility] - Transportation    Lack of Transportation (Medical): No    Lack of Transportation (  Non-Medical): No  Physical Activity: Inactive (06/26/2018)   Received from [facility]   Exercise Vital Sign    Days of Exercise per Week: 0 days    Minutes of Exercise per Session: 0 min  Stress: Not on file  Social Connections: Not on file     Family History: The patient's family history includes Alcohol abuse in her father and paternal grandfather; Cancer in her mother; Depression in her maternal aunt and paternal aunt; Hypertension in her mother. There is no history of Colon cancer or Pancreatic cancer.  ROS:   Please see the history of present illness.     All other systems reviewed and are negative.  EKGs/Labs/Other Studies Reviewed:    The following studies were reviewed today:   EKG:   06/29/2023: Normal sinus rhythm, rate 73, no ST abnormalities  Recent Labs: 12/12/2023: ALT 11; BUN 9; Creatinine, Ser 0.85; Hemoglobin 11.3; Platelets 393; Potassium 4.1; Sodium 141  Recent Lipid Panel    Component Value Date/Time   CHOL 178 06/30/2021 1457   TRIG 190 (H) 06/30/2021 1457   HDL 43 06/30/2021 1457   CHOLHDL 4.1 06/30/2021 1457   VLDL 38 06/30/2021 1457   LDLCALC 97 06/30/2021 1457    Physical Exam:     VS:  BP 108/76   Pulse 80   Ht 5' 2 (1.575 m)   Wt 182 lb 9.6 oz (82.8 kg)   LMP  (LMP Unknown)   SpO2 98%   BMI 33.40 kg/m     Wt Readings from Last 3 Encounters:  03/25/24 182 lb 9.6 oz (82.8 kg)  03/11/24 184 lb 9.6 oz (83.7 kg)  01/04/24 190 lb (86.2 kg)     GEN:  Well nourished, well developed in no acute distress HEENT: Normal NECK: No JVD; No carotid bruits LYMPHATICS: No lymphadenopathy CARDIAC: RRR, no murmurs, rubs, gallops RESPIRATORY:  Clear to auscultation without rales, wheezing or rhonchi  ABDOMEN: Soft, non-tender, non-distended MUSCULOSKELETAL:  No edema; No deformity  SKIN: Warm and dry NEUROLOGIC:  Alert and oriented x 3 PSYCHIATRIC:  Normal affect   ASSESSMENT:    1. Lightheadedness   2. Palpitations   3. Chest pain of uncertain etiology     PLAN:    Lightheadedness: Description suggests likely vertigo.  Do not suspect cardiac etiology.  She has upcoming appointment with neurology  Palpitations: Zio patch x9 days on 07/21/2021 showed no significant abnormalities. She reported worsening palpitations and underwent repeat Zio patch x 9 days 06/2023, which showed no significant arrhythmias.  Chest pain: Atypical in description but does report can be brought on by exertion.  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  Echocardiogram 11/04/2021 showed normal biventricular function, no significant valvular disease.  DOE: Echocardiogram 11/04/2021 showed normal biventricular function, no significant valvular disease.  OSA: positive sleep study 11/2021. Reports compliance with CPAP  RTC in 1 year   Medication Adjustments/Labs and Tests Ordered: Current medicines are reviewed at length with the patient today.  Concerns regarding medicines are outlined above.  No orders of the defined types were placed in this encounter.  No orders of the defined types were placed in this  encounter.   Patient Instructions  Medication Instructions:  Continue current medications *If you need a refill on your cardiac medications before your next appointment, please call your pharmacy*  Lab Work: none If you have labs (blood work) drawn today and your tests are completely  normal, you will receive your results only by: MyChart Message (if you have MyChart) OR A paper copy in the mail If you have any lab test that is abnormal or we need to change your treatment, we will call you to review the results.  Testing/Procedures: none  Follow-Up: At [facility], you and your health needs are our priority.  As part of our continuing mission to provide you with exceptional heart care, our providers are all part of one team.  This team includes your primary Cardiologist (physician) and Advanced Practice Providers or APPs (Physician Assistants and Nurse Practitioners) who all work together to provide you with the care you need, when you need it.  Your next appointment:   1 year(s)  Provider:   Lonni LITTIE Nanas, MD    We recommend signing up for the patient portal called MyChart.  Sign up information is provided on this After Visit Summary.  MyChart is used to connect with patients for Virtual Visits (Telemedicine).  Patients are able to view lab/test results, encounter notes, upcoming appointments, etc.  Non-urgent messages can be sent to your provider as well.   To learn more about what you can do with MyChart, go to [email or web address].   Other Instructions none       Signed, Lonni LITTIE Nanas, MD  03/25/2024 10:11 AM     [facility]

## 2024-04-18 ENCOUNTER — Encounter: Payer: Self-pay | Admitting: Gastroenterology

## 2024-05-24 DIAGNOSIS — R110 Nausea: Secondary | ICD-10-CM | POA: Diagnosis not present

## 2024-05-24 DIAGNOSIS — R197 Diarrhea, unspecified: Secondary | ICD-10-CM | POA: Diagnosis not present

## 2024-05-24 DIAGNOSIS — R143 Flatulence: Secondary | ICD-10-CM | POA: Diagnosis not present

## 2024-05-24 DIAGNOSIS — R635 Abnormal weight gain: Secondary | ICD-10-CM | POA: Diagnosis not present

## 2024-05-24 DIAGNOSIS — R140 Abdominal distension (gaseous): Secondary | ICD-10-CM | POA: Diagnosis not present

## 2024-05-27 ENCOUNTER — Telehealth: Payer: Self-pay | Admitting: Internal Medicine

## 2024-05-27 NOTE — Telephone Encounter (Signed)
 Lmom and sent mychart message requesting return call

## 2024-05-27 NOTE — Telephone Encounter (Signed)
 Reviewed breath test from [facility].  Study is negative.  Still waiting on fecal fat and elastase results.  For now, FODMAP diet.  I fully agree with Josette that a course of Xifaxan  may be her best option.  Please let her know.

## 2024-05-27 NOTE — Telephone Encounter (Signed)
 Pt was made aware and verbalized understanding.

## 2024-06-05 ENCOUNTER — Encounter: Payer: Self-pay | Admitting: Neurology

## 2024-06-05 ENCOUNTER — Ambulatory Visit (INDEPENDENT_AMBULATORY_CARE_PROVIDER_SITE_OTHER): Admitting: Neurology

## 2024-06-05 VITALS — BP 112/72 | HR 81 | Ht 62.0 in | Wt 183.8 lb

## 2024-06-05 DIAGNOSIS — G4733 Obstructive sleep apnea (adult) (pediatric): Secondary | ICD-10-CM | POA: Diagnosis not present

## 2024-06-05 DIAGNOSIS — H538 Other visual disturbances: Secondary | ICD-10-CM | POA: Diagnosis not present

## 2024-06-05 DIAGNOSIS — R42 Dizziness and giddiness: Secondary | ICD-10-CM

## 2024-06-05 DIAGNOSIS — H81399 Other peripheral vertigo, unspecified ear: Secondary | ICD-10-CM

## 2024-06-05 NOTE — Progress Notes (Signed)
 Subjective:    Patient ID: Tonya Love is a 48 y.o. Love.  HPI    True Mar, MD, PhD [facility] [address] [address] [place], [place] [address]  Tonya Love is a 48 year old right-handed woman with an underlying medical history of reflux disease, anxiety, depression, history of pancreatitis, chronic diarrhea, migraine headaches, sleep apnea, and obesity, who presents for a new problem visit of dizziness.  The patient is referred by her primary care physician, Tonya Love.  She has been followed in our clinic for her migraine headaches for the past few years.  She was last seen in this clinic by Tonya Love, at which time she was on Ajovy  injections and gabapentin  as well as Zofran  and Maxalt  as needed.  Today, 9/17/Love: She reports intermittent episodes of lightheadedness, sometimes she feels like the room is spinning and sometimes she feels the balance is off.  She has not actually fallen.  First episode happened in March Love when she was turning in bed and she felt that the room was spinning.  Symptoms lasted for about 2 to 3 hours.  She has had intermittent episodes since then, no ringing in her ears but has had intermittent blurry vision, fairly consistently blurry when she tries to read writing in bright colors or white on her dark background.  She has not had an eye examination in years.  She was treated with a round of prednisone  by her PCP back in May.  She has tried meclizine  which has not helped.  She had a brain MRI with and without contrast in December 2020 which was benign.  She had a home sleep test through [facility] in 2023 which showed mild obstructive sleep apnea.  Her AHI was 5.6/h, O2 nadir 88%.  I reviewed her home sleep test report from study dated 11/30/2021. She consulted with dentistry for an oral appliance but did not get an oral appliance.  She has been on PAP therapy and reports compliance with  her PAP machine but has not seen her pulmonology provider in over a year. She limits her caffeine to 1 can of soda per day.  She has started taking vitamin B12 as it was on the lower end of normal recently.  She does not drink any alcohol.  She had a brain MRI wo contrast through [facility] on 01/30/24 and I reviewed the results:   IMPRESSION:  Negative brain MRI without contrast.   She has not seen ENT.   She reports intermittent blurry vision.  She has seen cardiology for lightheadedness and palpitation.  I reviewed the office visit note with Dr. Victor from 7/7/Love.  She had a heart monitor in November 2022 which was benign.  She had a repeat heart monitor in October 2024 which was benign.  She reported occasional palpitations and shortness of breath.  She reported having been diagnosed with vertigo and tried meclizine .  The patient's allergies, current medications, family history, past medical history, past social history, past surgical history and problem list were reviewed and updated as appropriate.    Previously:   2/20/Love Tonya Love): <<Tonya Love is a 48 y.o. Love with a history of migraine headaches. Returns today for follow-up.  She is doing well on Ajovy  and gabapentin .  She states that she typically only gets headaches when her next injection is due.  Continues on rizatriptan .  Reports that it typically works well.>>   10/20/22 (MM): <<  Tonya Love with a history of Migraine headaches. Returns today for follow-up.  Overall she feels that she is doing well.  Continues on Ajovy  and gabapentin  for migraines.  She states occasionally she will get a stress headache.  Reports that she consistently takes over-the-counter medication with good benefit.  In the past she has been on Maxalt  but states her prescription ran out so she has not used this in a while.>>   10/27/21 (MM): << Tonya Love is a 48 year old Love with a history of migraine  headaches. She returns today for follow-up. She is currently taking Ajovy  and gabapentin .  She states with the addition of gabapentin  she is only had 1 headache this month.  States that she is not having to use Maxalt .  Denies any new symptoms.  Returns today for an evaluation. >>   04/20/21 (MM): << Tonya Love is a 48 year old Love with a history of migraine headaches.  She continues on Ajovy  and Maxalt .  She reports that she is 2 weeks behind on Ajovy  due to prior authorization.  She states that she is having sharp shooting pain in the temporal regions and across the forehead.  She reports some blurry vision when she has these episodes.  Reports that they are very brief and only last for seconds.  This is only been occurring in the last month.>>     05/05/20 (MM): << Tonya Love is a 48 year old Love with a history of migraine. She returns today for follow-up. She remains on Ajovy .  She reports that this controls her headache.  She typically gets headaches the week before the next dose is due.  She states that her headaches typically occur across the forehead.  She does have photophobia and phonophobia.  On occasion she will have nausea.  She reports that Maxalt  continues to give her good benefit.  On occasion she will have to take a second dose. >>  11/04/2019 (SA): I first met her on 08/05/2019 at the request of her primary care physician, at which time she reported a longstanding history of migraine headaches since her 55s.  She had tried Imitrex in the past.  I suggested a trial of Maxalt .  We talked about potentially utilizing preventative medications she was advised to proceed with a sleep study to rule out underlying obstructive sleep apnea and a brain MRI.  She had a brain MRI with and without contrast on 09/03/2019 and I reviewed the results: IMPRESSION:    Normal MRI brain (with and without).    We called her with her test results.  Unfortunately, her insurance denied a laboratory sleep  study as well as a home sleep test.   She reports that the Maxalt  helps, some days better than others, she needs a prescription for Zofran  as she is out.  Her migraine frequency has increased a little bit, she can have 2 or 3 migraines per week, they can last 24 to 36 hours.  She has not been on preventative medication in the past but has tried as needed medication in the past.  She has had some intermittent palpitations and chest discomfort, also jitteriness.  This is not after taking Maxalt  actually.  It can be when she is just sitting and she feels unwell, she has noticed on her health tracker watch that her pulse rate can go up to the 150s, yesterday she had a similar episode in her pulse rate was 158.  She has  not seen her primary care physician or nurse practitioner yet for this.  She has not seen a cardiologist in the past.  She has not seen a correlation between her caffeine intake and her symptoms and also she had not skipped any meals.  She tries to hydrate well with water , drinks caffeine in the form of [facility] occasionally, not every day, does not like coffee.      08/05/2019: (She) reports recurrent migrainous headaches for years, probably since her 30s.  In the past, she had seen a neurologist, she also reports having had an MRI done over 2 decades ago which at that time was normal.  She has tried Imitrex in the past, she also tried 2 other as needed medications as she recalls but she does not actually remember the names.  It does not sound like that she was on daily preventative medication in the past.  Her migraines are typically left frontal and frontotemporal, throbbing, associated with nausea, rare vomiting, associated with photophobia, some sonophobia as well.  She remembers that Imitrex was helpful.  She also has some morning headaches which at times are less intense, more dull and achy.  She reports snoring and daytime somnolence.  She has nocturia about once or twice per average  night.  She has not had an eye examination in over 2 years and sometimes has blurry vision.  She has no associated neurological accompaniments with her migraine headaches including no one-sided weakness, numbness, tingling, droopy face or slurring of speech.  She does not always get enough sleep she admits.  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  She is a non-smoker and drinks alcohol rarely, she drinks caffeine in the form of [facility], 1-1/2 bottles per day, typically 20 ounce bottle.  She admits that she does not typically drink enough water .  She does not like to drink water .  For nausea, she has tried Zofran  in the past and has a prescription for Phenergan  through GI. I reviewed your office note from 06/18/2019. She lives alone, she is separated.  She has 2 children, daughter age 57, son age 28, her daughter has a 70-year-old daughter.  The patient has woken up with a sense of gasping for air at times. Her Epworth sleepiness score is 12 out of 24 today, fatigue severity score is 53 out of 63.  Her migraine headache frequency is about once a week currently.     Her Past Medical History Is Significant For: Past Medical History:  Diagnosis Date   Anxiety    Chronic diarrhea    Depression    GERD (gastroesophageal reflux disease)    Helicobacter pylori gastritis 10/2021   Treated with generic Prevpac, then treated with levofloxacin , amoxicillin , and PPI BID (July 2023)   Migraines    Pancreatitis    Sleep apnea     Her Past Surgical History Is Significant For: Past Surgical History:  Procedure Laterality Date   BIOPSY  11/06/2019   Procedure: BIOPSY;  Surgeon: Shaaron Lamar HERO, MD;  [facility];  Service: Endoscopy;;   BIOPSY  11/12/2021   Procedure: BIOPSY;  Surgeon: Cindie Carlin POUR, DO;  [facility];  Service: Endoscopy;;   BIOPSY  02/10/Love   Procedure: BIOPSY;  Surgeon: Shaaron Lamar HERO, MD;  [facility];  Service: Endoscopy;;   CHOLECYSTECTOMY     COLONOSCOPY N/A 11/06/2019   Procedure: COLONOSCOPY;  Surgeon: Shaaron Lamar HERO, diverticulosis in the sigmoid colon, minimal grade 2 hemorrhoids, otherwise normal.  Segmental biopsies benign.  Repeat colonoscopy in 10 years.   ESOPHAGOGASTRODUODENOSCOPY N/A 11/06/2019   Procedure: ESOPHAGOGASTRODUODENOSCOPY (EGD);  Surgeon: Shaaron Lamar HERO, MD; mild Schatzki's ring s/p dilation, abnormal distal esophageal mucosa with benign biopsies, small hiatal hernia, otherwise normal.   ESOPHAGOGASTRODUODENOSCOPY (EGD) WITH PROPOFOL  N/A 11/12/2021   Surgeon: Cindie Carlin POUR, DO; small hiatal hernia, mild Schatzki's ring dilated, esophageal biopsies with reflux changes, H. pylori gastritis.  Treated with Prevpac.   ESOPHAGOGASTRODUODENOSCOPY (EGD) WITH PROPOFOL  N/A 02/10/Love   Surgeon: Shaaron Lamar HERO, MD; Mild Schatzki's ring dilated, small hiatal hernia, normal examined duodenum biopsy.  Duodenal biopsies benign.   MALONEY DILATION N/A 11/06/2019   Procedure: AGAPITO DILATION;  Surgeon: Shaaron Lamar HERO, MD;  [facility];  Service: Endoscopy;  Laterality: N/A;   MALONEY DILATION N/A 02/10/Love   Procedure: AGAPITO DILATION;  Surgeon: Shaaron Lamar HERO, MD;  [facility];  Service: Endoscopy;  Laterality: N/A;  8:30 am, asa 2   TUBAL LIGATION      Her Family History Is Significant For: Family History  Problem Relation Age of Onset   Hypertension Mother    Cancer Mother    Alcohol abuse Father    Alcohol abuse Paternal Grandfather    Depression Maternal Aunt    Depression Paternal Aunt    Colon cancer Neg Hx    Pancreatic cancer Neg Hx     Her Social History Is Significant For: Social History   Socioeconomic History   Marital status: Single    Spouse name: Not on file   Number of children: Not on file   Years of education: Not on file   Highest education level: Not on file  Occupational History   Not on file   Tobacco Use   Smoking status: Never    Passive exposure: Past   Smokeless tobacco: Never  Vaping Use   Vaping status: Never Used  Substance and Sexual Activity   Alcohol use: No   Drug use: No   Sexual activity: Not Currently  Other Topics Concern   Not on file  Social History Narrative   Grown son lives with her   Right handed   Caffeine: 1 soda/day   Social Drivers of Corporate investment banker Strain: Low Risk  (06/26/2018)   Received from [facility]   Overall Financial Resource Strain (CARDIA)    Difficulty of Paying Living Expenses: Not hard at all  Food Insecurity: Unknown (06/27/2023)   [facility]   Hunger Vital Sign    Worried About Running Out of Food in the Last Year: Not on file    Within the past 12 months, the food you bought just didn't last and you didn't have money to get more.: Never true  Transportation Needs: No Transportation Needs (06/27/2023)   Received from [facility]   PRAPARE - Transportation    Lack of Transportation (Medical): No    Lack of Transportation (Non-Medical): No  Physical Activity: Inactive (06/26/2018)   Received from [facility]   Exercise Vital Sign    Days of Exercise per Week: 0 days    Minutes of Exercise per Session: 0 min  Stress: Not on file  Social Connections: Not on file    Her Allergies Are:  Allergies  Allergen Reactions   Codeine Nausea Only  :   Her Current Medications Are:  Outpatient Encounter Medications as of 9/17/Love  Medication Sig   acyclovir ointment (ZOVIRAX) 5 % Apply 1 Application topically every 3 (three) hours.   cholestyramine  (QUESTRAN ) 4 g packet Take 1 packet (4 g total) by mouth daily before lunch.   cyanocobalamin (VITAMIN B12) 1000 MCG tablet Take 1,000 mcg by mouth daily.   ferrous sulfate 325 (65 FE) MG tablet Take 325 mg by mouth daily with breakfast.   Fremanezumab -vfrm (AJOVY ) 225 MG/1.5ML SOAJ INJECT 1 PEN INTO THE SKIN EVERY 30 DAYS   gabapentin   (NEURONTIN ) 100 MG capsule Take 1 capsule (100 mg total) by mouth 2 (two) times daily. TAKE 1 CAPSULE(100 MG) BY MOUTH TWICE DAILY   ondansetron  (ZOFRAN ) 4 MG tablet Take 1 tablet (4 mg total) by mouth every 8 (eight) hours as needed for nausea or vomiting.   pantoprazole  (PROTONIX ) 40 MG tablet Take 1 tablet (40 mg total) by mouth 2 (two) times daily.   rizatriptan  (MAXALT ) 10 MG tablet Take 1 tablet at the onset of migraine. May repeat in 2 hours if needed   valACYclovir (VALTREX) 500 MG tablet Take 500 mg by mouth daily as needed.   No facility-administered encounter medications on file as of 9/17/Love.  :   Review of Systems:  Out of a complete 14 point review of systems, all are reviewed and negative with the exception of these symptoms as listed below:   Review of Systems  Neurological:        C/o vertigo 3/Love. Has been to cardiology (negative w/u/ new pcp (gabapentin /prednisone / GI did labs B12 low, did not check TSH.  No change in hearing. Noted some headaches (but related to due ajovy ).    Objective:  Neurological Exam  Physical Exam Physical Examination:   Vitals:   06/05/24 0952  BP: 112/72  Pulse: 81  No orthostatic hypotension.  No orthostatic lightheadedness or vertiginous symptoms.  General Examination: The patient is a very pleasant 48 y.o. Love in no acute distress. She appears well-developed and well-nourished and well groomed.   HEENT: Normocephalic, atraumatic, pupils are equal, round and reactive to light, extraocular tracking is good without limitation to gaze excursion or nystagmus noted. Hearing is grossly intact.  Clear tympanic membranes bilaterally.  Face is symmetric with normal facial animation and normal facial sensation to light touch, temperature and vibration sense. Speech is clear with no dysarthria noted. There is no hypophonia.  No vertiginous symptoms with head position changes but feels slightly lightheaded as she looks up.  There is no lip,  neck/head, jaw or voice tremor. Neck is supple with full range of passive and active motion. There are no carotid bruits on auscultation. Oropharynx exam reveals: mild mouth dryness, tongue protrudes centrally and palate elevates symmetrically.    Chest: Clear to auscultation without wheezing, rhonchi or crackles noted.  Heart: S1+S2+0, regular and normal without murmurs, rubs or gallops noted.   Abdomen: Soft, non-tender and non-distended.  Extremities: There is trace pitting edema in the distal lower extremities bilaterally.   Skin: Warm and dry without trophic changes noted.   Musculoskeletal: exam reveals no obvious joint deformities.   Neurologically:  Mental status: The patient is awake, alert and oriented in all 4 spheres. Her immediate and remote memory, attention, language skills and fund of knowledge are appropriate. There is no evidence of aphasia, agnosia, apraxia or anomia. Speech is clear with normal prosody and enunciation. Thought process is linear. Mood is normal and affect is normal.  Cranial nerves II - XII  are as described above under HEENT exam.  Motor exam: Normal bulk, strength and tone is noted. There is no obvious action or resting tremor.  Fine motor skills and coordination: grossly intact, intact finger taps, hand movements and rapid alternating patting in both upper extremities, normal foot taps bilaterally in the lower extremities. Cerebellar testing: No dysmetria or intention tremor. There is no truncal or gait ataxia.  Normal finger-to-nose, normal heel-to-shin. Sensory exam: intact to light touch, temperature and vibration sense in the upper and lower extremities.  Gait, station and balance: She stands easily. No veering to one side is noted. No leaning to one side is noted. Posture is age-appropriate and stance is narrow based. Gait shows normal stride length and normal pace. No problems turning are noted.   Assessment and Plan:  In summary, KEYNA BLIZARD  is a very pleasant 48 y.o.-year old Love with an underlying medical history of reflux disease, anxiety, depression, history of pancreatitis, chronic diarrhea, migraine headaches, sleep apnea, and obesity, who presents for evaluation of her intermittent dizziness of several months duration.  Her neurological exam is nonfocal and she is largely reassured today.   Below is a summary of my recommendations and our discussion points from today's visit, based on chart review, history and examination. They were given these instructions verbally during the visit in detail and also in writing in the MyChart after visit summary (AVS), which they can access electronically. <<  We will investigate things further with a brain and neck MR angiogram.  We will call you with the test results.  Please talk to your primary care about a referral to ENT.   Establish with any optometrist or ophthalmologist of your choosing and get a full, dilated eye examination.   Try to hydrate well with water , try to drink 4 bottles of water  per day, 16.9 ounce size each.   Limit your caffeine as you are.   Continue with your AutoPap or CPAP machine but schedule a follow-up with your pulmonology provider as you have not seen them in over a year.  You may need an adjustment in your settings.   Your neurological exam is nonfocal and reassuring.   Please follow-up as scheduled with Megan.   >>     I spent 45 minutes in total face-to-face time and in reviewing records during pre-charting, more than 50% of which was spent in counseling and coordination of care, reviewing test results, reviewing medications and treatment regimen and/or in discussing or reviewing the diagnosis of intermittent dizziness, the prognosis and treatment options. Pertinent laboratory and imaging test results that were available during this visit with the patient were reviewed by me and considered in my medical decision making (see chart for details).

## 2024-06-05 NOTE — Patient Instructions (Signed)
 Unfortunately, dizziness is a very common complaint but is often not due to a primary neurological reason or single underlying medical problem. Often, there a combination of factors, that result in dizziness. This includes blood pressure fluctuations, medication side effects, blood sugar fluctuations, stress, vertigo, poor sleep with sleep deprivation, dehydration, and electrolyte disturbance or other metabolic and endocrinological reasons, meaning hormone related problems such as thyroid  dysfunction.  Based on your history, your exam and our discussion today, these are my recommendations for you:  We will investigate things further with a brain and neck MR angiogram.  We will call you with the test results.  Please talk to your primary care about a referral to ENT.   Establish with any optometrist or ophthalmologist of your choosing and get a full, dilated eye examination.   Try to hydrate well with water , try to drink 4 bottles of water  per day, 16.9 ounce size each.   Limit your caffeine as you are.   Continue with your AutoPap or CPAP machine but schedule a follow-up with your pulmonology provider as you have not seen them in over a year.  You may need an adjustment in your settings.   Your neurological exam is nonfocal and reassuring.   Please follow-up as scheduled with Megan.

## 2024-06-12 ENCOUNTER — Ambulatory Visit (INDEPENDENT_AMBULATORY_CARE_PROVIDER_SITE_OTHER): Admitting: Orthopedic Surgery

## 2024-06-12 ENCOUNTER — Encounter: Payer: Self-pay | Admitting: Orthopedic Surgery

## 2024-06-12 ENCOUNTER — Other Ambulatory Visit: Payer: Self-pay

## 2024-06-12 VITALS — Ht 62.0 in | Wt 188.0 lb

## 2024-06-12 DIAGNOSIS — M25562 Pain in left knee: Secondary | ICD-10-CM

## 2024-06-12 NOTE — Progress Notes (Signed)
 Orthopaedic Clinic Return  Assessment: Tonya Love is a 48 y.o. female with the following: Left knee pain   Plan: Tonya Love has pain over the anteromedial aspect of the left knee.  Pain started after standing up from a deep seated position.  She does have some radiating pains distally.  No history of an injury in the left knee.  She reports a mild amount of swelling.  Radiographs demonstrates a pretty healthy knee overall.  There does appear to be some mild loss of joint space in osteophytes within the patellofemoral compartment.  This could be contributing to her ongoing symptoms at this time.  Recommended continued use of NSAIDs, and can increase the dose and frequency.  Exercise and strengthening can be helpful.  I provided some exercises for her to initiate.  Can consider physical therapy.  We can also talk about an injection in the future.  Follow-up: Return if symptoms worsen or fail to improve.   Subjective:  Chief Complaint  Patient presents with   Knee Pain    L pt states she stood up approx 1 mo and felt severe pain in and she had to stand there for a few then pain subsided incident happened again approx 2 wks later but this time the pain was worse and knee still has some soreness to it.     History of Present Illness: Tonya Love is a 48 y.o. female who returns to clinic for evaluation of left knee pain.  She reports that she stood up from a seated position about a month ago while at work, and had severe pain in the anterior and medial aspect of the left knee.  The pain improved rather quickly, but she had a similar type episode a couple of weeks later, and this was more painful.  She has been taking ibuprofen, twice a day.  Typically this is 400 mg.  No history of an injury to the left knee.  No injections.  She has not worked with therapy.  Review of Systems: No fevers or chills No numbness or tingling No chest pain No shortness of breath No bowel or bladder  dysfunction No GI distress No headaches   Objective: Ht 5' 2 (1.575 m)   Wt 188 lb (85.3 kg)   BMI 34.39 kg/m   Physical Exam:  Alert and oriented.  No acute distress.  Left-sided antalgic gait  Left knee without swelling.  No bruising.  No redness.  No crepitus with range of motion testing.  She has good range of motion.  No increased laxity varus valgus stress.  Negative Lachman.  IMAGING: I personally ordered and reviewed the following images:  X-rays of the left knee were obtained in clinic today.  No acute injuries noted.  Neutral overall alignment.  Well-maintained joint space within the medial lateral compartment.  Slight loss of joint space within the patellofemoral compartment, also with some small osteophytes.  No bony lesions.  Impression: Left knee x-rays with mild patellofemoral compartment arthritis   Tonya DELENA Horde, MD 06/12/2024 1:45 PM

## 2024-06-12 NOTE — Patient Instructions (Signed)
 Knee Exercises  Ask your health care provider which exercises are safe for you. Do exercises exactly as told by your health care provider and adjust them as directed. It is normal to feel mild stretching, pulling, tightness, or discomfort as you do these exercises. Stop right away if you feel sudden pain or your pain gets worse. Do not begin these exercises until told by your health care provider.  Stretching and range-of-motion exercises These exercises warm up your muscles and joints and improve the movement and flexibility of your knee. These exercises also help to relieve pain and swelling.  Knee extension, prone Lie on your abdomen (prone position) on a bed. Place your left / right knee just beyond the edge of the surface so your knee is not on the bed. You can put a towel under your left / right thigh just above your kneecap for comfort. Relax your leg muscles and allow gravity to straighten your knee (extension). You should feel a stretch behind your left / right knee. Hold this position for 10 seconds. Scoot up so your knee is supported between repetitions. Repeat 10 times. Complete this exercise 3-4 times per week.     Knee flexion, active Lie on your back with both legs straight. If this causes back discomfort, bend your left / right knee so your foot is flat on the floor. Slowly slide your left / right heel back toward your buttocks. Stop when you feel a gentle stretch in the front of your knee or thigh (flexion). Hold this position for 10 seconds. Slowly slide your left / right heel back to the starting position. Repeat 10 times. Complete this exercise 3-4 times per week.      Quadriceps stretch, prone Lie on your abdomen on a firm surface, such as a bed or padded floor. Bend your left / right knee and hold your ankle. If you cannot reach your ankle or pant leg, loop a belt around your foot and grab the belt instead. Gently pull your heel toward your buttocks. Your knee should  not slide out to the side. You should feel a stretch in the front of your thigh and knee (quadriceps). Hold this position for 10 seconds. Repeat 10 times. Complete this exercise 3-4 times per week.      Hamstring, supine Lie on your back (supine position). Loop a belt or towel over the ball of your left / right foot. The ball of your foot is on the walking surface, right under your toes. Straighten your left / right knee and slowly pull on the belt to raise your leg until you feel a gentle stretch behind your knee (hamstring). Do not let your knee bend while you do this. Keep your other leg flat on the floor. Hold this position for 10 seconds. Repeat 10 times. Complete this exercise 3-4 times per week.   Strengthening exercises These exercises build strength and endurance in your knee. Endurance is the ability to use your muscles for a long time, even after they get tired.  Quadriceps, isometric This exercise stretches the muscles in front of your thigh (quadriceps) without moving your knee joint (isometric). Lie on your back with your left / right leg extended and your other knee bent. Put a rolled towel or small pillow under your knee if told by your health care provider. Slowly tense the muscles in the front of your left / right thigh. You should see your kneecap slide up toward your hip or see increased dimpling  just above the knee. This motion will push the back of the knee toward the floor. For 10 seconds, hold the muscle as tight as you can without increasing your pain. Relax the muscles slowly and completely. Repeat 10 times. Complete this exercise 3-4 times per week. .     Straight leg raises This exercise stretches the muscles in front of your thigh (quadriceps) and the muscles that move your hips (hip flexors). Lie on your back with your left / right leg extended and your other knee bent. Tense the muscles in the front of your left / right thigh. You should see your kneecap  slide up or see increased dimpling just above the knee. Your thigh may even shake a bit. Keep these muscles tight as you raise your leg 4-6 inches (10-15 cm) off the floor. Do not let your knee bend. Hold this position for 10 seconds. Keep these muscles tense as you lower your leg. Relax your muscles slowly and completely after each repetition. Repeat 10 times. Complete this exercise 3-4 times per week.  Hamstring, isometric Lie on your back on a firm surface. Bend your left / right knee about 30 degrees. Dig your left / right heel into the surface as if you are trying to pull it toward your buttocks. Tighten the muscles in the back of your thighs (hamstring) to dig as hard as you can without increasing any pain. Hold this position for 10 seconds. Release the tension gradually and allow your muscles to relax completely for __________ seconds after each repetition. Repeat 10 times. Complete this exercise 3-4 times per week.  Hamstring curls If told by your health care provider, do this exercise while wearing ankle weights. Begin with 5 lb weights. Then increase the weight by 1 lb (0.5 kg) increments. You can also use an exercise band Lie on your abdomen with your legs straight. Bend your left / right knee as far as you can without feeling pain. Keep your hips flat against the floor. Hold this position for 10 seconds. Slowly lower your leg to the starting position. Repeat 10 times. Complete this exercise 3-4 times per week.      Squats This exercise strengthens the muscles in front of your thigh and knee (quadriceps). Stand in front of a table, with your feet and knees pointing straight ahead. You may rest your hands on the table for balance but not for support. Slowly bend your knees and lower your hips like you are going to sit in a chair. Keep your weight over your heels, not over your toes. Keep your lower legs upright so they are parallel with the table legs. Do not let your hips  go lower than your knees. Do not bend lower than told by your health care provider. If your knee pain increases, do not bend as low. Hold the squat position for 10 seconds. Slowly push with your legs to return to standing. Do not use your hands to pull yourself to standing. Repeat 10 times. Complete this exercise 3-4 times per week .     Wall slides This exercise strengthens the muscles in front of your thigh and knee (quadriceps). Lean your back against a smooth wall or door, and walk your feet out 18-24 inches (46-61 cm) from it. Place your feet hip-width apart. Slowly slide down the wall or door until your knees bend 90 degrees. Keep your knees over your heels, not over your toes. Keep your knees in line with your hips. Hold  this position for 10 seconds. Repeat 10 times. Complete this exercise 3-4 times per week.      Straight leg raises This exercise strengthens the muscles that rotate the leg at the hip and move it away from your body (hip abductors). Lie on your side with your left / right leg in the top position. Lie so your head, shoulder, knee, and hip line up. You may bend your bottom knee to help you keep your balance. Roll your hips slightly forward so your hips are stacked directly over each other and your left / right knee is facing forward. Leading with your heel, lift your top leg 4-6 inches (10-15 cm). You should feel the muscles in your outer hip lifting. Do not let your foot drift forward. Do not let your knee roll toward the ceiling. Hold this position for 10 seconds. Slowly return your leg to the starting position. Let your muscles relax completely after each repetition. Repeat 10 times. Complete this exercise 3-4 times per week.      Straight leg raises This exercise stretches the muscles that move your hips away from the front of the pelvis (hip extensors). Lie on your abdomen on a firm surface. You can put a pillow under your hips if that is more  comfortable. Tense the muscles in your buttocks and lift your left / right leg about 4-6 inches (10-15 cm). Keep your knee straight as you lift your leg. Hold this position for 10 seconds. Slowly lower your leg to the starting position. Let your leg relax completely after each repetition. Repeat 10 times. Complete this exercise 3-4 times per week.   We have discussed taking over the counter medications for your pain.  Below are some common medicines to consider.  Also listed are the recommended doses and how to take these medications as a prescription strength medicine.  If you experience any issues or side effects, please discontinue the medicine and contact the office for some assistance.   Tylenol  or acetaminophen  - can be used to help treat minor pains and fevers or chills.  Common doses include 350 mg, 500 mg and 650 mg.  Following surgery, I recommend taking 1000 mg of this medication three times a day.  Some narcotic pain medications contain acetaminophen , so you cannot take additional acetaminophen . This medicine can be bad for your liver.  DO NOT TAKE MORE THAN 4000 mg per day.  Advil, Motrin or Ibuprofen - anti-inflammatory medicines.  Can be used to treat chronic pain and help with swelling.  Common over the counter dose includes 200 mg.  Bottle states you can take 1-2 pills.  Common prescription doses include 600 mg or 800 mg pills.  This medication can be hard on your stomach and your kidneys.  If you have a history of stomach ulcers or kidney issues, you should discuss this with your PCP.  This medicine can be taken 3 times per day.  You should take this medication with food in your stomach.   Aleve  or Naproxen  - anti inflammatory medicines.  Can be used to treat chronic pain and help with swelling. Common over the counter dose is 220 mg.  The bottle states you can take 1 pill, twice a day.   Typical prescription dose is 500 mg two times a day.  This medication can be hard on your stomach  and your kidneys.  If you have a history of stomach ulcers or kidney issues, you should discuss this with your PCP.  This medicine can be taken 2 times per day.  You should take this medication with food in your stomach.    Acetaminopen and ibuprofen/naproxen  can be taking at the same time, but please do not take ibuprofen and naproxen  at the same time.  These medications work the same way and can cause further injury to your stomach and/or kidneys.    Patients taking a blood thinner are often encouraged not to take NSAIDs or medications like ibuprofen or naproxen  because they increase the possibility of internal bleeding.  If you have any questions, you should contact the doctor who recommended these medicines.

## 2024-06-18 ENCOUNTER — Ambulatory Visit (INDEPENDENT_AMBULATORY_CARE_PROVIDER_SITE_OTHER)

## 2024-06-18 DIAGNOSIS — G4733 Obstructive sleep apnea (adult) (pediatric): Secondary | ICD-10-CM

## 2024-06-18 DIAGNOSIS — H538 Other visual disturbances: Secondary | ICD-10-CM | POA: Diagnosis not present

## 2024-06-18 DIAGNOSIS — R42 Dizziness and giddiness: Secondary | ICD-10-CM | POA: Diagnosis not present

## 2024-06-18 DIAGNOSIS — H81399 Other peripheral vertigo, unspecified ear: Secondary | ICD-10-CM

## 2024-06-18 MED ORDER — GADOBENATE DIMEGLUMINE 529 MG/ML IV SOLN
17.0000 mL | Freq: Once | INTRAVENOUS | Status: AC | PRN
Start: 2024-06-18 — End: 2024-06-18
  Administered 2024-06-18: 17 mL via INTRAVENOUS

## 2024-06-24 ENCOUNTER — Ambulatory Visit: Payer: Self-pay | Admitting: Neurology

## 2024-07-16 ENCOUNTER — Ambulatory Visit (INDEPENDENT_AMBULATORY_CARE_PROVIDER_SITE_OTHER): Admitting: Internal Medicine

## 2024-07-16 ENCOUNTER — Encounter: Payer: Self-pay | Admitting: Internal Medicine

## 2024-07-16 VITALS — BP 111/71 | HR 70 | Temp 98.3°F | Ht 62.0 in | Wt 181.8 lb

## 2024-07-16 DIAGNOSIS — K580 Irritable bowel syndrome with diarrhea: Secondary | ICD-10-CM

## 2024-07-16 DIAGNOSIS — R197 Diarrhea, unspecified: Secondary | ICD-10-CM

## 2024-07-16 MED ORDER — HYOSCYAMINE SULFATE ER 0.375 MG PO TB12: 0.3750 mg | ORAL_TABLET | Freq: Two times a day (BID) | ORAL | 3 refills | Status: AC

## 2024-07-16 NOTE — Progress Notes (Signed)
 Gastroenterology Progress Note    Primary Care Physician:  Orpha Yancey LABOR, MD Primary Gastroenterologist:  Dr. Shaaron  Pre-Procedure History & Physical: HPI:  Tonya Love is a 48 y.o. female here for back for evaluation postprandial abdominal cramps nonbloody watery diarrhea.  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  Stool for fat and elastase not completed as of yet.  Patient could not afford Xifaxan  SIBO assay negative at [facility]  GERD well-controlled on twice daily Protonix .  Wonders he can drop back to once daily.  Weight is stable actually up 2 pounds since her last visit.  Been on Questran  4 g daily backed off to one half of packet due to some issues with constipation.  Does not seem that helps with her diarrhea much these days.  Past Medical History:  Diagnosis Date   Anxiety    Chronic diarrhea    Depression    GERD (gastroesophageal reflux disease)    Helicobacter pylori gastritis 10/2021   Treated with generic Prevpac, then treated with levofloxacin , amoxicillin , and PPI BID (July 2023)   Migraines    Pancreatitis    Sleep apnea     Past Surgical History:  Procedure Laterality Date   BIOPSY  11/06/2019   Procedure: BIOPSY;  Surgeon: Shaaron Lamar HERO, MD;  [facility];  Service: Endoscopy;;   BIOPSY  11/12/2021   Procedure: BIOPSY;  Surgeon: Cindie Carlin POUR, DO;  [facility];  Service: Endoscopy;;   BIOPSY  10/30/2023   Procedure: BIOPSY;  Surgeon: Shaaron Lamar HERO, MD;  [facility]: [facility];  Service: Endoscopy;;   CHOLECYSTECTOMY     COLONOSCOPY N/A 11/06/2019   Procedure: COLONOSCOPY;  Surgeon: Shaaron Lamar HERO, diverticulosis in the sigmoid colon, minimal grade 2 hemorrhoids, otherwise normal.  Segmental biopsies benign.  Repeat colonoscopy in 10 years.   ESOPHAGOGASTRODUODENOSCOPY N/A 11/06/2019   Procedure: ESOPHAGOGASTRODUODENOSCOPY (EGD);  Surgeon: Shaaron Lamar HERO, MD; mild Schatzki's ring s/p dilation,  abnormal distal esophageal mucosa with benign biopsies, small hiatal hernia, otherwise normal.   ESOPHAGOGASTRODUODENOSCOPY (EGD) WITH PROPOFOL  N/A 11/12/2021   Surgeon: Cindie Carlin POUR, DO; small hiatal hernia, mild Schatzki's ring dilated, esophageal biopsies with reflux changes, H. pylori gastritis.  Treated with Prevpac.   ESOPHAGOGASTRODUODENOSCOPY (EGD) WITH PROPOFOL  N/A 10/30/2023   Surgeon: Shaaron Lamar HERO, MD; Mild Schatzki's ring dilated, small hiatal hernia, normal examined duodenum biopsy.  Duodenal biopsies benign.   MALONEY DILATION N/A 11/06/2019   Procedure: AGAPITO DILATION;  Surgeon: Shaaron Lamar HERO, MD;  [facility]: [facility];  Service: Endoscopy;  Laterality: N/A;   MALONEY DILATION N/A 10/30/2023   Procedure: AGAPITO DILATION;  Surgeon: Shaaron Lamar HERO, MD;  [facility]: [facility];  Service: Endoscopy;  Laterality: N/A;  8:30 am, asa 2   TUBAL LIGATION      Prior to Admission medications   Medication Sig Start Date End Date Taking? Authorizing Provider  acyclovir ointment (ZOVIRAX) 5 % Apply 1 Application topically every 3 (three) hours.   Yes [provider]  cholestyramine  (QUESTRAN ) 4 g packet Take 1 packet (4 g total) by mouth daily before lunch. 02/01/24  Yes Rudy Josette RAMAN, PA-C  cyanocobalamin (VITAMIN B12) 1000 MCG tablet Take 1,000 mcg by mouth daily.   Yes [provider]  ferrous sulfate 325 (65 FE) MG tablet Take 325 mg by mouth daily with breakfast.   Yes [provider]  Fremanezumab -vfrm (AJOVY ) 225 MG/1.5ML SOAJ INJECT 1 PEN INTO THE SKIN EVERY  30 DAYS 11/09/23  Yes Millikan, Megan, NP  gabapentin  (NEURONTIN ) 100 MG capsule Take 1 capsule (100 mg total) by mouth 2 (two) times daily. TAKE 1 CAPSULE(100 MG) BY MOUTH TWICE DAILY 11/09/23  Yes Millikan, Megan, NP  ondansetron  (ZOFRAN ) 4 MG tablet Take 1 tablet (4 mg total) by mouth every 8 (eight) hours as needed for nausea or vomiting. 10/06/23  Yes Rudy Josette RAMAN, PA-C   pantoprazole  (PROTONIX ) 40 MG tablet Take 1 tablet (40 mg total) by mouth 2 (two) times daily. 08/23/23  Yes Rudy Josette S, PA-C  rizatriptan  (MAXALT ) 10 MG tablet Take 1 tablet at the onset of migraine. May repeat in 2 hours if needed 11/09/23  Yes Millikan, Megan, NP  valACYclovir (VALTREX) 500 MG tablet Take 500 mg by mouth daily as needed.   Yes [provider]    Allergies as of 07/16/2024 - Review Complete 07/16/2024  Allergen Reaction Noted   Codeine Nausea Only 12/25/2014    Family History  Problem Relation Age of Onset   Hypertension Mother    Cancer Mother    Alcohol abuse Father    Alcohol abuse Paternal Grandfather    Depression Maternal Aunt    Depression Paternal Aunt    Colon cancer Neg Hx    Pancreatic cancer Neg Hx     Social History   Socioeconomic History   Marital status: Single    Spouse name: Not on file   Number of children: Not on file   Years of education: Not on file   Highest education level: Not on file  Occupational History   Not on file  Tobacco Use   Smoking status: Never    Passive exposure: Past   Smokeless tobacco: Never  Vaping Use   Vaping status: Never Used  Substance and Sexual Activity   Alcohol use: No   Drug use: No   Sexual activity: Not Currently  Other Topics Concern   Not on file  Social History Narrative   Grown son lives with her   Right handed   Caffeine: 1 soda/day   Social Drivers of Corporate Investment Banker Strain: Low Risk  (06/26/2018)   Received from [facility]   Overall Financial Resource Strain (CARDIA)    Difficulty of Paying Living Expenses: Not hard at all  Food Insecurity: Unknown (06/27/2023)   Received from [facility]   Hunger Vital Sign    Worried About Running Out of Food in the Last Year: Not on file    Within the past 12 months, the food you bought just didn't last and you didn't have money to get more.: Never true  Transportation Needs: No Transportation Needs  (06/27/2023)   Received from [facility]   PRAPARE - Transportation    Lack of Transportation (Medical): No    Lack of Transportation (Non-Medical): No  Physical Activity: Inactive (06/26/2018)   Received from [facility]   Exercise Vital Sign    Days of Exercise per Week: 0 days    Minutes of Exercise per Session: 0 min  Stress: Not on file  Social Connections: Not on file  Intimate Partner Violence: Not At Risk (06/27/2023)   Received from [facility]   Humiliation, Afraid, Rape, and Kick questionnaire    Within the last year, have you been afraid of your partner or ex-partner?: No    Within the last year, have you been humiliated or emotionally abused in other ways by your partner  or ex-partner?: No    Within the last year, have you been kicked, hit, slapped, or otherwise physically hurt by your partner or ex-partner?: No    Within the last year, have you been raped or forced to have any kind of sexual activity by your partner or ex-partner?: No    Review of Systems   See HPI, otherwise negative ROS  Physical Exam: BP 111/71 (BP Location: Right Arm, Patient Position: Sitting, Cuff Size: Normal)   Pulse 70   Temp 98.3 F (36.8 C) (Oral)   Ht 5' 2 (1.575 m)   Wt 181 lb 12.8 oz (82.5 kg)   LMP 07/12/2024   SpO2 97%   BMI 33.25 kg/m  General:   Alert,  Well-developed, well-nourished, pleasant and cooperative in NAD  Impression/Plan:   Pleasant 48 year old lady with postprandial abdominal cramps and diarrhea of several years duration extensive evaluation including celiac screen negative.  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  Could not afford Xifaxan .  Has not yet submitted stool studies to wrap-up the evaluation. 2 g Questran  daily seems to be suboptimal.  She needs a scheduled antimotility regimen.  Reflux well-controlled on twice daily Protonix .  She may well be able to de-escalate to once daily. Recommendations:  As discussed, you likely have IBS which is a  syndrome and not a disease.  Our goal is to manage her symptoms so you are good days out number your bad ones  There are couple of loose ends in your workup -   I would like you to submit the stool sample for analysis as previously recommended  Continue Questran  one half a packet daily  Add Levbid 1 tablet twice daily (dispense 60) with 3 refills.  This should decrease abdominal discomfort and urgency to have a bowel movement  Add probiotic such as digestive advantage for gas bloat once daily to help control your symptoms  You may be able to drop back on your Protonix  to 40 mg once daily in the morning before breakfast.  If you are having issues with breakthrough symptoms, go back to twice daily before breakfast and supper  I would like you to call in and let me know how you are doing in 1 month.  Recommendations are subject to adjustment.  Plan to see back in the office in 3 months  Notice: This dictation was prepared with Dragon dictation along with smaller phrase technology. Any transcriptional errors that result from this process are unintentional and may not be corrected upon review.

## 2024-07-16 NOTE — Addendum Note (Signed)
 Addended by: WELLINGTON MILLING on: 07/16/2024 08:49 AM   Modules accepted: Orders

## 2024-07-16 NOTE — Patient Instructions (Signed)
 Nice to see you again today  As discussed, you likely have IBS which is a syndrome and not a disease.  Our goal is to manage her symptoms so you are good days out number your bad ones  There are couple of loose ends in your workup -   I would like you to submit the stool sample for analysis as previously recommended  Continue Questran  one half a packet daily  Add Levbid 1 tablet twice daily (dispense 60) with 3 refills.  This should decrease abdominal discomfort and urgency to have a bowel movement  Add probiotic such as digestive advantage for gas bloat once daily to help control your symptoms  You may be able to drop back on your Protonix  to 40 mg once daily in the morning before breakfast.  If you are having issues with breakthrough symptoms, go back to twice daily before breakfast and supper  I would like you to call in and let me know how you are doing in 1 month.  Recommendations are subject to adjustment.  Plan to see back in the office in 3 months

## 2024-07-18 DIAGNOSIS — Z113 Encounter for screening for infections with a predominantly sexual mode of transmission: Secondary | ICD-10-CM | POA: Diagnosis not present

## 2024-07-18 DIAGNOSIS — R946 Abnormal results of thyroid function studies: Secondary | ICD-10-CM | POA: Diagnosis not present

## 2024-07-18 DIAGNOSIS — Z01419 Encounter for gynecological examination (general) (routine) without abnormal findings: Secondary | ICD-10-CM | POA: Diagnosis not present

## 2024-07-18 DIAGNOSIS — A6004 Herpesviral vulvovaginitis: Secondary | ICD-10-CM | POA: Diagnosis not present

## 2024-07-30 ENCOUNTER — Encounter: Payer: Self-pay | Admitting: Adult Health

## 2024-07-30 DIAGNOSIS — Z0000 Encounter for general adult medical examination without abnormal findings: Secondary | ICD-10-CM | POA: Diagnosis not present

## 2024-07-30 DIAGNOSIS — Z8639 Personal history of other endocrine, nutritional and metabolic disease: Secondary | ICD-10-CM | POA: Diagnosis not present

## 2024-07-30 DIAGNOSIS — Z1159 Encounter for screening for other viral diseases: Secondary | ICD-10-CM | POA: Diagnosis not present

## 2024-07-30 DIAGNOSIS — Z862 Personal history of diseases of the blood and blood-forming organs and certain disorders involving the immune mechanism: Secondary | ICD-10-CM | POA: Diagnosis not present

## 2024-07-30 DIAGNOSIS — Z131 Encounter for screening for diabetes mellitus: Secondary | ICD-10-CM | POA: Diagnosis not present

## 2024-08-05 DIAGNOSIS — R1031 Right lower quadrant pain: Secondary | ICD-10-CM | POA: Diagnosis not present

## 2024-08-13 DIAGNOSIS — N200 Calculus of kidney: Secondary | ICD-10-CM | POA: Diagnosis not present

## 2024-08-13 DIAGNOSIS — K5790 Diverticulosis of intestine, part unspecified, without perforation or abscess without bleeding: Secondary | ICD-10-CM | POA: Diagnosis not present

## 2024-08-13 DIAGNOSIS — R1031 Right lower quadrant pain: Secondary | ICD-10-CM | POA: Diagnosis not present

## 2024-09-05 DIAGNOSIS — M62838 Other muscle spasm: Secondary | ICD-10-CM | POA: Diagnosis not present

## 2024-09-05 DIAGNOSIS — K5790 Diverticulosis of intestine, part unspecified, without perforation or abscess without bleeding: Secondary | ICD-10-CM | POA: Diagnosis not present

## 2024-09-18 ENCOUNTER — Encounter: Payer: Self-pay | Admitting: Internal Medicine

## 2024-09-26 ENCOUNTER — Ambulatory Visit
Admission: EM | Admit: 2024-09-26 | Discharge: 2024-09-26 | Disposition: A | Discharge status: Home / Self Care | Attending: Nurse Practitioner | Admitting: Nurse Practitioner

## 2024-09-26 DIAGNOSIS — R6889 Other general symptoms and signs: Secondary | ICD-10-CM

## 2024-09-26 DIAGNOSIS — Z20828 Contact with and (suspected) exposure to other viral communicable diseases: Secondary | ICD-10-CM | POA: Diagnosis not present

## 2024-09-26 LAB — POCT INFLUENZA A/B
Influenza A, POC: NEGATIVE
Influenza B, POC: NEGATIVE

## 2024-09-26 MED ORDER — BENZONATATE 100 MG PO CAPS: 100.0000 mg | ORAL_CAPSULE | Freq: Three times a day (TID) | ORAL | 0 refills | Status: AC | PRN

## 2024-09-26 MED ORDER — OSELTAMIVIR PHOSPHATE 75 MG PO CAPS: 75.0000 mg | ORAL_CAPSULE | Freq: Two times a day (BID) | ORAL | 0 refills | Status: AC

## 2024-09-26 NOTE — Discharge Instructions (Addendum)
 You have a viral upper respiratory infection, likely influenza since you have been exposed.  The influenza tests are negative today.  Since your symptoms are consistent with the flu and since you have been exposed, please take the Tamiflu  twice daily for 5 days as prescribed.  Symptoms should improve over the next week to 10 days.  If you develop chest pain or shortness of breath, go to the emergency room.  Some things that can make you feel better are: - Increased rest - Increasing fluid with water /sugar free electrolytes - Acetaminophen  and ibuprofen as needed for fever/pain - Salt water  gargling, chloraseptic spray and throat lozenges - OTC guaifenesin (Mucinex) 600 mg twice daily - Saline sinus flushes or a neti pot - Humidifying the air -Tessalon  Perles every 8 hours as needed for dry cough

## 2024-09-26 NOTE — ED Triage Notes (Signed)
 Pt reports sore throat, runny nose. Headache x 1 day exposure to the flu.

## 2024-09-26 NOTE — ED Provider Notes (Signed)
 " [facility]    CSN: [id number] Arrival date & time: 09/26/24  1451      History   Chief Complaint No chief complaint on file.   HPI Tonya Love is a 49 y.o. female.   Patient presents today with 1 day history of runny nose, postnasal drainage, sore throat, headache, decreased appetite, and fatigue.  She denies fever, body aches or chills, cough, shortness of breath or chest pain, stuffy nose, ear pain, abdominal pain, vomiting, and diarrhea.  Reports one of her coworkers came to work sick with influenza.  She works at a [employer].  Has been taking NyQuil and DayQuil for symptoms with temporary minimal improvement.    Past Medical History:  Diagnosis Date   Anxiety    Chronic diarrhea    Depression    GERD (gastroesophageal reflux disease)    Helicobacter pylori gastritis 10/2021   Treated with generic Prevpac, then treated with levofloxacin , amoxicillin , and PPI BID (July 2023)   Migraines    Pancreatitis    Sleep apnea     Patient Active Problem List   Diagnosis Date Noted   Chronic diarrhea 06/24/2022   H. pylori infection 03/18/2022   Helicobacter pylori gastritis 02/24/2022   Right tennis elbow 02/23/2022   Mild sleep apnea 12/13/2021   Obesity 12/13/2021   Left flank pain 09/03/2021   Early satiety 12/18/2019   Dysphagia 08/27/2019   Anemia 08/26/2019   Diarrhea 08/26/2019   Gastroesophageal reflux disease 08/26/2019   Nausea without vomiting 08/26/2019   History of diverticulitis 08/26/2019   Rectal bleeding 08/26/2019   Anxiety state 02/11/2014    Past Surgical History:  Procedure Laterality Date   BIOPSY  11/06/2019   Procedure: BIOPSY;  Surgeon: Shaaron Lamar HERO, MD;  Location: [facility];  Service: Endoscopy;;   BIOPSY  11/12/2021   Procedure: BIOPSY;  Surgeon: Cindie Carlin POUR, DO;  Location: [facility];  Service: Endoscopy;;   BIOPSY  10/30/2023   Procedure: BIOPSY;  Surgeon: Shaaron Lamar HERO, MD;  [facility]: [facility];  Service: Endoscopy;;   CHOLECYSTECTOMY     COLONOSCOPY N/A 11/06/2019   Procedure: COLONOSCOPY;  Surgeon: Shaaron Lamar HERO, diverticulosis in the sigmoid colon, minimal grade 2 hemorrhoids, otherwise normal.  Segmental biopsies benign.  Repeat colonoscopy in 10 years.   ESOPHAGOGASTRODUODENOSCOPY N/A 11/06/2019   Procedure: ESOPHAGOGASTRODUODENOSCOPY (EGD);  Surgeon: Shaaron Lamar HERO, MD; mild Schatzki's ring s/p dilation, abnormal distal esophageal mucosa with benign biopsies, small hiatal hernia, otherwise normal.   ESOPHAGOGASTRODUODENOSCOPY (EGD) WITH PROPOFOL  N/A 11/12/2021   Surgeon: Cindie Carlin POUR, DO; small hiatal hernia, mild Schatzki's ring dilated, esophageal biopsies with reflux changes, H. pylori gastritis.  Treated with Prevpac.   ESOPHAGOGASTRODUODENOSCOPY (EGD) WITH PROPOFOL  N/A 10/30/2023   Surgeon: Shaaron Lamar HERO, MD; Mild Schatzki's ring dilated, small hiatal hernia, normal examined duodenum biopsy.  Duodenal biopsies benign.   MALONEY DILATION N/A 11/06/2019   Procedure: AGAPITO DILATION;  Surgeon: Shaaron Lamar HERO, MD;  Location: [facility];  Service: Endoscopy;  Laterality: N/A;   MALONEY DILATION N/A 10/30/2023   Procedure: AGAPITO DILATION;  Surgeon: Shaaron Lamar HERO, MD;  Location: [facility];  Service: Endoscopy;  Laterality: N/A;  8:30 am, asa 2   TUBAL LIGATION      OB History     Gravida      Para      Term      Preterm      AB  Living  2      SAB      IAB      Ectopic      Multiple      Live Births               Home Medications    Prior to Admission medications  Medication Sig Start Date End Date Taking? Authorizing Provider  benzonatate  (TESSALON ) 100 MG capsule Take 1 capsule (100 mg total) by mouth 3 (three) times daily as needed for cough. Do not take with alcohol or while operating or driving heavy machinery 04/19/72  Yes Chandra Raisin A, NP  oseltamivir  (TAMIFLU ) 75 MG capsule Take 1 capsule (75 mg  total) by mouth every 12 (twelve) hours for 5 days. 09/26/24 10/01/24 Yes Chandra Raisin LABOR, NP  acyclovir ointment (ZOVIRAX) 5 % Apply 1 Application topically every 3 (three) hours.    [provider]  cholestyramine  (QUESTRAN ) 4 g packet Take 1 packet (4 g total) by mouth daily before lunch. 02/01/24   Rudy Josette RAMAN, PA-C  cyanocobalamin  (VITAMIN B12) 1000 MCG tablet Take 1,000 mcg by mouth daily.    [provider]  ferrous sulfate 325 (65 FE) MG tablet Take 325 mg by mouth daily with breakfast.    [provider]  Fremanezumab -vfrm (AJOVY ) 225 MG/1.5ML SOAJ INJECT 1 PEN INTO THE SKIN EVERY 30 DAYS 11/09/23   Millikan, Megan, NP  gabapentin  (NEURONTIN ) 100 MG capsule Take 1 capsule (100 mg total) by mouth 2 (two) times daily. TAKE 1 CAPSULE(100 MG) BY MOUTH TWICE DAILY 11/09/23   Millikan, Megan, NP  hyoscyamine  (LEVBID ) 0.375 MG 12 hr tablet Take 1 tablet (0.375 mg total) by mouth 2 (two) times daily. 07/16/24   Rourk, Lamar HERO, MD  ondansetron  (ZOFRAN ) 4 MG tablet Take 1 tablet (4 mg total) by mouth every 8 (eight) hours as needed for nausea or vomiting. 10/06/23   Rudy Josette RAMAN, PA-C  pantoprazole  (PROTONIX ) 40 MG tablet Take 1 tablet (40 mg total) by mouth 2 (two) times daily. 08/23/23   Rudy Josette RAMAN, PA-C  rizatriptan  (MAXALT ) 10 MG tablet Take 1 tablet at the onset of migraine. May repeat in 2 hours if needed 11/09/23   Millikan, Megan, NP  valACYclovir (VALTREX) 500 MG tablet Take 500 mg by mouth daily as needed.    [provider]    Family History Family History  Problem Relation Age of Onset   Hypertension Mother    Cancer Mother    Alcohol abuse Father    Alcohol abuse Paternal Grandfather    Depression Maternal Aunt    Depression Paternal Aunt    Colon cancer Neg Hx    Pancreatic cancer Neg Hx     Social History Social History[1]   Allergies   Codeine   Review of Systems Review of Systems Per HPI  Physical Exam Triage  Vital Signs ED Triage Vitals  Encounter Vitals Group     BP 09/26/24 1503 121/76     Girls Systolic BP Percentile --      Girls Diastolic BP Percentile --      Boys Systolic BP Percentile --      Boys Diastolic BP Percentile --      Pulse Rate 09/26/24 1503 70     Resp 09/26/24 1503 18     Temp 09/26/24 1503 98.1 F (36.7 C)     Temp Source 09/26/24 1503 Oral     SpO2 09/26/24 1503 97 %  Weight --      Height --      Head Circumference --      Peak Flow --      Pain Score 09/26/24 1504 0     Pain Loc --      Pain Education --      Exclude from Growth Chart --    No data found.  Updated Vital Signs BP 121/76 (BP Location: Right Arm)   Pulse 70   Temp 98.1 F (36.7 C) (Oral)   Resp 18   LMP 09/04/2024 (Within Days)   SpO2 97%   Visual Acuity Right Eye Distance:   Left Eye Distance:   Bilateral Distance:    Right Eye Near:   Left Eye Near:    Bilateral Near:     Physical Exam Vitals and nursing note reviewed.  Constitutional:      General: She is not in acute distress.    Appearance: Normal appearance. She is not ill-appearing or toxic-appearing.  HENT:     Head: Normocephalic and atraumatic.     Right Ear: Tympanic membrane, ear canal and external ear normal.     Left Ear: Tympanic membrane, ear canal and external ear normal.     Nose: Congestion present. No rhinorrhea.     Mouth/Throat:     Mouth: Mucous membranes are moist.     Pharynx: Oropharynx is clear. Posterior oropharyngeal erythema present. No oropharyngeal exudate.  Eyes:     General: No scleral icterus.    Extraocular Movements: Extraocular movements intact.  Cardiovascular:     Rate and Rhythm: Normal rate and regular rhythm.  Pulmonary:     Effort: Pulmonary effort is normal. No respiratory distress.     Breath sounds: Normal breath sounds. No wheezing, rhonchi or rales.  Musculoskeletal:     Cervical back: Normal range of motion and neck supple.  Lymphadenopathy:     Cervical: No  cervical adenopathy.  Skin:    General: Skin is warm and dry.     Coloration: Skin is not jaundiced or pale.     Findings: No erythema or rash.  Neurological:     Mental Status: She is alert and oriented to person, place, and time.  Psychiatric:        Behavior: Behavior is cooperative.      UC Treatments / Results  Labs (all labs ordered are listed, but only abnormal results are displayed) Labs Reviewed  POCT INFLUENZA A/B - Normal    EKG   Radiology No results found.  Procedures Procedures (including critical care time)  Medications Ordered in UC Medications - No data to display  Initial Impression / Assessment and Plan / UC Course  I have reviewed the triage vital signs and the nursing notes.  Pertinent labs & imaging results that were available during my care of the patient were reviewed by me and considered in my medical decision making (see chart for details).   Patient is a pleasant, well-appearing 49 year old female presenting today for flulike symptoms.  Vital signs are stable and exam is reassuring today.  Influenza test is negative.  Given flulike symptoms and known exposure, will treat with Tamiflu  twice daily for 5 days.  Other supportive care discussed including guaifenesin, nasal saline rinses, start cough suppressant medication if needed.  Return and ER precautions discussed.  Work excuse provided.  The patient was given the opportunity to ask questions.  All questions answered to their satisfaction.  The patient is in agreement to  this plan.   Final Clinical Impressions(s) / UC Diagnoses   Final diagnoses:  Flu-like symptoms  Exposure to influenza     Discharge Instructions      You have a viral upper respiratory infection, likely influenza since you have been exposed.  The influenza tests are negative today.  Since your symptoms are consistent with the flu and since you have been exposed, please take the Tamiflu  twice daily for 5 days as  prescribed.  Symptoms should improve over the next week to 10 days.  If you develop chest pain or shortness of breath, go to the emergency room.  Some things that can make you feel better are: - Increased rest - Increasing fluid with water /sugar free electrolytes - Acetaminophen  and ibuprofen as needed for fever/pain - Salt water  gargling, chloraseptic spray and throat lozenges - OTC guaifenesin (Mucinex) 600 mg twice daily - Saline sinus flushes or a neti pot - Humidifying the air -Tessalon  Perles every 8 hours as needed for dry cough      ED Prescriptions     Medication Sig Dispense Auth. Provider   oseltamivir  (TAMIFLU ) 75 MG capsule Take 1 capsule (75 mg total) by mouth every 12 (twelve) hours for 5 days. 10 capsule Chandra Raisin A, NP   benzonatate  (TESSALON ) 100 MG capsule Take 1 capsule (100 mg total) by mouth 3 (three) times daily as needed for cough. Do not take with alcohol or while operating or driving heavy machinery 21 capsule Chandra Raisin LABOR, NP      PDMP not reviewed this encounter.     [1]  Social History Tobacco Use   Smoking status: Never    Passive exposure: Past   Smokeless tobacco: Never  Vaping Use   Vaping status: Never Used  Substance Use Topics   Alcohol use: No   Drug use: No     Chandra Raisin LABOR, NP 09/26/24 1554  "

## 2024-09-27 ENCOUNTER — Ambulatory Visit: Payer: Self-pay

## 2024-10-16 ENCOUNTER — Other Ambulatory Visit: Payer: Self-pay | Admitting: Adult Health

## 2024-10-21 ENCOUNTER — Other Ambulatory Visit: Payer: Self-pay | Admitting: Adult Health

## 2024-11-12 ENCOUNTER — Telehealth: Payer: BC Managed Care – PPO | Admitting: Adult Health
